# Patient Record
Sex: Female | Born: 1960 | Race: White | Hispanic: No | Marital: Married | Smoking: Never smoker
Health system: Southern US, Community
[De-identification: ages and names within clinical notes are randomized; demographics above are authoritative.]

## PROBLEM LIST (undated history)

## (undated) DIAGNOSIS — K219 Gastro-esophageal reflux disease without esophagitis: Secondary | ICD-10-CM

## (undated) DIAGNOSIS — M720 Palmar fascial fibromatosis [Dupuytren]: Secondary | ICD-10-CM

## (undated) DIAGNOSIS — Z8679 Personal history of other diseases of the circulatory system: Secondary | ICD-10-CM

## (undated) DIAGNOSIS — E781 Pure hyperglyceridemia: Secondary | ICD-10-CM

## (undated) DIAGNOSIS — R011 Cardiac murmur, unspecified: Secondary | ICD-10-CM

## (undated) DIAGNOSIS — J302 Other seasonal allergic rhinitis: Secondary | ICD-10-CM

## (undated) DIAGNOSIS — I4891 Unspecified atrial fibrillation: Principal | ICD-10-CM

## (undated) DIAGNOSIS — G629 Polyneuropathy, unspecified: Secondary | ICD-10-CM

## (undated) DIAGNOSIS — F1910 Other psychoactive substance abuse, uncomplicated: Secondary | ICD-10-CM

## (undated) DIAGNOSIS — T4145XA Adverse effect of unspecified anesthetic, initial encounter: Secondary | ICD-10-CM

## (undated) DIAGNOSIS — F419 Anxiety disorder, unspecified: Secondary | ICD-10-CM

## (undated) DIAGNOSIS — R569 Unspecified convulsions: Secondary | ICD-10-CM

## (undated) DIAGNOSIS — T8859XA Other complications of anesthesia, initial encounter: Secondary | ICD-10-CM

## (undated) DIAGNOSIS — G43909 Migraine, unspecified, not intractable, without status migrainosus: Secondary | ICD-10-CM

## (undated) DIAGNOSIS — I10 Essential (primary) hypertension: Secondary | ICD-10-CM

## (undated) HISTORY — DX: Essential (primary) hypertension: I10

## (undated) HISTORY — DX: Gastro-esophageal reflux disease without esophagitis: K21.9

## (undated) HISTORY — DX: Cardiac murmur, unspecified: R01.1

---

## 2000-05-14 HISTORY — PX: DILATION AND CURETTAGE OF UTERUS: SHX78

## 2009-02-25 ENCOUNTER — Ambulatory Visit: Payer: Self-pay | Admitting: Internal Medicine

## 2009-02-25 DIAGNOSIS — R011 Cardiac murmur, unspecified: Secondary | ICD-10-CM | POA: Insufficient documentation

## 2009-02-25 DIAGNOSIS — K219 Gastro-esophageal reflux disease without esophagitis: Secondary | ICD-10-CM | POA: Insufficient documentation

## 2009-02-28 ENCOUNTER — Telehealth: Payer: Self-pay | Admitting: Internal Medicine

## 2009-05-03 ENCOUNTER — Telehealth: Payer: Self-pay | Admitting: Internal Medicine

## 2009-05-04 ENCOUNTER — Ambulatory Visit: Payer: Self-pay | Admitting: Internal Medicine

## 2009-05-05 ENCOUNTER — Ambulatory Visit: Payer: Self-pay | Admitting: Internal Medicine

## 2009-05-07 ENCOUNTER — Telehealth: Payer: Self-pay | Admitting: Family Medicine

## 2009-05-10 ENCOUNTER — Telehealth: Payer: Self-pay | Admitting: Internal Medicine

## 2009-05-11 ENCOUNTER — Ambulatory Visit: Payer: Self-pay | Admitting: Cardiology

## 2009-05-11 ENCOUNTER — Telehealth: Payer: Self-pay | Admitting: Internal Medicine

## 2009-08-09 ENCOUNTER — Emergency Department (HOSPITAL_COMMUNITY): Admission: EM | Admit: 2009-08-09 | Discharge: 2009-08-09 | Payer: Self-pay | Admitting: Emergency Medicine

## 2009-08-11 ENCOUNTER — Telehealth: Payer: Self-pay | Admitting: Internal Medicine

## 2009-08-17 ENCOUNTER — Ambulatory Visit: Payer: Self-pay | Admitting: Internal Medicine

## 2009-08-17 DIAGNOSIS — L500 Allergic urticaria: Secondary | ICD-10-CM | POA: Insufficient documentation

## 2010-01-05 ENCOUNTER — Telehealth: Payer: Self-pay | Admitting: Internal Medicine

## 2010-01-05 DIAGNOSIS — N946 Dysmenorrhea, unspecified: Secondary | ICD-10-CM | POA: Insufficient documentation

## 2010-05-11 ENCOUNTER — Emergency Department (HOSPITAL_COMMUNITY)
Admission: EM | Admit: 2010-05-11 | Discharge: 2010-05-11 | Payer: Self-pay | Source: Home / Self Care | Admitting: Emergency Medicine

## 2010-05-12 ENCOUNTER — Ambulatory Visit
Admission: RE | Admit: 2010-05-12 | Discharge: 2010-05-12 | Payer: Self-pay | Source: Home / Self Care | Attending: Internal Medicine | Admitting: Internal Medicine

## 2010-05-12 DIAGNOSIS — R42 Dizziness and giddiness: Secondary | ICD-10-CM | POA: Insufficient documentation

## 2010-05-12 DIAGNOSIS — N926 Irregular menstruation, unspecified: Secondary | ICD-10-CM | POA: Insufficient documentation

## 2010-05-12 LAB — CONVERTED CEMR LAB
AST: 8 units/L (ref 0–37)
Alkaline Phosphatase: 122 units/L — ABNORMAL HIGH (ref 39–117)
Bilirubin, Direct: 0.1 mg/dL (ref 0.0–0.3)
Eosinophils Relative: 1.9 % (ref 0.0–5.0)
FSH: 48.9 milliintl units/mL
Lymphocytes Relative: 22.8 % (ref 12.0–46.0)
Lymphs Abs: 1.6 10*3/uL (ref 0.7–4.0)
MCHC: 33.9 g/dL (ref 30.0–36.0)
MCV: 89.7 fL (ref 78.0–100.0)
Neutro Abs: 4.7 10*3/uL (ref 1.4–7.7)
Neutrophils Relative %: 67.5 % (ref 43.0–77.0)
Potassium: 4.1 meq/L (ref 3.5–5.1)
RDW: 14.9 % — ABNORMAL HIGH (ref 11.5–14.6)
Sodium: 137 meq/L (ref 135–145)
Total Protein: 6.9 g/dL (ref 6.0–8.3)
Transferrin: 342.5 mg/dL (ref 212.0–360.0)

## 2010-05-15 ENCOUNTER — Encounter: Payer: Self-pay | Admitting: Internal Medicine

## 2010-05-22 ENCOUNTER — Ambulatory Visit: Admit: 2010-05-22 | Payer: Self-pay | Admitting: Internal Medicine

## 2010-05-22 ENCOUNTER — Telehealth: Payer: Self-pay | Admitting: Internal Medicine

## 2010-05-24 ENCOUNTER — Ambulatory Visit: Admit: 2010-05-24 | Payer: Self-pay | Admitting: Cardiology

## 2010-06-13 NOTE — Progress Notes (Signed)
Summary: GYN REFERRAL  Phone Note Call from Patient   Summary of Call: Patient is requesting a call regarding "female" problems.  Initial call taken by: Lamar Sprinkles, CMA,  January 05, 2010 4:28 PM  Follow-up for Phone Call        Pt c/o abnormal menstrual cycles. She needs referral to GYN and prefers a female.  Follow-up by: Lamar Sprinkles, CMA,  January 05, 2010 5:25 PM  New Problems: DYSMENORRHEA (ICD-625.3)   New Problems: DYSMENORRHEA (ICD-625.3)

## 2010-06-13 NOTE — Assessment & Plan Note (Signed)
Summary: POST ER /COULD'T DO SOONER APPTS/NWS   Vital Signs:  Patient profile:   50 year old female Menstrual status:  irregular Height:      67 inches (170.18 cm) Weight:      166 pounds (75.45 kg) BMI:     26.09 O2 Sat:      98 % on Room air Temp:     97.2 degrees F (36.22 degrees C) oral Pulse rate:   72 / minute Pulse rhythm:   regular Resp:     16 per minute BP sitting:   112 / 72  (left arm) Cuff size:   large  Vitals Entered By: Brenton Grills (August 17, 2009 4:05 PM)  O2 Flow:  Room air CC: pt here for post er follow up visit/aj, Abdominal Pain   Primary Care Provider:  [name] MD  CC:  pt here for post er follow up visit/aj and Abdominal Pain.  History of Present Illness: She returns for f/up after going to the ER for hives that she thinks were caused by stress from her son. She was placed on Benadryl and Pepcid and she feels much better.  Dyspepsia History:      She has no alarm features of dyspepsia including no history of melena, hematochezia, dysphagia, persistent vomiting, or involuntary weight loss > 5%.  There is a prior history of GERD.     Preventive Screening-Counseling & Management  Alcohol-Tobacco     Alcohol drinks/day: <1     Alcohol type: beer     >5/day in last 3 mos: no     Alcohol Counseling: not indicated; use of alcohol is not excessive or problematic     Feels need to cut down: no     Feels annoyed by complaints: no     Feels guilty re: drinking: no     Needs 'eye opener' in am: no     Smoking Status: never  Current Medications (verified): 1)  Prevacid  Allergies (verified): No Known Drug Allergies  Past History:  Past Medical History: Reviewed history from 02/25/2009 and no changes required. GERD Murmur  Past Surgical History: Reviewed history from 02/25/2009 and no changes required. Denies surgical history  Family History: Reviewed history from 02/25/2009 and no changes required. Family History  Hypertension Family History of Stroke F 1st degree relative <60  Social History: Reviewed history from 02/25/2009 and no changes required. Occupation: server Married Never Smoked Alcohol use-yes Drug use-no Regular exercise-yes  Review of Systems  The patient denies anorexia, fever, weight loss, weight gain, chest pain, abdominal pain, suspicious skin lesions, difficulty walking, depression, enlarged lymph nodes, and angioedema.    Physical Exam  General:  alert, well-developed, well-nourished, well-hydrated, appropriate dress, normal appearance, cooperative to examination, and good hygiene.   Head:  normocephalic, atraumatic, no abnormalities observed, and no abnormalities palpated.   Mouth:  Oral mucosa and oropharynx without lesions or exudates.  Teeth in good repair. Neck:  supple, full ROM, and no masses.   Lungs:  Normal respiratory effort, chest expands symmetrically. Lungs are clear to auscultation, no crackles or wheezes. Heart:  Normal rate and regular rhythm. S1 and S2 normal without gallop, murmur, click, rub or other extra sounds. Abdomen:  soft, non-tender, normal bowel sounds, no distention, no masses, no hepatomegaly, and no splenomegaly.   Msk:  normal ROM, no joint tenderness, no joint swelling, no joint warmth, no redness over joints, no joint deformities, no joint instability, and no crepitation.   Pulses:  R and L carotid,radial,femoral,dorsalis pedis and posterior tibial pulses are full and equal bilaterally Extremities:  No clubbing, cyanosis, edema, or deformity noted with normal full range of motion of all joints.   Neurologic:  No cranial nerve deficits noted. Station and gait are normal. Plantar reflexes are down-going bilaterally. DTRs are symmetrical throughout. Sensory, motor and coordinative functions appear intact. Skin:  right buttocks, adjacent to the IT shows a 3 cm area of erythema, warmth, ttp with a central area of  fluctuance/induration/excoriation Cervical Nodes:  No lymphadenopathy noted Axillary Nodes:  no R axillary adenopathy and no L axillary adenopathy.   Inguinal Nodes:  no R inguinal adenopathy and no L inguinal adenopathy.   Psych:  Cognition and judgment appear intact. Alert and cooperative with normal attention span and concentration. No apparent delusions, illusions, hallucinations   Impression & Recommendations:  Problem # 1:  ALLERGIC URTICARIA (ICD-708.0) Assessment Improved  Problem # 2:  GERD (ICD-530.81) Assessment: Improved  Complete Medication List: 1)  Prevacid   Patient Instructions: 1)  Avoid foods high in acid (tomatoes, citrus juices, spicy foods). Avoid eating within two hours of lying down or before exercising. Do not over eat; try smaller more frequent meals. Elevate head of bed twelve inches when sleeping. 2)  It is important that you exercise regularly at least 20 minutes 5 times a week. If you develop chest pain, have severe difficulty breathing, or feel very tired , stop exercising immediately and seek medical attention. 3)  You need to lose weight. Consider a lower calorie diet and regular exercise.

## 2010-06-13 NOTE — Progress Notes (Signed)
Summary: prednisone  Phone Note Call from Patient Call back at [phone]   Summary of Call: Patient was seen in the ER recently and given Prednisone. Patient has one more day of the med but complains that it keeps her up all night. Please advse. Initial call taken by: Lucious Groves,  August 11, 2009 2:25 PM  Follow-up for Phone Call        take melatonin at bedtime Follow-up by: [name] MD,  August 11, 2009 2:26 PM  Additional Follow-up for Phone Call Additional follow up Details #1::        left message on voicemail to call back to office. Additional Follow-up by: Lucious Groves,  August 11, 2009 3:32 PM    Additional Follow-up for Phone Call Additional follow up Details #2::    Patient notified. Follow-up by: Lucious Groves,  August 11, 2009 4:44 PM

## 2010-06-15 NOTE — Progress Notes (Signed)
Summary: APPT?  Phone Note Call from Patient   Caller: Patient Call For: [name] MD Summary of Call: Pt received letter concerning her 05/22/10 labs, which requested pt to sched appt.  Pt is asking do she need appt and if so why.  Pt ph#:  [phone] Initial call taken by: Ivar Bury,  May 22, 2010 12:02 PM  Follow-up for Phone Call        does she want to start treatment for menopause? Follow-up by: [name] MD,  May 22, 2010 12:58 PM  Additional Follow-up for Phone Call Additional follow up Details #1::        Patient spoke with MD at husbands appt.Marland KitchenMarland KitchenAlvy Beal Archie CMA  May 23, 2010 1:09 PM

## 2010-06-15 NOTE — Letter (Signed)
Summary: Results Follow-up Letter  [facility]  [address] [place], [place] [address]   Phone: [phone]  Fax: [phone]    05/15/2010  [address] [place], [place]  [address]  Dear Ms. Edwin Cap,   The following are the results of your recent test(s):  Test     Result     Hormones     positive for menopause Iron       "low normal" CBC       normal Liver/kidney   normal   _________________________________________________________  Please call for an appointment soon _________________________________________________________ _________________________________________________________ _________________________________________________________  Sincerely,  Sanda Linger MD [facility]

## 2010-06-15 NOTE — Assessment & Plan Note (Signed)
Summary: ER FU/EYE AND EAR PAIN/NWS   Vital Signs:  Patient profile:   50 year old female Menstrual status:  irregular LMP:     03/28/2010 Height:      67 inches (170.18 cm) Weight:      157.25 pounds (71.48 kg) BMI:     24.72 O2 Sat:      97 % on Room air Temp:     98.8 degrees F (37.11 degrees C) oral Pulse rate:   77 / minute Pulse rhythm:   regular Resp:     16 per minute BP sitting:   128 / 82  (left arm) Cuff size:   regular  Vitals Entered By: Brenton Grills CMA Duncan Dull) (May 12, 2010 3:41 PM)  O2 Flow:  Room air CC: Post ER F/U, L ear pain x 2 days, HA, Abdominal Pain Is Patient Diabetic? No Pain Assessment Patient in pain? no      LMP (date): 03/28/2010     Enter LMP: 03/28/2010   Primary Care Anna-Marie Coller:  [name] MD  CC:  Post ER F/U, L ear pain x 2 days, HA, and Abdominal Pain.  History of Present Illness: She returns for f/up after a recent ER visit and tells me that she has been feeling poorly lately and is concerned that she may have a low iron level or be in menopause. She has had mood swings, hot flashes, dizziness, and night sweats. Her testing in the ER was normal. She tells me that she wants to see a cardiologist.  Dyspepsia History:      She has no alarm features of dyspepsia including no history of melena, hematochezia, dysphagia, persistent vomiting, or involuntary weight loss > 5%.  There is a prior history of GERD.  The patient does not have a prior history of documented ulcer disease.  The dominant symptom is heartburn or acid reflux.  An H-2 blocker medication is currently being taken.  She notes that the symptoms have improved with the H-2 blocker therapy.  Symptoms have not persisted after 4 weeks of H-2 blocker treatment.     Preventive Screening-Counseling & Management  Alcohol-Tobacco     Alcohol drinks/day: <1     Alcohol type: beer     >5/day in last 3 mos: no     Alcohol Counseling: not indicated; use of alcohol is not  excessive or problematic     Feels need to cut down: no     Feels annoyed by complaints: no     Feels guilty re: drinking: no     Needs 'eye opener' in am: no     Smoking Status: never     Tobacco Counseling: not indicated; no tobacco use  Hep-HIV-STD-Contraception     Hepatitis Risk: no risk noted     HIV Risk: no risk noted     STD Risk: no risk noted      Sexual History:  currently monogamous.        Drug Use:  never and no.    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 Family History Hypertension Family History of Stroke F 1st degree relative <60  Social History: Reviewed history from 02/25/2009 and no changes required. 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   CV:  Complains of lightheadness; denies chest pain or discomfort, difficulty  breathing at night, difficulty breathing while lying down, fainting, fatigue, leg cramps with exertion, near fainting, palpitations, shortness of breath with exertion, and swelling of feet. GU:  Complains of decreased libido; denies abnormal vaginal bleeding, discharge, dysuria, hematuria, incontinence, nocturia, urinary frequency, and urinary hesitancy. Heme:  Denies abnormal bruising, bleeding, enlarge lymph nodes, fevers, pallor, and skin discoloration.  Physical Exam  General:  alert, well-developed, well-nourished, well-hydrated, appropriate dress, normal appearance, cooperative to examination, and good hygiene.   Head:  normocephalic, atraumatic, no abnormalities observed, and no abnormalities palpated.   Eyes:  no icterus Ears:  R ear normal and L ear normal.   Mouth:  Oral mucosa and oropharynx without lesions or exudates.  Teeth in good repair. Neck:  supple, full ROM, and no masses.   Lungs:  Normal respiratory effort, chest expands symmetrically. Lungs are clear to auscultation, no crackles or wheezes. Heart:  normal rate, regular rhythm, no gallop, no rub, and Grade   1/6 systolic ejection murmur.   Abdomen:  soft, non-tender, normal bowel sounds, no distention, no masses, no hepatomegaly, and no splenomegaly.   Msk:  normal ROM, no joint tenderness, no joint swelling, no joint warmth, no redness over joints, no joint deformities, no joint instability, and no crepitation.   Pulses:  R and L carotid,radial,femoral,dorsalis pedis and posterior tibial pulses are full and equal bilaterally Extremities:  No clubbing, cyanosis, edema, or deformity noted with normal full range of motion of all joints.   Neurologic:  No cranial nerve deficits noted. Station and gait are normal. Plantar reflexes are down-going bilaterally. DTRs are symmetrical throughout. Sensory, motor and coordinative functions appear intact. Skin:  turgor normal, color normal, no rashes, no suspicious lesions, no ecchymoses,  no petechiae, and no purpura.   Cervical Nodes:  No lymphadenopathy noted Axillary Nodes:  no R axillary adenopathy and no L axillary adenopathy.   Inguinal Nodes:  no R inguinal adenopathy and no L inguinal adenopathy.   Psych:  Cognition and judgment appear intact. Alert and cooperative with normal attention span and concentration. No apparent delusions, illusions, hallucinations   Impression & Recommendations:  Problem # 1:  IRREGULAR MENSES (ICD-626.4) Assessment New  Orders: Venipuncture (16109) TLB-BMP (Basic Metabolic Panel-BMET) (80048-METABOL) TLB-CBC Platelet - w/Differential (85025-CBCD) TLB-Hepatic/Liver Function Pnl (80076-HEPATIC) TLB-TSH (Thyroid Stimulating Hormone) (84443-TSH) TLB-B12 + Folate Pnl (60454_09811-B14/NWG) TLB-IBC Pnl (Iron/FE;Transferrin) (83550-IBC) TLB-FSH (Follicle Stimulating Hormone) (83001-FSH) TLB-Luteinizing Hormone (LH) (83002-LH)  Problem # 2:  DIZZINESS (ICD-780.4) Assessment: New  Orders: Venipuncture (95621) TLB-BMP (Basic Metabolic Panel-BMET) (80048-METABOL) TLB-CBC Platelet - w/Differential (85025-CBCD) TLB-Hepatic/Liver Function Pnl (80076-HEPATIC) TLB-TSH (Thyroid Stimulating Hormone) (84443-TSH) TLB-B12 + Folate Pnl (30865_78469-G29/BMW) TLB-IBC Pnl (Iron/FE;Transferrin) (83550-IBC) TLB-FSH (Follicle Stimulating Hormone) (83001-FSH) TLB-Luteinizing Hormone (LH) (83002-LH)  Problem # 3:  GERD (ICD-530.81) Assessment: Improved  Problem # 4:  CARDIAC MURMUR, SYSTOLIC (ICD-785.2) Assessment: Unchanged  Orders: Cardiology Referral (Cardiology)  Complete Medication List: 1)  Prevacid   Patient Instructions: 1)  Please schedule a follow-up appointment in 2 months. 2)  Avoid foods high in acid (tomatoes, citrus juices, spicy foods). Avoid eating within two hours of lying down or before exercising. Do not over eat; try smaller more frequent meals. Elevate head of bed twelve inches when sleeping. 3)  It is important that you  exercise regularly at least 20 minutes 5 times a week. If you develop chest pain, have severe difficulty breathing, or feel very tired , stop exercising immediately and seek medical attention. 4)  You need to lose weight. Consider a lower calorie diet and regular exercise.    Orders Added: 1)  Venipuncture [36415] 2)  TLB-BMP (Basic Metabolic Panel-BMET) [80048-METABOL] 3)  TLB-CBC Platelet - w/Differential [85025-CBCD] 4)  TLB-Hepatic/Liver Function Pnl [80076-HEPATIC] 5)  TLB-TSH (Thyroid Stimulating Hormone) [84443-TSH] 6)  TLB-B12 + Folate Pnl [82746_82607-B12/FOL] 7)  TLB-IBC Pnl (Iron/FE;Transferrin) [83550-IBC] 8)  TLB-FSH (Follicle Stimulating Hormone) [83001-FSH] 9)  TLB-Luteinizing Hormone (LH) [83002-LH] 10)  Cardiology Referral [Cardiology] 11)  Est. Patient Level IV [16109]

## 2010-06-19 ENCOUNTER — Ambulatory Visit (INDEPENDENT_AMBULATORY_CARE_PROVIDER_SITE_OTHER): Payer: BC Managed Care – PPO | Admitting: Cardiology

## 2010-06-19 ENCOUNTER — Encounter: Payer: Self-pay | Admitting: Cardiology

## 2010-06-19 DIAGNOSIS — F419 Anxiety disorder, unspecified: Secondary | ICD-10-CM | POA: Insufficient documentation

## 2010-06-19 DIAGNOSIS — R072 Precordial pain: Secondary | ICD-10-CM

## 2010-06-19 DIAGNOSIS — R42 Dizziness and giddiness: Secondary | ICD-10-CM

## 2010-06-19 DIAGNOSIS — R0789 Other chest pain: Secondary | ICD-10-CM | POA: Insufficient documentation

## 2010-06-19 DIAGNOSIS — F411 Generalized anxiety disorder: Secondary | ICD-10-CM | POA: Insufficient documentation

## 2010-06-21 ENCOUNTER — Other Ambulatory Visit: Payer: BC Managed Care – PPO

## 2010-06-26 ENCOUNTER — Encounter: Payer: Self-pay | Admitting: Physician Assistant

## 2010-06-26 ENCOUNTER — Encounter (INDEPENDENT_AMBULATORY_CARE_PROVIDER_SITE_OTHER): Payer: BC Managed Care – PPO | Admitting: Physician Assistant

## 2010-06-26 DIAGNOSIS — I10 Essential (primary) hypertension: Secondary | ICD-10-CM | POA: Insufficient documentation

## 2010-06-29 NOTE — Assessment & Plan Note (Signed)
Summary: np6. cardiac murmur.pt has bcbs.gd/ per deborah from [facility] off...   Visit Type:  Initial Consult Primary Provider:  [name] MD  CC:  chest tightness.  History of Present Illness: The patient presents for evaluation of chest pain.  She was in the ER in late December with some vague dizziness and chest discomfort. She had negative cardiac markers and was sent home. She has had no prior cardiac history although she has a very strong family history. She has had no prior cardiovascular testing. I did review recent blood work including a Forensic scientist and TSH as well as her ER notes. Since that ER visit she has otherwise done relatively well and has had no further dizziness presyncope or syncope. She has been told she had a heart murmur although this was reported as 1/6. She has never had any evaluation of this though it has been mentioned in the past. She has had some headaches. She has had no chest pressure, neck or arm discomfort. She has no shortness of breath, PND or orthopnea.  Current Medications (verified): 1)  Prevacid 2)  Ibuprofen 200 Mg Tabs (Ibuprofen) .... 3 Tabs Daily For Headaches 3)  Ferrous Sulfate 325 (65 Fe) Mg  Tabs (Ferrous Sulfate) .... Once Daily 4)  Multivitamins   Tabs (Multiple Vitamin) .... Once Daily  Allergies (verified): No Known Drug Allergies  Past History:  Past Medical History: Reviewed history from 02/25/2009 and no changes required. GERD Murmur  Past Surgical History: None  Family History: Family History Hypertension Family History of Stroke F 1st degree relative <60 Father with CHF age 73 Mother died with CVAs/MIs died age 53 Sister with MI age 41  Social History: Reviewed history from 02/25/2009 and no changes required. 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   Vital Signs:  Patient profile:    50 year old female Menstrual status:  irregular Height:      67 inches Weight:      157 pounds BMI:     24.68 Pulse rate:   79 / minute BP sitting:   172 / 96  (left arm) Cuff size:   regular  Vitals Entered By: Hardin Negus, RMA (June 19, 2010 4:40 PM)  Physical Exam  General:  Well developed, well nourished, in no acute distress. Head:  normocephalic and atraumatic Eyes:  PERRLA/EOM intact; conjunctiva and lids normal. Mouth:  Teeth, gums and palate normal. Oral mucosa normal. Neck:  Neck supple, no JVD. No masses, thyromegaly or abnormal cervical nodes. Lungs:  Clear bilaterally to auscultation and percussion. Abdomen:  Bowel sounds positive; abdomen soft and non-tender without masses, organomegaly, or hernias noted. No hepatosplenomegaly. Msk:  Back normal, normal gait. Muscle strength and tone normal. Extremities:  No clubbing or cyanosis. Neurologic:  Alert and oriented x 3. Skin:  Intact without lesions or rashes. Cervical Nodes:  no significant adenopathy Axillary Nodes:  no significant adenopathy Inguinal Nodes:  no significant adenopathy Psych:  Normal affect.   Detailed Cardiovascular Exam  Neck    Carotids: Carotids full and equal bilaterally without bruits.      Neck Veins: Normal, no JVD.    Heart    Inspection: no deformities or lifts noted.      Palpation: normal PMI with no thrills palpable.      Auscultation: regular rate and rhythm, S1, S2 without  murmurs, rubs, gallops, or clicks.    Vascular    Abdominal Aorta: no palpable masses, pulsations, or audible bruits.      Femoral Pulses: normal femoral pulses bilaterally.      Pedal Pulses: normal pedal pulses bilaterally.      Radial Pulses: normal radial pulses bilaterally.      Peripheral Circulation: no clubbing, cyanosis, or edema noted with normal capillary refill.     EKG  Procedure date:  06/19/2010  Findings:      sinus rhythm, rate 79, axis within normal limits, intervals within  normal, no ST-T wave changes.  Impression & Recommendations:  Problem # 1:  CARDIAC MURMUR, SYSTOLIC (ICD-785.2) I actually do not appreciate a murmur today. He has been described as solid and could represent a flow murmur. I do not think further cardiovascular testing to evaluate this he suggested as she is asymptomatic.  Problem # 2:  CHEST DISCOMFORT (ICD-786.59) She has had some vague chest discomfort. She has a significant family history of early cardiovascular disease. I do not think the pretest probability of obstructive coronary disease is high but I would suggest screening with an exercise treadmill test. I have also suggested a fasting lipid profile.  Problem # 3:  ANXIETY STATE, UNSPECIFIED (ICD-300.00) The patient has a 31 year old son and a job that causes anxiety.  We discussed this at length.  At her treadmill I will prescribe exercise.  Other Orders: Treadmill (Treadmill)  Patient Instructions: 1)  Your physician recommends that you schedule a follow-up appointment as needed 2)  Your physician recommends that you return for a FASTING lipid profile: To be done at [facility] office--780.4 3)  Your physician recommends that you continue on your current medications as directed. Please refer to the Current Medication list given to you today. 4)  Your physician has requested that you have an exercise tolerance test.  For further information please visit [email or web address]  Please also follow instruction sheet, as given. 5)  Keep record of blood pressures.

## 2010-07-03 ENCOUNTER — Other Ambulatory Visit: Payer: BC Managed Care – PPO

## 2010-07-10 ENCOUNTER — Encounter (INDEPENDENT_AMBULATORY_CARE_PROVIDER_SITE_OTHER): Payer: BC Managed Care – PPO | Admitting: Physician Assistant

## 2010-07-10 ENCOUNTER — Ambulatory Visit (INDEPENDENT_AMBULATORY_CARE_PROVIDER_SITE_OTHER): Payer: BC Managed Care – PPO | Admitting: Physician Assistant

## 2010-07-10 ENCOUNTER — Encounter: Payer: Self-pay | Admitting: Physician Assistant

## 2010-07-10 ENCOUNTER — Inpatient Hospital Stay: Admission: AD | Admit: 2010-07-10 | Payer: Self-pay | Source: Ambulatory Visit | Admitting: Cardiology

## 2010-07-10 ENCOUNTER — Other Ambulatory Visit (INDEPENDENT_AMBULATORY_CARE_PROVIDER_SITE_OTHER): Payer: BC Managed Care – PPO

## 2010-07-10 ENCOUNTER — Telehealth: Payer: Self-pay | Admitting: Physician Assistant

## 2010-07-10 DIAGNOSIS — I10 Essential (primary) hypertension: Secondary | ICD-10-CM

## 2010-07-10 DIAGNOSIS — R0989 Other specified symptoms and signs involving the circulatory and respiratory systems: Secondary | ICD-10-CM

## 2010-07-10 DIAGNOSIS — R079 Chest pain, unspecified: Secondary | ICD-10-CM

## 2010-07-11 ENCOUNTER — Encounter (INDEPENDENT_AMBULATORY_CARE_PROVIDER_SITE_OTHER): Payer: Self-pay | Admitting: *Deleted

## 2010-07-11 ENCOUNTER — Encounter: Payer: Self-pay | Admitting: Physician Assistant

## 2010-07-13 ENCOUNTER — Other Ambulatory Visit: Payer: Self-pay | Admitting: Cardiology

## 2010-07-13 ENCOUNTER — Other Ambulatory Visit (INDEPENDENT_AMBULATORY_CARE_PROVIDER_SITE_OTHER): Payer: BC Managed Care – PPO

## 2010-07-13 ENCOUNTER — Encounter: Payer: Self-pay | Admitting: Cardiology

## 2010-07-13 DIAGNOSIS — K921 Melena: Secondary | ICD-10-CM | POA: Insufficient documentation

## 2010-07-13 DIAGNOSIS — I10 Essential (primary) hypertension: Secondary | ICD-10-CM | POA: Insufficient documentation

## 2010-07-13 LAB — BASIC METABOLIC PANEL
Creatinine, Ser: 0.7 mg/dL (ref 0.4–1.2)
GFR: 91.34 mL/min (ref >60.00)
Glucose, Bld: 87 mg/dL (ref 70–99)
Potassium: 3.4 mEq/L — ABNORMAL LOW (ref 3.5–5.1)
Sodium: 134 mEq/L — ABNORMAL LOW (ref 135–145)

## 2010-07-13 LAB — CBC WITH DIFFERENTIAL/PLATELET
Basophils Absolute: 0 10*3/uL (ref 0.0–0.1)
Basophils Relative: 0.5 % (ref 0.0–3.0)
Lymphocytes Relative: 22.3 % (ref 12.0–46.0)
Lymphs Abs: 1.5 10*3/uL (ref 0.7–4.0)
MCHC: 34.6 g/dL (ref 30.0–36.0)
MCV: 90.7 fl (ref 78.0–100.0)
Monocytes Absolute: 0.6 10*3/uL (ref 0.1–1.0)
RBC: 4.24 Mil/uL (ref 3.87–5.11)
RDW: 14.8 % — ABNORMAL HIGH (ref 11.5–14.6)

## 2010-07-13 LAB — APTT: aPTT: 25 s (ref 21.7–28.8)

## 2010-07-13 LAB — PROTIME-INR: Prothrombin Time: 11.1 s (ref 10.2–12.4)

## 2010-07-14 ENCOUNTER — Inpatient Hospital Stay (HOSPITAL_BASED_OUTPATIENT_CLINIC_OR_DEPARTMENT_OTHER)
Admission: RE | Admit: 2010-07-14 | Discharge: 2010-07-14 | Disposition: A | Discharge status: Home / Self Care | Payer: BC Managed Care – PPO | Source: Ambulatory Visit | Attending: Cardiology | Admitting: Cardiology

## 2010-07-14 DIAGNOSIS — I2510 Atherosclerotic heart disease of native coronary artery without angina pectoris: Secondary | ICD-10-CM

## 2010-07-14 DIAGNOSIS — R079 Chest pain, unspecified: Secondary | ICD-10-CM | POA: Insufficient documentation

## 2010-07-14 HISTORY — PX: CARDIAC CATHETERIZATION: SHX172

## 2010-07-20 NOTE — Progress Notes (Signed)
Summary: chest tightness  Phone Note Call from Patient Call back at Home Phone [phone]   Caller: Patient Summary of Call: Pt having chest tightness due to stress want to know if she should still do treadmill test Initial call taken by: Judie Grieve,  July 10, 2010 11:54 AM  Follow-up for Phone Call        Spoke with pt who states that she has been having chest  tightness today off and on for about 2 or 3 hours.  States she has been working in the house and hasn't stopped therefore she doesn't know if the discomfort would get better with rest.  She states she is under alot of stress due to something pretty serious that happened with her son this weekend.  She is tearful.  She denied any radiation, sob, n/v or  pain.  States she "can tell" her BP is elevated though she has not taken it.  Asked pt to rest to see if the chest pressure gets better.  SHe should keep her appt today for the GXT as scheduled unless her s/s change or worsen.  Pt is in agreement and will call back if anything changes. Follow-up by: Charolotte Capuchin, RN,  July 10, 2010 12:04 PM

## 2010-07-20 NOTE — Letter (Signed)
Summary: Cardiac Catheterization Instructions- [facility]  [employer], Main Office  [address]   [place], [place] [address]   Phone: [phone]  Fax: [phone]     07/11/2010 MRN: [id number]  Christine Garcia [address] [place], [place]  [address]  [place]  Dear Ms. MCEVOY,   You are scheduled for a Cardiac Catheterization on 07/14/10 (Friday) with Dr.Hochrein.  Please arrive to the 1st floor of the Heart and Vascular Center at [facility] at 11:30 am on the day of your procedure. Please do not arrive before 6:30 a.m. Call the Heart and Vascular Center at [phone] if you are unable to make your appointmnet. The Code to get into the parking garage under the building is 0300. Take the elevators to the 1st floor. You must have someone to drive you home. Someone must be with you for the first 24 hours after you arrive home. Please wear clothes that are easy to get on and off and wear slip-on shoes. Do not eat or drink after midnight except water with your medications that morning. Bring all your medications and current insurance cards with you.  XXX Make sure you take your aspirin 81 mg .  XXX You may take ALL of your medications with water that morning.  The usual length of stay after your procedure is 2 to 3 hours. This can vary.  If you have any questions, please call the office at the number listed above.   Danielle Rankin, CMA Tereso Newcomer, PA-C

## 2010-07-20 NOTE — Assessment & Plan Note (Signed)
Summary: pt added on to schedule...ok as per SW...Christine Garcia   Visit Type:  Follow-up Primary Provider:  [name] Garcia  CC:  chest tightness and sob.  History of Present Illness: Primary Cardiologist:  Christine Garcia  Christine Garcia is a 50 yo female with a h/o GERD who saw Christine Garcia recently for chest discomfort and dizziness.  She was set up for a routine treadmill test.  When she presented for this a couple of weeks ago, her blood pressure was elevated and we decided to postpone.  I placed her on Maxzide and she came back today for her treadmill test.  She called the office today with complaints of chest tightness.  She states that she's never had this before.  It started while she was cleaning her house.  It continued for a long time and finally subsided.  She's had some tightness in her left arm in the office since she's been here.  She really denies any significant shortness of breath with her discomfort.  She denies associated nausea or diaphoresis or syncope or near-syncope.  She denies orthopnea, PND or pedal edema.  She denies any dysphagia, water brash symptoms or relation to meals.  She denies pleuritic symptoms.  She denies hemoptysis.  Her blood pressure was significantly elevated when she presented to the treadmill room and her test was again postponed.  Current Medications (verified): 1)  Prevacid 2)  Ibuprofen 200 Mg Tabs (Ibuprofen) .... 3 Tabs Daily For Headaches 3)  Ferrous Sulfate 325 (65 Fe) Mg  Tabs (Ferrous Sulfate) .... Once Daily 4)  Multivitamins   Tabs (Multiple Vitamin) .... Once Daily 5)  Maxzide-25 37.5-25 Mg Tabs (Triamterene-Hctz) .... Take 1 Tablet By Mouth Once A Day  Allergies (verified): 1)  ! Pcn  Past History:  Past Medical History: Last updated: 02/25/2009 GERD Murmur  Past Surgical History: Last updated: 06/19/2010 None  Social History: Last updated: 02/25/2009 Occupation: server Married Never Smoked Alcohol use-yes Drug  use-no Regular exercise-yes  Family History: Reviewed history from 06/19/2010 and no changes required. Family History Hypertension Family History of Stroke F 1st degree relative <60 Father with CHF age 16 Mother died with CVAs/MIs died age 36 Sister with MI age 73  Review of Systems       As per  the HPI.  All other systems reviewed and negative.   Vital Signs:  Patient profile:   50 year old female Menstrual status:  irregular Height:      67 inches Weight:      152 pounds Pulse rate:   72 / minute Pulse rhythm:   regular BP sitting:   172 / 101  (left arm)  Vitals Entered By: Jacquelin Hawking, CMA (July 10, 2010 4:18 PM)  Physical Exam  General:  Well nourished, well developed in no acute distress HEENT: Normal Neck: No JVD Cardiac:  Normal S1, S2; RRR; no murmur Lungs:  Clear to auscultation bilaterally, no wheezing, rhonchi or rales Abd: Soft, nontender, no hepatomegaly Ext: No edema Vascular: No carotid  bruits; DP/PT intact Skin: Warm and dry MSK:  No deformities Lymph: No adenopathy Endocrine:  No thyromegaly Neuro: CNs 2-12 intact; nonfocal    EKG  Procedure date:  07/10/2010  Findings:      NSR HR 80 normal axis ST flattening in leads 2, 3, avF, V4-6 no significant change since previous tracing  Impression & Recommendations:  Problem # 1:  CHEST DISCOMFORT (ICD-786.59) Symptoms are concerning and she had symptoms in the  office today.  She has CRFs that include HTN and significant FHx.  We have suggested admission to the hospital for evaluation.  Dr. Jens Garcia also spoke with the patient today.  We feel she should be r/o for MI and plan on cardiac cath tomorrow.  She refuses admission.  Dr. Jens Garcia discussed with her the risks of leaving AMA and these include suffering a myocardial infarction and death.  She understands this and is willing to accept those risks.  She will sign an AMA form and this will be scanned in to her chart.  She is willing to  arrange an outpatient heart cath.  This will be arranged this week.  She will return for labs tomorrow. She has been apprised of the risks of heart catheterization.  Risks include but are not limited to death, heart attack, stroke, bleeding, infection or allergic reaction from the dye.  The patient is willing to accept these risks and proceed with catheterization.   Orders: EKG w/ Interpretation (93000)  Problem # 2:  HYPERTENSION (ICD-401.9) Uncontrolled. Add carvedilol 6.25 mg two times a day.  Check bmet tomorrow to f/u on renal fxn and K+.  Problem # 3:  GERD (ICD-530.81) Continue Prevacid.  Problem # 4:  ANXIETY STATE, UNSPECIFIED (ICD-300.00)  Patient Instructions: 1)  Your physician has requested that you have a cardiac  catheterization 07/06/10.  Cardiac catheterization is used to diagnose and/or treat various heart conditions. Doctors may recommend this procedure for a number of different reasons. The most common reason is to evaluate chest pain. Chest pain can be a symptom of coronary artery disease (CAD), and cardiac catheterization can show whether plaque is narrowing or blocking your heart's arteries. This procedure is also used to evaluate the valves, as well as measure the blood flow and oxygen levels in different parts of your heart.  For further information please visit [email or web address]  Please follow instruction sheet, as given. 2)  Your physician has recommended you make the following change in your medication: START CARVEDILOL 6.25 MG 1/2 TABLET TWICE DAILY FOR THE FIRST DAY...THEN INCREASE TO 1 WHOLE TABLET TWICE DAILY. A NEW PRESCRIPTION HAS BEEN SENT TO [facility] ON [address] FOR YOU TODAY. 3)  Your physician recommends that you return for lab work in: 07/11/10 FOR PRE-CATH LAB WORK, PT, PTT, CBC, BMP, YOU CAN COME IN ANY TIME BETWEEN 8:30 AND 4:15. Prescriptions: CARVEDILOL 6.25 MG TABS (CARVEDILOL) 1/2 tablet two times a day for the first day...then increase to 1 tablet  two times a day  #60 x 5   Entered and Authorized by:   Tereso Newcomer PA-C   Signed by:   Tereso Newcomer PA-C on 07/10/2010   Method used:   Faxed to ...       [facility]  [address]  [phone]* (retail)       [address]       [place], [place]  [address]       Ph: [phone] or [phone]       Fax: [phone]   RxID:   [id number] CARVEDILOL 6.25 MG TABS (CARVEDILOL) 1/2 tablet two times a day for the first day...then increase to 1 tablet two times a day  #90 x 11   Entered by:   Danielle Rankin, CMA   Authorized by:   Tereso Newcomer PA-C   Signed by:   Danielle Rankin, CMA on 07/10/2010   Method used:   Electronically to  [facility]  [address]  [phone]* (retail)       [address]       [place], [place]  [address]       Ph: [phone] or [phone]       Fax: [phone]   RxID:   [id number]  I corrected disp # and called pharmacy. Tereso Newcomer PA-C  July 10, 2010 6:06 PM   Appended Document: pt added on to schedule...ok as per SW...Christine Garcia As noted above I discussed the patient's chest pain with her today. She had 1-1/2 hours of chest pain earlier this morning while doing housework. She feels it may be related to stress. However given her risk factors including strong family history I recommended admission for rule out myocardial infarction as well as catheterization. I explained the risks of the procedure. I also explained the risk of undiagnosed coronary disease including myocardial infarction and death. She refused admission today and understands the above risk. We will try and arrange an outpatient cardiac catheterization for definitive evaluation. I explained the risks of the procedure.

## 2010-07-20 NOTE — Miscellaneous (Addendum)
Summary: RX  Potassium Chloride 20 Meq  Clinical Lists Changes  Medications: Added new medication of POTASSIUM CHLORIDE CRYS CR 20 MEQ CR-TABS (POTASSIUM CHLORIDE CRYS CR) take 2 tablets tonight and one daily thereafter - Signed Rx of POTASSIUM CHLORIDE CRYS CR 20 MEQ CR-TABS (POTASSIUM CHLORIDE CRYS CR) take 2 tablets tonight and one daily thereafter;  #30 x 6;  Signed;  Entered by: Charolotte Capuchin, RN;  Authorized by: Rollene Rotunda, MD, [facility];  Method used: Electronically to [facility]  [phone]*, [address] [place], [place], [place]  [address], Ph: [phone] or [phone], Fax: [phone]    Prescriptions: POTASSIUM CHLORIDE CRYS CR 20 MEQ CR-TABS (POTASSIUM CHLORIDE CRYS CR) take 2 tablets tonight and one daily thereafter  #30 x 6   Entered by:   Charolotte Capuchin, RN   Authorized by:   Rollene Rotunda, MD, [facility]   Signed by:   Charolotte Capuchin, RN on 07/13/2010   Method used:   Electronically to        [facility]  [phone]* (retail)       [address]       [place], [place]  [address]       Ph: [phone] or [phone]       Fax: [phone]   RxID:   [id number]

## 2010-07-20 NOTE — Miscellaneous (Signed)
Summary: med update  Clinical Lists Changes  Medications: Added new medication of ASPIRIN 81 MG TBEC (ASPIRIN) Take one tablet by mouth daily

## 2010-07-20 NOTE — Letter (Signed)
Summary: [employer], Main Office  [address]   [place], [place] [address]   Phone: [phone]  Fax: [phone]    Today's Date: July 10, 2010  Name of Patient: Christine Garcia  The above named patient had a medical visit today at:  am / 3 pm.  Please take this into consideration when reviewing the time away from work.    Special Instructions:  [  ] None  [  ] To be off the remainder of today, returning to the normal work / school schedule tomorrow.  [  ] To be off until the next scheduled appointment on ______________________.  [ x ] Other -The patient was instructed to be admitted to the hospital today, but she did not want to pursue this.    Sincerely yours,   Tereso Newcomer PA-C

## 2010-07-20 NOTE — Letter (Signed)
Summary: Cardiac Catheterization Instructions- [facility]  [facility] [employer], Main Office  [address]   [place], [place] [address]   Phone: [phone]  Fax: [phone]     07/11/2010 MRN: [id number]  Christine Garcia [address] [place], [place]  [address]  [place]  Dear Ms. SKLAR,   You are scheduled for a Cardiac Catheterization on 07/14/10 @ 12:30 pm with Dr.Hochrein.  Please arrive to the 1st floor of the Heart and Vascular Center at [facility] at 11:30 am on the day of your procedure. Please do not arrive before 6:30 a.m. Call the Heart and Vascular Center at [phone] if you are unable to make your appointmnet. The Code to get into the parking garage under the building is 0300. Take the elevators to the 1st floor. You must have someone to drive you home. Someone must be with you for the first 24 hours after you arrive home. Please wear clothes that are easy to get on and off and wear slip-on shoes. Do not eat or drink after midnight except water with your medications that morning. Bring all your medications and current insurance cards with you.  XXXXX Make sure you take your aspirin 81 mg .  XXXX You may take ALL of your medications with water that morning.  The usual length of stay after your procedure is 2 to 3 hours. This can vary.  If you have any questions, please call the office at the number listed above.   Danielle Rankin, CMA Tereso Newcomer, PA-C

## 2010-07-24 LAB — POCT I-STAT, CHEM 8
BUN: 14 mg/dL (ref 6–23)
Calcium, Ion: 1.1 mmol/L — ABNORMAL LOW (ref 1.12–1.32)
Chloride: 105 mEq/L (ref 96–112)
Creatinine, Ser: 0.8 mg/dL (ref 0.4–1.2)
Glucose, Bld: 91 mg/dL (ref 70–99)
HCT: 40 % (ref 36.0–46.0)
Hemoglobin: 13.6 g/dL (ref 12.0–15.0)
Potassium: 3.9 mEq/L (ref 3.5–5.1)
Sodium: 137 mEq/L (ref 135–145)
TCO2: 23 mmol/L (ref 0–100)

## 2010-07-24 LAB — POCT CARDIAC MARKERS
CKMB, poc: <1 ng/mL — ABNORMAL LOW (ref 1.0–8.0)
Myoglobin, poc: 36 ng/mL (ref 12–200)
Troponin i, poc: <0.05 ng/mL (ref 0.00–0.09)

## 2010-07-27 NOTE — Procedures (Signed)
  NAMELUN, MURO NO.:  [id number]  MEDICAL RECORD NO.:  [id number]           PATIENT TYPE:  LOCATION:                                 FACILITY:  PHYSICIAN:  Rollene Rotunda, MD, FACCDATE OF BIRTH:  08-14-1960  DATE OF PROCEDURE:  07/14/2010 DATE OF DISCHARGE:                           CARDIAC CATHETERIZATION   PRIMARY:  Sanda Linger, MD.  CARDIOLOGIST:  Rollene Rotunda, MD, [facility].  PROCEDURE:  Left heart catheterization/coronary arteriography.  INDICATIONS:  The patient with chest pain suggestive of unstable angina.  PROCEDURE NOTE:  Left heart catheterization was performed via the right femoral artery.  The artery was cannulated using the anterior wall puncture.  A #4-French arterial sheath was inserted via the modified Seldinger technique.  Preformed Judkins and pigtail catheter were utilized.  The patient tolerated the procedure well and left the lab in stable condition.  RESULTS:  Hemodynamics:  LV 157/15, AO 158/114.  Coronaries, left main was normal.  The LAD had mid long 25% stenosis.  It wrapped the apex. However, as with all of her other vessels, these were somewhat narrow caliber vessels.  The circumflex in the AV groove was normal.  A ramus intermediate was large with ostial 25% stenosis.  The right coronary artery was a dominant vessel.  There was proximal 30% stenosis which might have been related to spasm.  The PDA was small-to-moderate size and normal throughout.  Left ventriculogram, the left ventriculogram was obtained in the RAO projection.  The EF is 65% with normal wall motion.  CONCLUSION:  Nonobstructive coronary artery disease.  There was some suggestion of coronary spasm.  She does have narrow caliber vessels. However, there is clearly nothing needs to be fixed mechanically.  She will be managed medically for probable nonanginal chest pain as there is no objective evidence of ischemia.  If there is no other  etiology identified, we could consider further management for possible spasm with nitrates and calcium channel blockers going forward     Rollene Rotunda, MD, [facility]   ______________________________ Rollene Rotunda, MD, [facility]    JH/MEDQ  D:  07/14/2010  T:  07/15/2010  Job:  [id number]  cc:   Sanda Linger, MD  Electronically Signed by Rollene Rotunda MD [facility] on 07/27/2010 07:59:22 PM

## 2010-08-01 NOTE — Letter (Signed)
Summary: Catering manager Cardiology Against Medical Advice   Leaving [facility] Cardiology Against Medical Advice   Imported By: Roderic Ovens 07/24/2010 09:27:14  _____________________________________________________________________  External Attachment:    Type:   Image     Comment:   External Document

## 2010-08-03 ENCOUNTER — Encounter: Payer: Self-pay | Admitting: Cardiology

## 2010-08-07 LAB — BASIC METABOLIC PANEL
Calcium: 8.8 mg/dL (ref 8.4–10.5)
GFR calc Af Amer: >60 mL/min (ref >60)
GFR calc non Af Amer: >60 mL/min (ref >60)
Potassium: 3.7 mEq/L (ref 3.5–5.1)

## 2010-08-07 LAB — POCT CARDIAC MARKERS
Myoglobin, poc: 53.5 ng/mL (ref 12–200)
Troponin i, poc: <0.05 ng/mL (ref 0.00–0.09)

## 2010-08-07 LAB — CBC
HCT: 26 % — ABNORMAL LOW (ref 36.0–46.0)
Hemoglobin: 8.3 g/dL — ABNORMAL LOW (ref 12.0–15.0)
MCHC: 31.9 g/dL (ref 30.0–36.0)
Platelets: 348 10*3/uL (ref 150–400)
RBC: 3.84 MIL/uL — ABNORMAL LOW (ref 3.87–5.11)
RDW: 18.5 % — ABNORMAL HIGH (ref 11.5–15.5)
WBC: 6.3 10*3/uL (ref 4.0–10.5)

## 2010-08-07 LAB — URINALYSIS, ROUTINE W REFLEX MICROSCOPIC
Glucose, UA: NEGATIVE mg/dL
Hgb urine dipstick: NEGATIVE
Ketones, ur: NEGATIVE mg/dL
Specific Gravity, Urine: 1.015 (ref 1.005–1.030)
pH: 5 (ref 5.0–8.0)

## 2010-08-07 LAB — DIFFERENTIAL
Blasts: 0 %
Eosinophils Absolute: 0 10*3/uL (ref 0.0–0.7)
Eosinophils Relative: 0 % (ref 0–5)
Lymphs Abs: 1.1 10*3/uL (ref 0.7–4.0)
Monocytes Relative: 2 % — ABNORMAL LOW (ref 3–12)
Neutro Abs: 5.1 10*3/uL (ref 1.7–7.7)
nRBC: 0 /100 WBC

## 2010-08-14 ENCOUNTER — Encounter: Payer: BC Managed Care – PPO | Admitting: Cardiology

## 2010-08-16 ENCOUNTER — Encounter: Payer: Self-pay | Admitting: Cardiology

## 2010-08-29 ENCOUNTER — Encounter: Payer: Self-pay | Admitting: Cardiology

## 2010-11-19 ENCOUNTER — Other Ambulatory Visit: Payer: Self-pay | Admitting: Physician Assistant

## 2011-03-14 ENCOUNTER — Other Ambulatory Visit: Payer: Self-pay | Admitting: Cardiology

## 2011-04-23 ENCOUNTER — Telehealth: Payer: Self-pay | Admitting: Internal Medicine

## 2011-04-23 NOTE — Telephone Encounter (Signed)
The pt's husband called and left a message with triage stated his wife had a temp of 99.0 and was feeling congested.  He was asking about what to do because the pt no longer has health insurance.  I told him the office visit is 125.00 without insurance. The pt's husband then stated he would try some over the counter remedies but would call back if the symptoms became worse.    Thanks!

## 2011-08-01 ENCOUNTER — Other Ambulatory Visit: Payer: Self-pay | Admitting: Physician Assistant

## 2011-08-21 ENCOUNTER — Telehealth: Payer: Self-pay | Admitting: Cardiology

## 2011-08-21 NOTE — Telephone Encounter (Signed)
Per husbands report - pt complaining of headaches and some right sided chest tightness.  He reports she is under a lot of stress and he would like for her to be seen.  He is concerned that her bp is elevated however it has not been checked.  Pt scheduled to be seen 4/11 at 3:30 with Ward Givens

## 2011-08-21 NOTE — Telephone Encounter (Signed)
PT HAVING HEADACHES /SOME CHEST PAIN/ ABOUT 2-3 DAYS NOW, PLS CALL HUSBAND MATTHEW [phone]

## 2011-08-23 ENCOUNTER — Encounter: Payer: Self-pay | Admitting: Nurse Practitioner

## 2011-08-23 ENCOUNTER — Ambulatory Visit (INDEPENDENT_AMBULATORY_CARE_PROVIDER_SITE_OTHER): Payer: Self-pay | Admitting: Nurse Practitioner

## 2011-08-23 VITALS — BP 120/86 | HR 67 | Ht 67.0 in | Wt 146.0 lb

## 2011-08-23 DIAGNOSIS — R079 Chest pain, unspecified: Secondary | ICD-10-CM | POA: Insufficient documentation

## 2011-08-23 DIAGNOSIS — I10 Essential (primary) hypertension: Secondary | ICD-10-CM

## 2011-08-23 NOTE — Progress Notes (Signed)
Patient Name: Christine Garcia Date of Encounter: 08/23/2011  Primary Care Provider:  Sanda Linger, MD, MD Primary Cardiologist:  Shela Commons. Hochrein, MD  Patient Profile  51 y/o female w/ h/o chest pain and nonobs cath in 2012 who presents with right sided c/p.  Problem List   Past Medical History  Diagnosis Date  . GERD (gastroesophageal reflux disease)   . Murmur   . Hypertension   . Chest pain at rest     a. 07/2010 Cath: nonobs dzs, EF 65%   Past Surgical History  Procedure Date  . None   . Cardiac catheterization 07/14/2010    Allergies  Allergies  Allergen Reactions  . Penicillins     HPI  51 y/o female with the above problem list.  She had c/p last year and underwent cath revealing nonobs dzs.  She did well following that.  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  This has helped some with the stress.  In the setting of this stress, she has noted increased freq of headaches (relieved with advil) along with intermittent right sided chest discomfort, which is reproducible with palpation.  Because of this discomfort, she made this appt today.  Home Medications  Prior to Admission medications   Medication Sig Start Date End Date Taking? Authorizing Provider  aspirin 81 MG tablet Take 81 mg by mouth daily.     Yes Historical Provider, MD  carvedilol (COREG) 6.25 MG tablet Take 1 tablet (6.25 mg total) by mouth 2 (two) times daily with a meal. 08/01/11  Yes Rollene Rotunda, MD  ferrous sulfate 325 (65 FE) MG tablet Take 325 mg by mouth daily.     Yes Historical Provider, MD  ibuprofen (ADVIL,MOTRIN) 200 MG tablet Take 200 mg by mouth. 3 tablets daily for headaches    Yes Historical Provider, MD  Lansoprazole (PREVACID 24HR PO) Take 1 capsule by mouth daily.     Yes Historical Provider, MD  Nutritional Supplements (ESTROVEN PO) Take by mouth.  As directed   Yes Historical Provider, MD  potassium chloride SA (K-DUR,KLOR-CON) 20 MEQ tablet Take 20 mEq by mouth daily.     Yes Historical Provider, MD  triamterene-hydrochlorothiazide (MAXZIDE-25) 37.5-25 MG per tablet TAKE ONE TABLET BY MOUTH EVERY DAY 03/14/11  Yes Rollene Rotunda, MD   Review of Systems  Right sided chest pain and anxiety as outlined in the HPI.  No sob, n, v, dizziness, syncope, edema, early satiety, dysuria, dark stools, blood in stools, diarrhea, rash/skin changes, fevers, chills, wt loss/gain.  Otherwise all systems reviewed and negative.  Physical Exam  Blood pressure 120/86, pulse 67, height 5\' 7"  (1.702 m), weight 146 lb (66.225 kg).  General: Pleasant, NAD Psych: Normal affect. Neuro: Alert and oriented X 3. Moves all extremities spontaneously. HEENT: Normal  Neck: Supple without bruits or JVD. Lungs:  Resp regular and unlabored, CTA. Heart: RRR no s3, s4, or murmurs. Abdomen: Soft, non-tender, non-distended, BS + x 4.  Extremities: No clubbing, cyanosis or edema. DP/PT/Radials 2+ and equal bilaterally.  Accessory Clinical Findings  ECG - RSR, no acute changes.  Assessment & Plan  1.  Right sided chest pain at rest:  Atypical, focal, right sided chest pain that is reproducible with palpation.  ECG is unchanged.  Had nonobs cath a year ago.  Will not pursue additional ischemic evaluation  at this time.  Rec prn nsaid.  2. Anxiety:  Pt with a lot of work-related stress.  She thinks that this may be driving her chest discomfort.  She plans to f/u with Dr. Yetta Barre.  3.  HTN:  Stable.  Cont current regimen.  4.  Dispo:  F/u Dr. Antoine Poche prn.   Nicolasa Ducking, NP 08/23/2011, 4:18 PM

## 2011-08-23 NOTE — Patient Instructions (Signed)
Your physician recommends that you schedule a follow-up appointment as needed with Dr Antoine Poche

## 2011-10-15 ENCOUNTER — Other Ambulatory Visit: Payer: Self-pay | Admitting: Cardiology

## 2011-10-15 NOTE — Telephone Encounter (Signed)
...   Requested Prescriptions   Pending Prescriptions Disp Refills  . triamterene-hydrochlorothiazide (MAXZIDE-25) 37.5-25 MG per tablet [Pharmacy Med Name: TRIAMT/HCTZ 37.5-25 TAB] 30 tablet 6    Sig: TAKE ONE TABLET BY MOUTH EVERY DAY

## 2011-11-15 ENCOUNTER — Encounter (HOSPITAL_COMMUNITY): Payer: Self-pay

## 2011-11-15 ENCOUNTER — Emergency Department (HOSPITAL_COMMUNITY): Payer: Self-pay

## 2011-11-15 ENCOUNTER — Inpatient Hospital Stay (HOSPITAL_COMMUNITY)
Admission: EM | Admit: 2011-11-15 | Discharge: 2011-11-17 | DRG: 310 | Disposition: A | Discharge status: Home / Self Care | Payer: Self-pay | Source: Ambulatory Visit | Attending: Cardiology | Admitting: Cardiology

## 2011-11-15 DIAGNOSIS — I10 Essential (primary) hypertension: Secondary | ICD-10-CM | POA: Diagnosis present

## 2011-11-15 DIAGNOSIS — I4891 Unspecified atrial fibrillation: Principal | ICD-10-CM

## 2011-11-15 DIAGNOSIS — I4892 Unspecified atrial flutter: Secondary | ICD-10-CM | POA: Diagnosis present

## 2011-11-15 DIAGNOSIS — R778 Other specified abnormalities of plasma proteins: Secondary | ICD-10-CM

## 2011-11-15 DIAGNOSIS — I2510 Atherosclerotic heart disease of native coronary artery without angina pectoris: Secondary | ICD-10-CM | POA: Diagnosis present

## 2011-11-15 DIAGNOSIS — E876 Hypokalemia: Secondary | ICD-10-CM | POA: Diagnosis present

## 2011-11-15 DIAGNOSIS — K219 Gastro-esophageal reflux disease without esophagitis: Secondary | ICD-10-CM | POA: Diagnosis present

## 2011-11-15 DIAGNOSIS — Z79899 Other long term (current) drug therapy: Secondary | ICD-10-CM

## 2011-11-15 DIAGNOSIS — I214 Non-ST elevation (NSTEMI) myocardial infarction: Secondary | ICD-10-CM | POA: Insufficient documentation

## 2011-11-15 DIAGNOSIS — F1010 Alcohol abuse, uncomplicated: Secondary | ICD-10-CM | POA: Diagnosis present

## 2011-11-15 DIAGNOSIS — Z7982 Long term (current) use of aspirin: Secondary | ICD-10-CM

## 2011-11-15 DIAGNOSIS — E041 Nontoxic single thyroid nodule: Secondary | ICD-10-CM | POA: Diagnosis present

## 2011-11-15 HISTORY — DX: Unspecified atrial fibrillation: I48.91

## 2011-11-15 LAB — BASIC METABOLIC PANEL
Calcium: 9.8 mg/dL (ref 8.4–10.5)
GFR calc Af Amer: 70 mL/min — ABNORMAL LOW (ref >90)
GFR calc non Af Amer: 60 mL/min — ABNORMAL LOW (ref >90)
Glucose, Bld: 102 mg/dL — ABNORMAL HIGH (ref 70–99)
Sodium: 129 mEq/L — ABNORMAL LOW (ref 135–145)

## 2011-11-15 LAB — CBC WITH DIFFERENTIAL/PLATELET
Basophils Relative: 0 % (ref 0–1)
Eosinophils Absolute: 0.1 10*3/uL (ref 0.0–0.7)
Lymphs Abs: 1.8 10*3/uL (ref 0.7–4.0)
MCH: 33.3 pg (ref 26.0–34.0)
Neutrophils Relative %: 70 % (ref 43–77)
Platelets: 239 10*3/uL (ref 150–400)
RBC: 5.04 MIL/uL (ref 3.87–5.11)

## 2011-11-15 LAB — D-DIMER, QUANTITATIVE: D-Dimer, Quant: <0.22 ug/mL-FEU (ref 0.00–0.48)

## 2011-11-15 LAB — TSH: TSH: 2.147 u[IU]/mL (ref 0.350–4.500)

## 2011-11-15 LAB — CARDIAC PANEL(CRET KIN+CKTOT+MB+TROPI): Total CK: 49 U/L (ref 7–177)

## 2011-11-15 LAB — POCT I-STAT TROPONIN I: Troponin i, poc: 0.23 ng/mL (ref 0.00–0.08)

## 2011-11-15 MED ORDER — ZOLPIDEM TARTRATE 5 MG PO TABS
5.0000 mg | ORAL_TABLET | Freq: Every evening | ORAL | Status: DC | PRN
  Administered 2011-11-15 – 2011-11-16 (×2): 5 mg via ORAL
  Filled 2011-11-15 (×2): qty 1

## 2011-11-15 MED ORDER — ASPIRIN 81 MG PO TABS: 81.0000 mg | ORAL_TABLET | Freq: Every day | ORAL | Status: DC

## 2011-11-15 MED ORDER — HEPARIN (PORCINE) IN NACL 100-0.45 UNIT/ML-% IJ SOLN
900.0000 [IU]/h | INTRAMUSCULAR | Status: DC
  Administered 2011-11-15 (×2): 900 [IU]/h via INTRAVENOUS
  Filled 2011-11-15 (×2): qty 250

## 2011-11-15 MED ORDER — DILTIAZEM HCL 25 MG/5ML IV SOLN
INTRAVENOUS | Status: AC
  Administered 2011-11-15: 20 mg via INTRAVENOUS
  Filled 2011-11-15: qty 5

## 2011-11-15 MED ORDER — SODIUM CHLORIDE 0.9 % IV SOLN
INTRAVENOUS | Status: DC
  Administered 2011-11-15: 125 mL/h via INTRAVENOUS

## 2011-11-15 MED ORDER — FERROUS SULFATE 325 (65 FE) MG PO TABS
325.0000 mg | ORAL_TABLET | Freq: Every day | ORAL | Status: DC
  Administered 2011-11-16 – 2011-11-17 (×2): 325 mg via ORAL
  Filled 2011-11-15 (×2): qty 1

## 2011-11-15 MED ORDER — ACETAMINOPHEN 325 MG PO TABS
650.0000 mg | ORAL_TABLET | ORAL | Status: DC | PRN
  Administered 2011-11-15 – 2011-11-16 (×3): 650 mg via ORAL
  Filled 2011-11-15 (×3): qty 2

## 2011-11-15 MED ORDER — HEPARIN BOLUS VIA INFUSION
3000.0000 [IU] | Freq: Once | INTRAVENOUS | Status: AC
  Administered 2011-11-15: 3000 [IU] via INTRAVENOUS

## 2011-11-15 MED ORDER — DILTIAZEM HCL 50 MG/10ML IV SOLN
20.0000 mg | Freq: Once | INTRAVENOUS | Status: DC
  Administered 2011-11-15: 20 mg via INTRAVENOUS

## 2011-11-15 MED ORDER — DILTIAZEM HCL 100 MG IV SOLR
5.0000 mg/h | INTRAVENOUS | Status: DC
  Administered 2011-11-15: 5 mg/h via INTRAVENOUS
  Filled 2011-11-15: qty 100

## 2011-11-15 MED ORDER — ASPIRIN 81 MG PO CHEW
81.0000 mg | CHEWABLE_TABLET | Freq: Every day | ORAL | Status: DC
  Administered 2011-11-16 – 2011-11-17 (×2): 81 mg via ORAL
  Filled 2011-11-15 (×2): qty 1

## 2011-11-15 MED ORDER — IOHEXOL 350 MG/ML SOLN
100.0000 mL | Freq: Once | INTRAVENOUS | Status: AC | PRN
  Administered 2011-11-15: 100 mL via INTRAVENOUS

## 2011-11-15 MED ORDER — CARVEDILOL 6.25 MG PO TABS
6.2500 mg | ORAL_TABLET | Freq: Two times a day (BID) | ORAL | Status: DC
  Administered 2011-11-16 – 2011-11-17 (×3): 6.25 mg via ORAL
  Filled 2011-11-15 (×5): qty 1

## 2011-11-15 MED ORDER — NITROGLYCERIN 0.4 MG SL SUBL: 0.4000 mg | SUBLINGUAL_TABLET | SUBLINGUAL | Status: DC | PRN

## 2011-11-15 MED ORDER — PANTOPRAZOLE SODIUM 20 MG PO TBEC
20.0000 mg | DELAYED_RELEASE_TABLET | Freq: Every day | ORAL | Status: DC
  Administered 2011-11-16 – 2011-11-17 (×2): 20 mg via ORAL
  Filled 2011-11-15 (×2): qty 1

## 2011-11-15 MED ORDER — LABETALOL HCL 5 MG/ML IV SOLN
20.0000 mg | Freq: Once | INTRAVENOUS | Status: AC
  Administered 2011-11-15: 20 mg via INTRAVENOUS

## 2011-11-15 MED ORDER — LANSOPRAZOLE 15 MG PO CPDR: 15.0000 mg | DELAYED_RELEASE_CAPSULE | Freq: Every day | ORAL | Status: DC

## 2011-11-15 MED ORDER — IBUPROFEN 200 MG PO TABS
200.0000 mg | ORAL_TABLET | Freq: Three times a day (TID) | ORAL | Status: DC | PRN
  Filled 2011-11-15: qty 1

## 2011-11-15 MED ORDER — ONDANSETRON HCL 4 MG/2ML IJ SOLN: 4.0000 mg | Freq: Four times a day (QID) | INTRAMUSCULAR | Status: DC | PRN

## 2011-11-15 MED ORDER — LABETALOL HCL 5 MG/ML IV SOLN
INTRAVENOUS | Status: AC
  Filled 2011-11-15: qty 4

## 2011-11-15 NOTE — H&P (Signed)
History and Physical  Patient ID: Christine Garcia MRN: [id number], SOB: 19-May-1960 50 y.o. Date of Encounter: 11/15/2011, 5:57 PM  Primary Physician: Sanda Linger, MD Primary Cardiologist: Dr. Rollene Rotunda  Chief Complaint: palpitations, sob, back/neck pain  HPI: 51 y.o. female w/ PMHx significant for HTN and nonobstructive CAD by cath 07/2010 who presented to [facility] on 11/15/2011 with complaints of palpitations, sob, and back/neck pain.  Progressive doe over the last couple of weeks. Wednesday morning went to work then felt upper back and neck pressure/pain, more sob, felt heart racing and nausea. Went home and stayed in bed until this morning. Felt fine this morning, went to work and felt the same pain/pressure, felt weak in her legs, and more sob prompting her to present to the ED. Denies chest pain, orthopnea, edema, fevers, chills, recent illnesses, prolonged immobilization/travel. Has noted blood in her stool twice in the last two months, last time was one month ago, no abd pain or melena. Usually drinks occasionally, but has had increased ETOH intake over the last week ~7 beers/ day for three days last week. Very little caffeine intake. Increased social stress in her life recently. No tobacco or illicit drug use. Denies snoring.   EKG revealed Atrial Fibrillation 115bpm, TWI V3-6. CTA chest was without evidence of acute cardiopulmonary findings, small low density nodule of right lobe of thyroid gland. Labs are significant for poc troponin 0.23, normal DDimer, K+ 3.9, Crt 1.06, WBC 9.6, H&H 16.8/45.8. She feels significantly better, but has a headache.   Past Medical History  Diagnosis Date  . GERD (gastroesophageal reflux disease)   . Murmur   . Hypertension   . Chest pain at rest     a. 07/2010 Cath: nonobs dzs, EF 65%    07/14/10 - Cardiac Cath Hemodynamics: LV 157/15, AO 158/114. Coronaries, left main was normal. The LAD had mid long 25% stenosis. It wrapped the apex.  However, as with all of her other vessels, these were somewhat narrow caliber vessels. The circumflex in the AV groove was normal. A ramus  intermediate was large with ostial 25% stenosis. The right coronary  artery was a dominant vessel. There was proximal 30% stenosis which  might have been related to spasm. The PDA was small-to-moderate size  and normal throughout. Left ventriculogram, the left ventriculogram was  obtained in the RAO projection. The EF is 65% with normal wall motion.   CONCLUSION: Nonobstructive coronary artery disease. There was some  suggestion of coronary spasm. She does have narrow caliber vessels.  However, there is clearly nothing needs to be fixed mechanically. She  will be  managed medically for probable nonanginal chest pain as there is no  objective evidence of ischemia. If there is no other etiology   identified, we could consider further management for possible spasm with  nitrates and calcium channel blockers going forward  Surgical History:  Past Surgical History  Procedure Date  . None   . Cardiac catheterization 07/14/2010     Home Meds: Medication Sig  aspirin 81 MG tablet Take 81 mg by mouth daily.    carvedilol (COREG) 6.25 MG tablet Take 1 tablet (6.25 mg total) by mouth 2 (two) times daily with a meal.  ferrous sulfate 325 (65 FE) MG tablet Take 325 mg by mouth daily.    ibuprofen (ADVIL,MOTRIN) 200 MG tablet Take 200 mg by mouth every 8 (eight) hours as needed. for headaches  Lansoprazole (PREVACID 24HR PO) Take 1 capsule by mouth daily.  Nutritional Supplements (ESTROVEN PO) Take 1 tablet by mouth daily.   potassium chloride SA (K-DUR,KLOR-CON) 20 MEQ tablet Take 20 mEq by mouth 2 (two) times daily.   triamterene-hydrochlorothiazide (MAXZIDE-25) 37.5-25 MG per tablet TAKE ONE TABLET BY MOUTH EVERY DAY    Allergies:  Allergies  Allergen Reactions  . Penicillins     Unknown    History   Social History  . Marital Status: Married    Spouse  Name: N/A    Number of Children: N/A  . Years of Education: N/A   Occupational History  . Server    Social History Main Topics  . Smoking status: Never Smoker   . Smokeless tobacco: Never Used  . Alcohol Use: Yes     not on a regular basis  . Drug Use: No  . Sexually Active:    Other Topics Concern  . Not on file   Social History Narrative  . No narrative on file     Family History  Problem Relation Age of Onset  . Hypertension      family Hx of  . Stroke      family Hx of 1st degree relative  . Heart failure Father     CHF  . Heart attack Mother     CVA's / MI  . Heart attack Sister 67    Review of Systems: General: negative for chills, fever, night sweats or weight changes.  Cardiovascular: As per HPI Dermatological: negative for rash Respiratory: negative for cough or wheezing Urologic: negative for hematuria Abdominal: (+) nausea, bright red blood per rectum; negative for  vomiting, diarrhea, melena, or hematemesis Neurologic: negative for visual changes, syncope, or dizziness All other systems reviewed and are otherwise negative except as noted above.  Labs:   Lab Results  Component Value Date   WBC 9.6 11/15/2011   HGB 16.8* 11/15/2011   HCT 45.8 11/15/2011   MCV 90.9 11/15/2011   PLT 239 11/15/2011    Lab 11/15/11 1434  NA 129*  K 3.9  CL 89*  CO2 21  BUN 10  CREATININE 1.06  CALCIUM 9.8  GLUCOSE 102*     11/15/2011 16:08  Troponin i, poc 0.23 (HH)   Component Value Date   DDIMER <0.22 11/15/2011    Radiology/Studies:   11/15/2011 - Ct Angio Chest W/cm &/or Wo Cm Findings: Unenhanced images of the chest shows no mediastinal hematoma.  No aortic calcifications are noted.  Post contrast images demonstrate no evidence of aortic dissection. No pulmonary embolus is noted.  Ascending aorta measures 3.1 cm in diameter.  Descending aorta measures 2.4 cm in diameter.  Heart size is within normal limits.  No pericardial effusion.  There is no adenopathy.  No  hilar adenopathy.  No axillary adenopathy.  The visualized upper abdomen shows the visualized abdominal aorta to be unremarkable.  There is no periaortic leak.  Images of the lung parenchyma shows no acute infiltrate or pleural effusion.  No pulmonary edema.  No pericardial effusion.  Central airways are patent.  Images of the thoracic inlet shows a low density nodule within anterior aspect of the right thyroid gland measures 7 mm.  Low density nodule within the right lobe thyroid gland measures 6.5 mm.  IMPRESSION:  1.  There is no evidence of aortic dissection or periaortic leak. 2.  Mild ectatic ascending aorta measures 3.1 cm in diameter. Descending aorta measures 2.4 cm in diameter.  No mediastinal hematoma or adenopathy. 3.  No pulmonary embolus. 4.  No acute infiltrate or pulmonary edema. 5.  Small low density nodules right lobe of thyroid gland.  Further evaluation with thyroid gland ultrasound is recommended.      EKG: 11/15/11 @ 1530 - Atrial Fibrillation 115bpm, TWI V3-6  Physical Exam: Blood pressure 98/73, pulse 78, temperature 97.5 F (36.4 C), temperature source Oral, resp. rate 13, last menstrual period 09/15/2011, SpO2 100.00%. General: Well developed, well nourished, middle aged white female, in no acute distress. Head: Normocephalic, atraumatic, sclera non-icteric, nares are without discharge Neck: Supple. Negative for carotid bruits. JVD not elevated. Lungs: Clear bilaterally to auscultation without wheezes, rales, or rhonchi. Breathing is unlabored. Heart: Irregular with S1 S2. No murmurs, rubs, or gallops appreciated. Abdomen: Soft, non-tender, non-distended with normoactive bowel sounds. No rebound/guarding. No obvious abdominal masses. Msk:  Strength and tone appear normal for age. Extremities: No edema. No clubbing or cyanosis. Distal pedal pulses are 2+ and equal bilaterally. Neuro: Alert and oriented X 3. Moves all extremities spontaneously. Psych:  Responds to questions  appropriately with a normal affect.    ASSESSMENT AND PLAN:  51 y.o. female w/ PMHx significant for HTN and nonobstructive CAD by cath 07/2010 who presented to [facility] on 11/15/2011 with complaints of palpitations, sob, LE weakness, and back/neck pain.  1. Atrial Fibrillation w/ RVR: Patient has no prior history of atrial arrhythmias. She has been under a significant amount of stress and has had increased ETOH intake. Has felt more sob over the last couple of weeks, but started feeling palpitations and neck/back pressure yesterday morning. Presented to the ED in A.Fib w/ RVR rates 120s-130s. She is currently rate controlled on IV cardizem, but remains in a.fib. CHADS2 score 1 (HTN). Will admit, monitor on telemetry, start IV heparin, check TSH/Mg, and obtain 2D Echo. Will keep NPO after midnight for possible DCCV if she does not spontaneously convert to NSR.  2. NSTEMI: H/o nonobstructive cath 07/2010. poc troponin elevated at 0.23. Likely Type II in the setting of A.Fib w/ RVR. CTA neg for acute cardiopulmonary findings. Denies chest pain. Will cycle enzymes to assess trend. Check echo for WMAs. Repeat EKG in the am. Cont ASA, BB. Heparin as above.  3. HTN: Stable. Monitor for hypotension while on cardizem drip.  4. Thyroid nodule: Incidentally found on CTA chest. Check TSH.  Signed, Justus Duerr PA-C 11/15/2011, 5:57 PM   Attending note:  Patient seen and examined. Reviewed records and database as recorded by Ms. Hisae Decoursey. She has a history of hypertension, nonobstructive CAD with question of spasm at catheterization back in March of 2012, atypical chest pain over time. She was most recently seen in the office by Mr. Brion Aliment for Dr. Antoine Poche in April. No prior history of cardiac arrhythmia. She presents to the ER complaining of 24-48 hours of intermittent palpitations, upper back and neck discomfort. Also cites psychosocial stressors, increased alcohol intake over her baseline within the week  socially. She otherwise reports compliance with her medications.  She was noted to be in atrial fibrillation with rapid ventricular response in ER, treated with intravenous Cardizem with slowing of rate. Also looks to be intermittent atrial flutter. She reports improvement in symptoms, some mild residual back discomfort. ECG does show some T wave inversions anteriorly, initial troponin level 0.23, normal d-dimer. Chest CTA was obtained by ER staff showing some mild ectasia of the ascending aorta, no evidence of dissection, no pulmonary embolus, no infiltrates. Incidentally noted small low density nodules within right lobe of thyroid.  On examination  she is comfortable, , heart rate is in the 60s to 70s, systolic blood pressure 95-112, afebrile. Lungs are clear, cardiac exam with irregular rhythm, no significant diastolic murmur, very soft systolic murmur, no pitting edema.  Additional lab work reviewed finding potassium 3.9, creatinine 1.0, hemoglobin 16.8, platelets 239.  Plan at this point is admission for further evaluation on telemetry. Will continue aspirin, initiate intravenous heparin, and continue Cardizem infusion. CHADS2 score is 1 based on hypertension. Not clear that she will require longer-term anticoagulation, even if she ultimately needs DC cardioversion. Cycle full set of ardiac markers to assess troponin trend. Echocardiogram will be obtained. Depending on rhythm control, troponin trend, and followup echocardiogram results, determination can be made as to whether a cardioversion will be pursued during hospitalization for rapid return of sinus rhythm, versus whether additional ischemic assessment is required first. Check TSH. She will likely need further thyroid imaging. Further management by Dr. Antoine Poche. NPO after midnnight.  Jonelle Sidle, M.D., F.A.C.C.

## 2011-11-15 NOTE — ED Notes (Signed)
Pt returned to exam room from CT scan. Pt remains on cardiac monitor. Remains tachycardic. She remains alert and oriented x4. Family at bedside. Pt updated on plan of care.

## 2011-11-15 NOTE — ED Notes (Signed)
RN to CT scan with patient. She remains tachycardic. Denies chest pain. Respirations unlabored.

## 2011-11-15 NOTE — ED Provider Notes (Addendum)
History     CSN: [id number]  Arrival date & time 11/15/11  1347   First MD Initiated Contact with Patient 11/15/11 1405      Chief Complaint  Patient presents with  . Torticollis  . Back Pain    (Consider location/radiation/quality/duration/timing/severity/associated sxs/prior treatment) The history is provided by the patient. The history is limited by the condition of the patient.  the pt has hx of htn.   She does NOT smoke.   yest at work she developed sudden onset of upper back pain, lower neck pain with nausea, sob, dizziness and weakness in her legs.  She denies cp, vomiting, cough.  Sxs resolved.  Today, while at work, she developed similar sxs.  Now her upper back pain is mild like pressure.  She does not take ocps.    Past Medical History  Diagnosis Date  . GERD (gastroesophageal reflux disease)   . Murmur   . Hypertension   . Chest pain at rest     a. 07/2010 Cath: nonobs dzs, EF 65%    Past Surgical History  Procedure Date  . None   . Cardiac catheterization 07/14/2010    Family History  Problem Relation Age of Onset  . Hypertension      family Hx of  . Stroke      family Hx of 1st degree relative  . Heart failure Father     CHF  . Heart attack Mother     CVA's / MI  . Heart attack Sister 76    History  Substance Use Topics  . Smoking status: Never Smoker   . Smokeless tobacco: Never Used  . Alcohol Use: Yes     not on a regular basis    OB History    Grav Para Term Preterm Abortions TAB SAB Ect Mult Living                  Review of Systems  Constitutional: Negative for fever and chills.  HENT: Positive for neck pain.   Eyes: Negative for visual disturbance.  Respiratory: Positive for shortness of breath. Negative for cough and chest tightness.   Cardiovascular: Negative for chest pain, palpitations and leg swelling.  Gastrointestinal: Positive for nausea. Negative for vomiting and diarrhea.  Musculoskeletal: Positive for back pain.    Neurological: Positive for weakness. Negative for headaches.  Psychiatric/Behavioral: Negative for confusion.  All other systems reviewed and are negative.    Allergies  Penicillins  Home Medications   Current Outpatient Rx  Name Route Sig Dispense Refill  . ASPIRIN 81 MG PO TABS Oral Take 81 mg by mouth daily.      Marland Kitchen CARVEDILOL 6.25 MG PO TABS Oral Take 1 tablet (6.25 mg total) by mouth 2 (two) times daily with a meal. 60 tablet 5  . FERROUS SULFATE 325 (65 FE) MG PO TABS Oral Take 325 mg by mouth daily.      . IBUPROFEN 200 MG PO TABS Oral Take 200 mg by mouth. 3 tablets daily for headaches     . PREVACID 24HR PO Oral Take 1 capsule by mouth daily.      Marland Kitchen ESTROVEN PO Oral Take by mouth. As directed    . POTASSIUM CHLORIDE CRYS ER 20 MEQ PO TBCR Oral Take 20 mEq by mouth daily.      . TRIAMTERENE-HCTZ 37.5-25 MG PO TABS  TAKE ONE TABLET BY MOUTH EVERY DAY 30 tablet 6    BP 133/106  Pulse 68  Temp 97.5 F (36.4 C) (Oral)  Resp 18  SpO2 100%  Physical Exam  Nursing note and vitals reviewed. Constitutional: She is oriented to person, place, and time. She appears well-developed and well-nourished. No distress.  HENT:  Head: Normocephalic and atraumatic.  Eyes: Conjunctivae and EOM are normal.  Neck: Normal range of motion. Neck supple.  Cardiovascular: Regular rhythm.   No murmur heard.      Tachycardia Pulses diminished in all 4 extremities  Pulmonary/Chest: Effort normal and breath sounds normal. No respiratory distress.  Abdominal: Soft. She exhibits no distension. There is no tenderness.  Musculoskeletal: Normal range of motion. She exhibits no edema and no tenderness.       Tenderness across her upper back at level of shoulders  Neurological: She is alert and oriented to person, place, and time. No cranial nerve deficit.  Skin: Skin is warm and dry.  Psychiatric: She has a normal mood and affect. Thought content normal.    ED Course  Procedures (including  critical care time) Sudden onset of back pain with sob, dizziness and leg weakness in htn female.  Tachycardic. No neuro deficits or sxs now.   Concerned for aortic dissection.   Doubt neurological etio for sxs.  No hx of vomiting/diarrhea.  No sxs to suggest infection.     Labs Reviewed  BASIC METABOLIC PANEL  CBC WITH DIFFERENTIAL   No results found.   No diagnosis found.  ECG  atrial fibrillation hr 115 bmp  Nl intervals Right axis deviation Nonspecific tw changes  MDM  Back pain with tachycardia        Cheri Guppy, MD 11/15/11 1537  Cheri Guppy, MD 11/15/11 1539  Cheri Guppy, MD 11/15/11 1544

## 2011-11-15 NOTE — ED Notes (Signed)
Pt st's she feels better now that heart rate is slower.  Pt using bedpan at this time.  Pt continues to deny chest pain.

## 2011-11-15 NOTE — ED Provider Notes (Signed)
History     CSN: [id number]  Arrival date & time 11/15/11  1347   First MD Initiated Contact with Patient 11/15/11 1405      Chief Complaint  Patient presents with  . Torticollis  . Back Pain    (Consider location/radiation/quality/duration/timing/severity/associated sxs/prior treatment) HPI  Past Medical History  Diagnosis Date  . GERD (gastroesophageal reflux disease)   . Murmur   . Hypertension   . Chest pain at rest     a. 07/2010 Cath: nonobs dzs, EF 65%    Past Surgical History  Procedure Date  . None   . Cardiac catheterization 07/14/2010    Family History  Problem Relation Age of Onset  . Hypertension      family Hx of  . Stroke      family Hx of 1st degree relative  . Heart failure Father     CHF  . Heart attack Mother     CVA's / MI  . Heart attack Sister 63    History  Substance Use Topics  . Smoking status: Never Smoker   . Smokeless tobacco: Never Used  . Alcohol Use: Yes     not on a regular basis    OB History    Grav Para Term Preterm Abortions TAB SAB Ect Mult Living                  Review of Systems  Allergies  Penicillins  Home Medications   Current Outpatient Rx  Name Route Sig Dispense Refill  . ASPIRIN 81 MG PO TABS Oral Take 81 mg by mouth daily.      Marland Kitchen CARVEDILOL 6.25 MG PO TABS Oral Take 1 tablet (6.25 mg total) by mouth 2 (two) times daily with a meal. 60 tablet 5  . FERROUS SULFATE 325 (65 FE) MG PO TABS Oral Take 325 mg by mouth daily.      . IBUPROFEN 200 MG PO TABS Oral Take 200 mg by mouth. 3 tablets daily for headaches     . PREVACID 24HR PO Oral Take 1 capsule by mouth daily.      Marland Kitchen ESTROVEN PO Oral Take by mouth. As directed    . POTASSIUM CHLORIDE CRYS ER 20 MEQ PO TBCR Oral Take 20 mEq by mouth daily.      . TRIAMTERENE-HCTZ 37.5-25 MG PO TABS  TAKE ONE TABLET BY MOUTH EVERY DAY 30 tablet 6    BP 112/74  Pulse 72  Temp 97.5 F (36.4 C) (Oral)  Resp 17  SpO2 100%  LMP 09/15/2011  Physical  Exam  ED Course  Procedures (including critical care time)  Labs Reviewed  BASIC METABOLIC PANEL - Abnormal; Notable for the following:    Sodium 129 (*)     Chloride 89 (*)     Glucose, Bld 102 (*)     GFR calc non Af Amer 60 (*)     GFR calc Af Amer 70 (*)     All other components within normal limits  CBC WITH DIFFERENTIAL - Abnormal; Notable for the following:    Hemoglobin 16.8 (*)     MCHC 36.7 (*)     All other components within normal limits  D-DIMER, QUANTITATIVE   Ct Angio Chest W/cm &/or Wo Cm  11/15/2011  *RADIOLOGY REPORT*  Clinical Data: Back pain, neck pain, rule out aortic dissection  CT ANGIOGRAPHY CHEST  Technique:  Multidetector CT imaging of the chest using the standard protocol during bolus administration of  intravenous contrast. Multiplanar reconstructed images including MIPs were obtained and reviewed to evaluate the vascular anatomy.  Contrast: OMNIPAQUE IOHEXOL 350 MG/ML SOLN  Comparison: None.  Findings: Unenhanced images of the chest shows no mediastinal hematoma.  No aortic calcifications are noted.  Post contrast images demonstrate no evidence of aortic dissection. No pulmonary embolus is noted.  Ascending aorta measures 3.1 cm in diameter.  Descending aorta measures 2.4 cm in diameter.  Heart size is within normal limits.  No pericardial effusion.  There is no adenopathy.  No hilar adenopathy.  No axillary adenopathy.  The visualized upper abdomen shows the visualized abdominal aorta to be unremarkable.  There is no periaortic leak.  Images of the lung parenchyma shows no acute infiltrate or pleural effusion.  No pulmonary edema.  No pericardial effusion.  Central airways are patent.  Images of the thoracic inlet shows a low density nodule within anterior aspect of the right thyroid gland measures 7 mm.  Low density nodule within the right lobe thyroid gland measures 6.5 mm.  IMPRESSION:  1.  There is no evidence of aortic dissection or periaortic leak. 2.  Mild  ectatic ascending aorta measures 3.1 cm in diameter. Descending aorta measures 2.4 cm in diameter.  No mediastinal hematoma or adenopathy. 3.  No pulmonary embolus. 4.  No acute infiltrate or pulmonary edema. 5.  Small low density nodules right lobe of thyroid gland.  Further evaluation with thyroid gland ultrasound is recommended.  Original Report Authenticated By: Natasha Mead, M.D.     New Onset Afib with RVR   MDM  Care of patient taken over from Dr. Weldon Inches.  Christine Garcia is 51 year old who presents with shortness of breath, nausea and back and neck pain.  Initial heartrate noted to be 136 and was thought to be regular, she was evaluated and discovered to be in new onset afib with RVR, she was given 20mg  of cardizem and this has controlled her rate to 70's, but she remains in afib.  Her troponin also resulted with elevation to 0.23, there are no signs of ischemia on EKG so this is likely related to the afib.  I have spoken with Dr. Diona Browner with [facility] cardiology who will admit the patient for this.        Christine Garcia, [place] 11/15/11 [phone]

## 2011-11-15 NOTE — ED Notes (Addendum)
Pt presents to department for evaluation of neck and upper back pain. Also states shortness of breath and nausea. 7/10 pain at the time. Describes as "pressure" sensation. Also states weakness to both legs, states "my legs feel loose." Pt denies recent injury to neck/back. Respirations unlabored. Lung sounds clear and equal bilaterally. States she feels very dizzy. She is conscious alert and oriented x4. Able to move all extremities without difficulty. No neuro deficits noted.

## 2011-11-15 NOTE — ED Notes (Signed)
Onset yesterday with neck and back pain, nauseated, today felt sick and then started having the pain again, sts feels like her equillibrium is off

## 2011-11-15 NOTE — ED Notes (Signed)
Abnormal labs given to Dr. Estell Harpin

## 2011-11-15 NOTE — Progress Notes (Signed)
ANTICOAGULATION CONSULT NOTE - Initial Consult  Pharmacy Consult for Heparin Indication: chest pain/ACS  Allergies  Allergen Reactions  . Penicillins     Unknown    Patient Measurements: Height: 5\' 7"  (170.2 cm) Weight: 130 lb (58.968 kg) IBW/kg (Calculated) : 61.6  Heparin Dosing Weight: 58kg Vital Signs: Temp: 97.5 F (36.4 C) (07/04 1352) Temp src: Oral (07/04 1352) BP: 98/73 mmHg (07/04 1752) Pulse Rate: 78  (07/04 1752)  Labs:  Basename 11/15/11 1434  HGB 16.8*  HCT 45.8  PLT 239  APTT --  LABPROT --  INR --  HEPARINUNFRC --  CREATININE 1.06  CKTOTAL --  CKMB --  TROPONINI --    The CrCl is unknown because both a height and weight (above a minimum accepted value) are required for this calculation.   Medical History: Past Medical History  Diagnosis Date  . GERD (gastroesophageal reflux disease)   . Murmur   . Hypertension   . Chest pain at rest     a. 07/2010 Cath: nonobs dzs, EF 65%    Medications:   (Not in a hospital admission) But includes ASA, coreg, iron, IBU, previcid, estroven, KCl, Maxzide  Admit Complaint: 51 y.o.  female  admitted 11/15/2011 with palpitation, SOB, back/neck pain.Marland Kitchen  Pharmacy consulted to dose heparin for atrial fibrillation.  Assessment: Anticoagulation: Afib.  To start heparin, patient denies recent surgery or bleeding disorder, however notes periodic blood when she stools, bright red blood in toilet bowl for several stools, last was 1 month ago.  Cardiovascular: HTN, CAD Gastrointestinal / Nutrition: GERD   PTA Medication Issues: Home Meds Not Ordered: Follow up admission orders  Best Practices: DVT Prophylaxis:   IV heparin  Goal of Therapy:  Heparin level 0.3-0.7 units/ml Monitor platelets by anticoagulation protocol: Yes   Plan:  Give 3000 units bolus x 1 Start heparin infusion at 900 units/hr Check anti-Xa level in 6 hours and daily while on heparin Continue to monitor H&H and platelets   Thank you for  allowing pharmacy to be a part of this patients care team.  Lovenia Kim Pharm.D., BCPS Clinical Pharmacist 11/15/2011 6:40 PM Pager: [phone] Phone: [phone]

## 2011-11-16 DIAGNOSIS — I4891 Unspecified atrial fibrillation: Principal | ICD-10-CM

## 2011-11-16 DIAGNOSIS — I369 Nonrheumatic tricuspid valve disorder, unspecified: Secondary | ICD-10-CM

## 2011-11-16 LAB — BASIC METABOLIC PANEL
Calcium: 9.1 mg/dL (ref 8.4–10.5)
GFR calc non Af Amer: >90 mL/min (ref >90)
Sodium: 135 mEq/L (ref 135–145)

## 2011-11-16 LAB — CARDIAC PANEL(CRET KIN+CKTOT+MB+TROPI)
CK, MB: 3.6 ng/mL (ref 0.3–4.0)
CK, MB: 3.1 ng/mL (ref 0.3–4.0)
Relative Index: INVALID (ref 0.0–2.5)
Relative Index: INVALID (ref 0.0–2.5)
Total CK: 41 U/L (ref 7–177)
Total CK: 37 U/L (ref 7–177)
Troponin I: <0.3 ng/mL (ref <0.30)

## 2011-11-16 LAB — HEPARIN LEVEL (UNFRACTIONATED)
Heparin Unfractionated: 0.32 IU/mL (ref 0.30–0.70)
Heparin Unfractionated: 0.24 IU/mL — ABNORMAL LOW (ref 0.30–0.70)

## 2011-11-16 LAB — POTASSIUM: Potassium: 2.6 mEq/L — CL (ref 3.5–5.1)

## 2011-11-16 LAB — CBC
Hemoglobin: 15.5 g/dL — ABNORMAL HIGH (ref 12.0–15.0)
RBC: 4.65 MIL/uL (ref 3.87–5.11)

## 2011-11-16 MED ORDER — POTASSIUM CHLORIDE CRYS ER 20 MEQ PO TBCR
20.0000 meq | EXTENDED_RELEASE_TABLET | Freq: Two times a day (BID) | ORAL | Status: DC
  Administered 2011-11-16 (×2): 20 meq via ORAL
  Filled 2011-11-16: qty 1
  Filled 2011-11-16: qty 2
  Filled 2011-11-16 (×2): qty 1
  Filled 2011-11-16: qty 2

## 2011-11-16 MED ORDER — POTASSIUM CHLORIDE CRYS ER 20 MEQ PO TBCR
40.0000 meq | EXTENDED_RELEASE_TABLET | Freq: Once | ORAL | Status: AC
  Administered 2011-11-16: 40 meq via ORAL

## 2011-11-16 MED ORDER — POTASSIUM CHLORIDE CRYS ER 20 MEQ PO TBCR
40.0000 meq | EXTENDED_RELEASE_TABLET | Freq: Two times a day (BID) | ORAL | Status: AC
  Administered 2011-11-16 (×2): 40 meq via ORAL
  Filled 2011-11-16 (×2): qty 2

## 2011-11-16 MED ORDER — POTASSIUM CHLORIDE 10 MEQ/100ML IV SOLN
10.0000 meq | INTRAVENOUS | Status: AC
  Administered 2011-11-16 (×4): 10 meq via INTRAVENOUS
  Filled 2011-11-16 (×4): qty 100

## 2011-11-16 MED ORDER — POTASSIUM CHLORIDE CRYS ER 20 MEQ PO TBCR
20.0000 meq | EXTENDED_RELEASE_TABLET | Freq: Once | ORAL | Status: AC
  Administered 2011-11-16: 20 meq via ORAL

## 2011-11-16 NOTE — Progress Notes (Signed)
UR Completed.  Christine Garcia [phone] 11/16/2011

## 2011-11-16 NOTE — Progress Notes (Signed)
ANTICOAGULATION CONSULT NOTE - Follow Up Consult  Pharmacy Consult for heparin Indication: atrial fibrillation  Labs:  Basename 11/16/11 0033 11/15/11 1834 11/15/11 1434  HGB -- -- 16.8*  HCT -- -- 45.8  PLT -- -- 239  APTT -- -- --  LABPROT -- -- --  INR -- -- --  HEPARINUNFRC 0.32 -- --  CREATININE -- -- 1.06  CKTOTAL 41 49 --  CKMB 3.6 4.0 --  TROPONINI 0.32* 0.31* --   Assessment/Plan: 51yo female therapeutic on heparin with initial dosing for Afib, at low end of goal but has had some rectal bleeding in past so will not increase gtt rate for now and confirm with next CE.  Colleen Can PharmD BCPS 11/16/2011,1:24 AM

## 2011-11-16 NOTE — Progress Notes (Signed)
Lab notified nurse of critical lab: K 2.4. Dr. Mayford Knife with [facility] was notified. Nurse currently waiting for call back. Harmon Pier

## 2011-11-16 NOTE — Progress Notes (Signed)
  Echocardiogram 2D Echocardiogram has been performed.  Christine Garcia 11/16/2011, 3:30 PM

## 2011-11-16 NOTE — ED Provider Notes (Signed)
Medical screening examination/treatment/procedure(s) were conducted as a shared visit with non-physician practitioner(s) and myself.  I personally evaluated the patient during the encounter  Cheri Guppy, MD 11/16/11 [phone]

## 2011-11-16 NOTE — Progress Notes (Signed)
SUBJECTIVE:  No pain.  No SOB.  Palpitations resolved   PHYSICAL EXAM Filed Vitals:   11/15/11 1752 11/15/11 1928 11/15/11 2100 11/16/11 0404  BP: 98/73 89/74 111/81 100/68  Pulse: 78 85 76 66  Temp:   97 F (36.1 C) 97.4 F (36.3 C)  TempSrc:   Oral Oral  Resp: 13 11 16 18   Height: 5\' 7"  (1.702 m)  5\' 7"  (1.702 m)   Weight: 130 lb (58.968 kg)  140 lb 3.4 oz (63.6 kg)   SpO2: 100% 100% 100% 100%   General:  No distress Lungs:  Clear Heart:  RRR, no murmur. Abdomen:  Positive bowel sounds, no rebound no guarding Extremities:  No edema.    LABS: Lab Results  Component Value Date   CKTOTAL 37 11/16/2011   CKMB 3.1 11/16/2011   TROPONINI <0.30 11/16/2011   Results for orders placed during the hospital encounter of 11/15/11 (from the past 24 hour(s))  BASIC METABOLIC PANEL     Status: Abnormal   Collection Time   11/15/11  2:34 PM      Component Value Range   Sodium 129 (*) 135 - 145 mEq/L   Potassium 3.9  3.5 - 5.1 mEq/L   Chloride 89 (*) 96 - 112 mEq/L   CO2 21  19 - 32 mEq/L   Glucose, Bld 102 (*) 70 - 99 mg/dL   BUN 10  6 - 23 mg/dL   Creatinine, Ser 9.60  0.50 - 1.10 mg/dL   Calcium 9.8  8.4 - 45.4 mg/dL   GFR calc non Af Amer 60 (*) >90 mL/min   GFR calc Af Amer 70 (*) >90 mL/min  CBC WITH DIFFERENTIAL     Status: Abnormal   Collection Time   11/15/11  2:34 PM      Component Value Range   WBC 9.6  4.0 - 10.5 K/uL   RBC 5.04  3.87 - 5.11 MIL/uL   Hemoglobin 16.8 (*) 12.0 - 15.0 g/dL   HCT 09.8  11.9 - 14.7 %   MCV 90.9  78.0 - 100.0 fL   MCH 33.3  26.0 - 34.0 pg   MCHC 36.7 (*) 30.0 - 36.0 g/dL   RDW 82.9  56.2 - 13.0 %   Platelets 239  150 - 400 K/uL   Neutrophils Relative 70  43 - 77 %   Neutro Abs 6.8  1.7 - 7.7 K/uL   Lymphocytes Relative 19  12 - 46 %   Lymphs Abs 1.8  0.7 - 4.0 K/uL   Monocytes Relative 10  3 - 12 %   Monocytes Absolute 1.0  0.1 - 1.0 K/uL   Eosinophils Relative 1  0 - 5 %   Eosinophils Absolute 0.1  0.0 - 0.7 K/uL   Basophils  Relative 0  0 - 1 %   Basophils Absolute 0.0  0.0 - 0.1 K/uL  D-DIMER, QUANTITATIVE     Status: Normal   Collection Time   11/15/11  2:48 PM      Component Value Range   D-Dimer, Quant <0.22  0.00 - 0.48 ug/mL-FEU  POCT I-STAT TROPONIN I     Status: Abnormal   Collection Time   11/15/11  4:08 PM      Component Value Range   Troponin i, poc 0.23 (*) 0.00 - 0.08 ng/mL   Comment NOTIFIED PHYSICIAN     Comment 3           CARDIAC PANEL(CRET KIN+CKTOT+MB+TROPI)  Status: Abnormal   Collection Time   11/15/11  6:34 PM      Component Value Range   Total CK 49  7 - 177 U/L   CK, MB 4.0  0.3 - 4.0 ng/mL   Troponin I 0.31 (*) <0.30 ng/mL   Relative Index RELATIVE INDEX IS INVALID  0.0 - 2.5  TSH     Status: Normal   Collection Time   11/15/11  6:34 PM      Component Value Range   TSH 2.147  0.350 - 4.500 uIU/mL  MAGNESIUM     Status: Normal   Collection Time   11/15/11  6:34 PM      Component Value Range   Magnesium 2.1  1.5 - 2.5 mg/dL  CARDIAC PANEL(CRET KIN+CKTOT+MB+TROPI)     Status: Abnormal   Collection Time   11/16/11 12:33 AM      Component Value Range   Total CK 41  7 - 177 U/L   CK, MB 3.6  0.3 - 4.0 ng/mL   Troponin I 0.32 (*) <0.30 ng/mL   Relative Index RELATIVE INDEX IS INVALID  0.0 - 2.5  HEPARIN LEVEL (UNFRACTIONATED)     Status: Normal   Collection Time   11/16/11 12:33 AM      Component Value Range   Heparin Unfractionated 0.32  0.30 - 0.70 IU/mL  CBC     Status: Abnormal   Collection Time   11/16/11  4:50 AM      Component Value Range   WBC 5.4  4.0 - 10.5 K/uL   RBC 4.65  3.87 - 5.11 MIL/uL   Hemoglobin 15.5 (*) 12.0 - 15.0 g/dL   HCT 16.1  09.6 - 04.5 %   MCV 91.2  78.0 - 100.0 fL   MCH 33.3  26.0 - 34.0 pg   MCHC 36.6 (*) 30.0 - 36.0 g/dL   RDW 40.9  81.1 - 91.4 %   Platelets 196  150 - 400 K/uL  BASIC METABOLIC PANEL     Status: Abnormal   Collection Time   11/16/11  4:50 AM      Component Value Range   Sodium 135  135 - 145 mEq/L   Potassium 2.4 (*) 3.5 -  5.1 mEq/L   Chloride 95 (*) 96 - 112 mEq/L   CO2 22  19 - 32 mEq/L   Glucose, Bld 97  70 - 99 mg/dL   BUN 8  6 - 23 mg/dL   Creatinine, Ser 7.82  0.50 - 1.10 mg/dL   Calcium 9.1  8.4 - 95.6 mg/dL   GFR calc non Af Amer >90  >90 mL/min   GFR calc Af Amer >90  >90 mL/min  HEPARIN LEVEL (UNFRACTIONATED)     Status: Abnormal   Collection Time   11/16/11  7:40 AM      Component Value Range   Heparin Unfractionated 0.24 (*) 0.30 - 0.70 IU/mL  POTASSIUM     Status: Abnormal   Collection Time   11/16/11  7:40 AM      Component Value Range   Potassium 2.6 (*) 3.5 - 5.1 mEq/L  CARDIAC PANEL(CRET KIN+CKTOT+MB+TROPI)     Status: Normal   Collection Time   11/16/11  8:00 AM      Component Value Range   Total CK 37  7 - 177 U/L   CK, MB 3.1  0.3 - 4.0 ng/mL   Troponin I <0.30  <0.30 ng/mL   Relative Index RELATIVE INDEX  IS INVALID  0.0 - 2.5   No intake or output data in the 24 hours ending 11/16/11 0850  EKG:  NSR, prolonged QT deep T wave inversion.  11/16/2011  ASSESSMENT AND PLAN:   Atrial fibrillation:  Now in NSR.  She will not need long term anticoagulation.     Essential hypertension, benign:  BP actually running low.     Troponin Elevation: Non diagnostic for MI.  No MB elevation.  Secondary to atrial fibrillation.   Hypokalemia:  Needs much supplementation.  I suspect this and afib are related to poor diet and increased alcohol intake.  I will educate.      Rollene Rotunda 11/16/2011 8:50 AM

## 2011-11-16 NOTE — Progress Notes (Signed)
Correction, Dr. Mayford Knife [facility]) was paged about critical Potassium 2.4. Nurse still waiting for return call from Dr. Kathrynn Running, Glory Rosebush

## 2011-11-16 NOTE — Progress Notes (Signed)
Jessica with [facility] returned call about critical Potassium. And informed the nurse that she will place order for potassium. Harmon Pier

## 2011-11-17 ENCOUNTER — Encounter (HOSPITAL_COMMUNITY): Payer: Self-pay | Admitting: Nurse Practitioner

## 2011-11-17 DIAGNOSIS — R778 Other specified abnormalities of plasma proteins: Secondary | ICD-10-CM

## 2011-11-17 LAB — BASIC METABOLIC PANEL
CO2: 23 mEq/L (ref 19–32)
Calcium: 9.1 mg/dL (ref 8.4–10.5)
Chloride: 105 mEq/L (ref 96–112)
Creatinine, Ser: 0.62 mg/dL (ref 0.50–1.10)
Glucose, Bld: 98 mg/dL (ref 70–99)

## 2011-11-17 LAB — CBC
HCT: 38.2 % (ref 36.0–46.0)
Hemoglobin: 13.1 g/dL (ref 12.0–15.0)
MCH: 32.9 pg (ref 26.0–34.0)
MCV: 96 fL (ref 78.0–100.0)
RBC: 3.98 MIL/uL (ref 3.87–5.11)

## 2011-11-17 NOTE — Progress Notes (Signed)
   SUBJECTIVE:  No pain.  No SOB. No complaints   PHYSICAL EXAM Filed Vitals:   11/16/11 0404 11/16/11 1328 11/16/11 2003 11/17/11 0638  BP: 100/68 113/78 112/80 106/73  Pulse: 66 65 73 72  Temp: 97.4 F (36.3 C) 98 F (36.7 C) 98 F (36.7 C) 98 F (36.7 C)  TempSrc: Oral Oral Oral Oral  Resp: 18 18 18 18   Height:      Weight:      SpO2: 100% 100% 100% 98%   General:  No distress Lungs:  Clear Heart:  RRR, no murmur. Abdomen:  Positive bowel sounds, no rebound no guarding Extremities:  No edema.    LABS: Lab Results  Component Value Date   CKTOTAL 37 11/16/2011   CKMB 3.1 11/16/2011   TROPONINI <0.30 11/16/2011   Results for orders placed during the hospital encounter of 11/15/11 (from the past 24 hour(s))  CBC     Status: Normal   Collection Time   11/17/11  5:41 AM      Component Value Range   WBC 4.3  4.0 - 10.5 K/uL   RBC 3.98  3.87 - 5.11 MIL/uL   Hemoglobin 13.1  12.0 - 15.0 g/dL   HCT 16.1  09.6 - 04.5 %   MCV 96.0  78.0 - 100.0 fL   MCH 32.9  26.0 - 34.0 pg   MCHC 34.3  30.0 - 36.0 g/dL   RDW 40.9  81.1 - 91.4 %   Platelets 172  150 - 400 K/uL  BASIC METABOLIC PANEL     Status: Abnormal   Collection Time   11/17/11  5:41 AM      Component Value Range   Sodium 138  135 - 145 mEq/L   Potassium 5.2 (*) 3.5 - 5.1 mEq/L   Chloride 105  96 - 112 mEq/L   CO2 23  19 - 32 mEq/L   Glucose, Bld 98  70 - 99 mg/dL   BUN 6  6 - 23 mg/dL   Creatinine, Ser 7.82  0.50 - 1.10 mg/dL   Calcium 9.1  8.4 - 95.6 mg/dL   GFR calc non Af Amer >90  >90 mL/min   GFR calc Af Amer >90  >90 mL/min    Intake/Output Summary (Last 24 hours) at 11/17/11 0904 Last data filed at 11/16/11 1835  Gross per 24 hour  Intake    240 ml  Output      0 ml  Net    240 ml    EKG:  NSR, QT has shortened.  T wave inversion less prominent.  11/17/2011  ASSESSMENT AND PLAN:   Atrial fibrillation:  Now in NSR.  She will not need long term anticoagulation.     Essential hypertension, benign:  BP  actually OK.     Troponin Elevation: Non diagnostic for MI.  No MB elevation.  Secondary to atrial fibrillation.  Echo with normal wall motion.  Otherwise unremarkable echo.   Hypokalemia:  upplemented.  I suspect this and afib are related to poor diet and increased alcohol intake.  She needs to avoid the excessive beer that she was drinking.  She needs a BMET early next week. Send results to me.   Follow up with extender within 2 weeks.  Fayrene Fearing [facility] 11/17/2011 9:04 AM

## 2011-11-17 NOTE — Progress Notes (Signed)
Pt. Discharged 11/17/2011  12:14 PM Discharge instructions reviewed with patient/family. Patient/family verbalized understanding. All Rx's given. Questions answered as needed. Pt. Discharged to home with family. Patient had questions about need for appt. To evaluate thyroid nodules, unaware of nodules.  Zaron Zwiefelhofer Diane   At 12:14 PM

## 2011-11-17 NOTE — Discharge Summary (Signed)
Patient ID: Daine Croker,  MRN: [id number], DOB/AGE: 10-01-1960 51 y.o.  Admit date: 11/15/2011 Discharge date: 11/17/2011  Primary Care Provider: Sanda Linger Primary Cardiologist: J. Cariana Karge, MD  Discharge Diagnoses Principal Problem:  *Atrial fibrillation  -CHADS2=1 -> ASA Active Problems:  Essential hypertension, benign  Elevated troponin  -Peak of 0.32  Coronary atherosclerosis of native coronary artery  -Nonobstructive by cath March 2012  GERD  ETOH Abuse  Hypokalemia  Thyroid nodules  -Noted incidentally on CT this admission.  F/U thyroid u/s recommended  Allergies Allergies  Allergen Reactions  . Penicillins Hives    Procedures  CTA Chest 11/15/2011  IMPRESSION:  1. There is no evidence of aortic dissection or periaortic leak. 2. Mild ectatic ascending aorta measures 3.1 cm in diameter. Descending aorta measures 2.4 cm in diameter. No mediastinal hematoma or adenopathy. 3. No pulmonary embolus. 4. No acute infiltrate or pulmonary edema. 5. Small low density nodules right lobe of thyroid gland. Further evaluation with thyroid gland ultrasound is recommended. _____________  2D Echocardiogram 11/16/2011  Study Conclusions  Left ventricle: The cavity size was normal. Wall thickness was normal. Systolic function was normal. The estimated ejection fraction was in the range of 55% to 60%. _____________  History of Present Illness  51 year old female with the above problem list.  She was in her usual state of health until a few weeks prior to admission when she began to experience progressive dyspnea exertion. On the morning prior to admission, she noted tachypalpitations associated with upper back and neck pressure as well as nausea and dyspnea.  Symptoms persisted into July 4, and patient presented to [facility] [place] where she was found to be in atrial fibrillation with rapid ventricular response. She had mild elevation of point-of-care troponin to 0.23. CT  angiography of the chest which did not show evidence of pulmonary embolism. She was incidentally found to have low density nodules of the right lobe of the thyroid gland.  Patient was placed on IV diltiazem with improved rate control.  She did report increase stress at home and at work and has been drinking 7 beers daily for the last few weeks. She was admitted for further evaluation.  Hospital Course  On IV diltiazem, patient converted to sinus rhythm. This was subsequently discontinued her home dose of beta blocker was reinstituted. She did have very mild elevation of her troponin to 0.32. She has prior known history of nonobstructive CAD.  2-D echocardiogram was carried out and showed normal LV function without regional wall motion abnormalities. Troponin leak was felt to be secondary to rapid A. Fib. CKs and MBs remained normal. She has a CHADS2 score of 1 (hypertension). She's been maintained on aspirin therapy. She was hypokalemic on July 5 and this was supplemented. We have arranged for followup basic metabolic profile on July 10. Patient be discharged home today in good condition. She has been counseled on the importance of alcohol avoidance.  Discharge Vitals Blood pressure 106/73, pulse 72, temperature 98 F (36.7 C), temperature source Oral, resp. rate 18, height 5\' 7"  (1.702 m), weight 140 lb 3.4 oz (63.6 kg), last menstrual period 09/15/2011, SpO2 98.00%.  Filed Weights   11/15/11 1752 11/15/11 2100  Weight: 130 lb (58.968 kg) 140 lb 3.4 oz (63.6 kg)   Labs  CBC  Basename 11/17/11 0541 11/16/11 0450 11/15/11 1434  WBC 4.3 5.4 --  NEUTROABS -- -- 6.8  HGB 13.1 15.5* --  HCT 38.2 42.4 --  MCV 96.0 91.2 --  PLT  172 196 --   Basic Metabolic Panel  Basename 11/17/11 0541 11/16/11 0740 11/16/11 0450 11/15/11 1834  NA 138 -- 135 --  K 5.2* 2.6* -- --  CL 105 -- 95* --  CO2 23 -- 22 --  GLUCOSE 98 -- 97 --  BUN 6 -- 8 --  CREATININE 0.62 -- 0.70 --  CALCIUM 9.1 -- 9.1 --  MG  -- -- -- 2.1  PHOS -- -- -- --   Cardiac Enzymes  Basename 11/16/11 0800 11/16/11 0033 11/15/11 1834  CKTOTAL 37 41 49  CKMB 3.1 3.6 4.0  CKMBINDEX -- -- --  TROPONINI <0.30 0.32* 0.31*   D-Dimer  Basename 11/15/11 1448  DDIMER <0.22   Thyroid Function Tests  Basename 11/15/11 1834  TSH 2.147  T4TOTAL --  T3FREE --  THYROIDAB --    Disposition  Pt is being discharged home today in good condition.  Follow-up Plans & Appointments  Follow-up Information    Follow up with [facility] on 11/21/2011. (Lab only - Blood Chemistry - anytime between 8:30-12 or 2-4)       Follow up with Rollene Rotunda, MD. (Dr. Jenene Slicker NP or PA - we will arrange.)    Contact information:   [address] [place] [address] [phone]       Follow up with Sanda Linger, MD. (Follow-up in 1-2 wks re: thyroid nodules)    Contact information:   [address] [facility] [address] [place], [address] [place] [address] [phone]        Discharge Medications  Medication List  As of 11/17/2011 10:59 AM   TAKE these medications         aspirin 81 MG tablet   Take 81 mg by mouth daily.      carvedilol 6.25 MG tablet   Commonly known as: COREG   Take 1 tablet (6.25 mg total) by mouth 2 (two) times daily with a meal.      ESTROVEN PO   Take 1 tablet by mouth daily.      ferrous sulfate 325 (65 FE) MG tablet   Take 325 mg by mouth daily.      ibuprofen 200 MG tablet   Commonly known as: ADVIL,MOTRIN   Take 200 mg by mouth every 8 (eight) hours as needed. for headaches      potassium chloride SA 20 MEQ tablet   Commonly known as: K-DUR,KLOR-CON   Take 20 mEq by mouth 2 (two) times daily.      PREVACID 24HR PO   Take 1 capsule by mouth daily.      triamterene-hydrochlorothiazide 37.5-25 MG per tablet   Commonly known as: MAXZIDE-25   TAKE ONE TABLET BY MOUTH EVERY DAY          Outstanding Labs/Studies  F/U  BMET on 11/21/2011  We have recommended followup with primary care for consideration of ultrasound of thyroid nodules on CT this admission.  Duration of Discharge Encounter   Greater than 30 minutes including physician time.  Signed, Nicolasa Ducking NP 11/17/2011, 10:59 AM   Patient seen and examined.  Plan as discussed in my rounding note for today and outlined above. Fayrene Fearing [facility]  11/17/2011  11:22 AM

## 2011-11-19 ENCOUNTER — Encounter: Payer: Self-pay | Admitting: *Deleted

## 2011-11-19 ENCOUNTER — Telehealth: Payer: Self-pay | Admitting: Cardiology

## 2011-11-19 NOTE — Telephone Encounter (Signed)
New Problem:    Patient called in needing a release to work sent in to her job (fax: [phone]) so she can work tom,orrow.  Please call back if you have any questions.

## 2011-11-19 NOTE — Telephone Encounter (Signed)
REVIEWED PT'S  RECORDS WITH Norma Fredrickson NP  PT MAY RETURN TO WORK  ON 11-20-11 NOTE FAXED TO  NUMBER LISTED IN MESSAGE .Zack Seal

## 2011-11-21 ENCOUNTER — Ambulatory Visit (INDEPENDENT_AMBULATORY_CARE_PROVIDER_SITE_OTHER): Payer: Self-pay | Admitting: *Deleted

## 2011-11-21 DIAGNOSIS — I10 Essential (primary) hypertension: Secondary | ICD-10-CM

## 2011-11-22 LAB — BASIC METABOLIC PANEL
BUN: 11 mg/dL (ref 6–23)
CO2: 28 mEq/L (ref 19–32)
Calcium: 9.2 mg/dL (ref 8.4–10.5)
GFR: 98.71 mL/min (ref >60.00)
Glucose, Bld: 86 mg/dL (ref 70–99)
Potassium: 3.8 mEq/L (ref 3.5–5.1)

## 2011-11-26 ENCOUNTER — Encounter: Payer: Self-pay | Admitting: Physician Assistant

## 2011-12-11 ENCOUNTER — Other Ambulatory Visit: Payer: Self-pay | Admitting: Cardiology

## 2011-12-11 NOTE — Telephone Encounter (Signed)
..   Requested Prescriptions   Pending Prescriptions Disp Refills  . KLOR-CON M10 10 MEQ tablet [Pharmacy Med Name: KLOR-CON M10        TAB] 60 each 6    Sig: TAKE TWO TABLETS BY MOUTH EVERY DAY

## 2012-02-05 ENCOUNTER — Other Ambulatory Visit: Payer: Self-pay | Admitting: Cardiology

## 2012-02-05 NOTE — Telephone Encounter (Signed)
..   Requested Prescriptions   Pending Prescriptions Disp Refills  . carvedilol (COREG) 6.25 MG tablet [Pharmacy Med Name: CARVEDILOL 6.25MG    TAB] 60 tablet 4    Sig: TAKE ONE TABLET BY MOUTH TWICE DAILY WITH A MEAL

## 2012-05-17 ENCOUNTER — Other Ambulatory Visit: Payer: Self-pay | Admitting: Cardiology

## 2012-05-19 ENCOUNTER — Other Ambulatory Visit: Payer: Self-pay

## 2012-05-19 MED ORDER — TRIAMTERENE-HCTZ 37.5-25 MG PO TABS: 1.0000 | ORAL_TABLET | Freq: Every day | ORAL | Status: DC

## 2012-05-19 NOTE — Telephone Encounter (Signed)
..   Requested Prescriptions   Pending Prescriptions Disp Refills  . triamterene-hydrochlorothiazide (MAXZIDE-25) 37.5-25 MG per tablet [Pharmacy Med Name: TRIAMT/HCTZ 37.5-25 TAB] 30 tablet 5    Sig: TAKE ONE TABLET BY MOUTH EVERY DAY

## 2012-05-20 ENCOUNTER — Other Ambulatory Visit: Payer: Self-pay

## 2012-05-20 MED ORDER — TRIAMTERENE-HCTZ 37.5-25 MG PO TABS: 1.0000 | ORAL_TABLET | Freq: Every day | ORAL | Status: DC

## 2012-05-20 NOTE — Telephone Encounter (Signed)
..   Requested Prescriptions   Signed Prescriptions Disp Refills  . triamterene-hydrochlorothiazide (MAXZIDE-25) 37.5-25 MG per tablet 30 tablet 5    Sig: Take 1 each (1 tablet total) by mouth daily.    Authorizing Provider: Rollene Rotunda    Ordering User: Christella Hartigan, Francisco Ostrovsky Judie Petit

## 2012-06-14 ENCOUNTER — Emergency Department (HOSPITAL_COMMUNITY): Payer: Self-pay

## 2012-06-14 ENCOUNTER — Emergency Department (HOSPITAL_COMMUNITY)
Admission: EM | Admit: 2012-06-14 | Discharge: 2012-06-14 | Disposition: A | Discharge status: Home / Self Care | Payer: Self-pay | Attending: Emergency Medicine | Admitting: Emergency Medicine

## 2012-06-14 ENCOUNTER — Encounter (HOSPITAL_COMMUNITY): Payer: Self-pay | Admitting: Emergency Medicine

## 2012-06-14 DIAGNOSIS — Z7982 Long term (current) use of aspirin: Secondary | ICD-10-CM | POA: Insufficient documentation

## 2012-06-14 DIAGNOSIS — I10 Essential (primary) hypertension: Secondary | ICD-10-CM | POA: Insufficient documentation

## 2012-06-14 DIAGNOSIS — W1809XA Striking against other object with subsequent fall, initial encounter: Secondary | ICD-10-CM | POA: Insufficient documentation

## 2012-06-14 DIAGNOSIS — S2220XA Unspecified fracture of sternum, initial encounter for closed fracture: Secondary | ICD-10-CM | POA: Insufficient documentation

## 2012-06-14 DIAGNOSIS — Z9861 Coronary angioplasty status: Secondary | ICD-10-CM | POA: Insufficient documentation

## 2012-06-14 DIAGNOSIS — K219 Gastro-esophageal reflux disease without esophagitis: Secondary | ICD-10-CM | POA: Insufficient documentation

## 2012-06-14 DIAGNOSIS — W010XXA Fall on same level from slipping, tripping and stumbling without subsequent striking against object, initial encounter: Secondary | ICD-10-CM | POA: Insufficient documentation

## 2012-06-14 DIAGNOSIS — Y929 Unspecified place or not applicable: Secondary | ICD-10-CM | POA: Insufficient documentation

## 2012-06-14 DIAGNOSIS — Z79899 Other long term (current) drug therapy: Secondary | ICD-10-CM | POA: Insufficient documentation

## 2012-06-14 DIAGNOSIS — R011 Cardiac murmur, unspecified: Secondary | ICD-10-CM | POA: Insufficient documentation

## 2012-06-14 DIAGNOSIS — Z8673 Personal history of transient ischemic attack (TIA), and cerebral infarction without residual deficits: Secondary | ICD-10-CM | POA: Insufficient documentation

## 2012-06-14 DIAGNOSIS — Z8679 Personal history of other diseases of the circulatory system: Secondary | ICD-10-CM | POA: Insufficient documentation

## 2012-06-14 DIAGNOSIS — Y939 Activity, unspecified: Secondary | ICD-10-CM | POA: Insufficient documentation

## 2012-06-14 DIAGNOSIS — S20219A Contusion of unspecified front wall of thorax, initial encounter: Secondary | ICD-10-CM | POA: Insufficient documentation

## 2012-06-14 MED ORDER — OXYCODONE-ACETAMINOPHEN 5-325 MG PO TABS
2.0000 | ORAL_TABLET | Freq: Once | ORAL | Status: AC
  Administered 2012-06-14: 2 via ORAL
  Filled 2012-06-14: qty 2

## 2012-06-14 MED ORDER — OXYCODONE-ACETAMINOPHEN 5-325 MG PO TABS: 2.0000 | ORAL_TABLET | ORAL | Status: DC | PRN

## 2012-06-14 NOTE — ED Notes (Signed)
No changes, some improvement with pain med, rates 9/10, EDP into room at time of d/c, remains alert, NAD, calm, interactive, resps e/ guarded & unlabored. verbalizes & demonstrates understanding of IS, given IS, Rx x1, work note and f/u with PCP, husband at [facility].

## 2012-06-14 NOTE — ED Notes (Signed)
Pt states she tripped and fell in her house on Monday.  C/o difficulty taking a deep breath and pressure in center of chest since fall.  Denies any other pain.  Ecchymosis noted to chest and R side of face.  Denies LOC.  Denies neck and back pain.

## 2012-06-14 NOTE — ED Notes (Signed)
Pt alert, NAD, calm, interactive, resps guarded, unlabored & shallow, pain worse with movement, pain meds given, family at [facility], pt to xray via w/c.

## 2012-06-14 NOTE — ED Provider Notes (Addendum)
History     CSN: [id number]  Arrival date & time 06/14/12  0506   First MD Initiated Contact with Patient 06/14/12 [phone]      Chief Complaint  Patient presents with  . Fall  . Chest Pain    (Consider location/radiation/quality/duration/timing/severity/associated sxs/prior treatment) HPI 52 female presents to emergency department with complaint of anterior chest pain. Patient reports she had a mechanical fall on Monday, tripped over a rug and struck her chest on a chair. She denies any LOC, denies any headache or neck pain. She has pain with taking a deep breath, and with palpation of her chest wall. Patient has put off coming to the emergency department treatment she can take care of her at home. Pain has steadily increased. Past Medical History  Diagnosis Date  . GERD (gastroesophageal reflux disease)   . Murmur     a. 11/2011 Echo: ER 55-60%, Mild TR  . Hypertension   . Chest pain at rest     a. 07/2010 Cath: nonobs dzs, EF 65%  . Exertional dyspnea     "when I have it"  . Headache     "I get headaches quite often"  . Atrial fibrillation     a. Dx 11/2011, broke on IV Dilt.  CHADS2=1-> ASA only  . ETOH abuse     Past Surgical History  Procedure Date  . None   . Cardiac catheterization 07/14/2010  . Dilation and curettage of uterus 2002    Family History  Problem Relation Age of Onset  . Hypertension      family Hx of  . Stroke      family Hx of 1st degree relative  . Heart failure Father     CHF  . Heart attack Mother     CVA's / MI  . Heart attack Sister 50    History  Substance Use Topics  . Smoking status: Never Smoker   . Smokeless tobacco: Never Used  . Alcohol Use: 16.8 oz/week    28 Cans of beer per week     Comment: 11/15/11 "4-5 beers after work ~ q night"    OB History    Grav Para Term Preterm Abortions TAB SAB Ect Mult Living                  Review of Systems  All other systems reviewed and are negative.    Allergies   Penicillins  Home Medications   Current Outpatient Rx  Name  Route  Sig  Dispense  Refill  . ASPIRIN 81 MG PO TABS   Oral   Take 81 mg by mouth daily.           Marland Kitchen CARVEDILOL 6.25 MG PO TABS      TAKE ONE TABLET BY MOUTH TWICE DAILY WITH A MEAL   60 tablet   4   . FERROUS SULFATE 325 (65 FE) MG PO TABS   Oral   Take 325 mg by mouth daily.           . IBUPROFEN 200 MG PO TABS   Oral   Take 200 mg by mouth every 8 (eight) hours as needed. for headaches         . KLOR-CON M10 10 MEQ PO TBCR      TAKE TWO TABLETS BY MOUTH EVERY DAY   60 each   6   . PREVACID 24HR PO   Oral   Take 1 capsule by mouth daily.           Marland Kitchen  ESTROVEN PO   Oral   Take 1 tablet by mouth daily.          Marland Kitchen POTASSIUM CHLORIDE CRYS ER 20 MEQ PO TBCR   Oral   Take 20 mEq by mouth 2 (two) times daily.          . TRIAMTERENE-HCTZ 37.5-25 MG PO TABS   Oral   Take 1 each (1 tablet total) by mouth daily.   30 tablet   5     BP 136/86  Pulse 73  Temp 97.9 F (36.6 C) (Oral)  Resp 25  SpO2 100%  LMP 09/15/2011  Physical Exam  Nursing note and vitals reviewed. Constitutional: She is oriented to person, place, and time. She appears well-developed and well-nourished.  HENT:  Head: Normocephalic and atraumatic.  Nose: Nose normal.  Mouth/Throat: Oropharynx is clear and moist.  Eyes: Conjunctivae normal and EOM are normal. Pupils are equal, round, and reactive to light.  Neck: Normal range of motion. Neck supple. No JVD present. No tracheal deviation present. No thyromegaly present.  Cardiovascular: Normal rate, regular rhythm, normal heart sounds and intact distal pulses.  Exam reveals no gallop and no friction rub.   No murmur heard. Pulmonary/Chest: Effort normal and breath sounds normal. No stridor. No respiratory distress. She has no wheezes. She has no rales. She exhibits tenderness (significant tenderness to anterior chest over sternum, no crepitus, step-off. Mild ecchymosis  noted).  Abdominal: Soft. Bowel sounds are normal. She exhibits no distension and no mass. There is no tenderness. There is no rebound and no guarding.  Musculoskeletal: Normal range of motion. She exhibits no edema and no tenderness.  Lymphadenopathy:    She has no cervical adenopathy.  Neurological: She is alert and oriented to person, place, and time. She exhibits normal muscle tone. Coordination normal.  Skin: Skin is warm and dry. No rash noted. No erythema. No pallor.  Psychiatric: She has a normal mood and affect. Her behavior is normal. Judgment and thought content normal.    ED Course  Procedures (including critical care time)  Labs Reviewed - No data to display Dg Chest 2 View  06/14/2012  *RADIOLOGY REPORT*  Clinical Data: Chest pain after fall 3 days ago.  CHEST - 2 VIEW  Comparison: 08/09/2009  Findings: Mild hyperinflation.  The heart size and pulmonary vascularity are normal. The lungs appear clear and expanded without focal air space disease or consolidation. No blunting of the costophrenic angles.  No pneumothorax.  Mediastinal contours appear intact.  No significant change since the previous study.  IMPRESSION: No evidence of active pulmonary disease.   Original Report Authenticated By: Burman Nieves, M.D.    Dg Sternum  06/14/2012  *RADIOLOGY REPORT*  Clinical Data: Chest pain after fall.  STERNUM - 2+ VIEW  Comparison: CT chest 11/15/2011.  Findings: Lateral view the sternum demonstrates suggestion of cortical irregularity of the inferior sternum.  Nondisplaced fracture is suggested.  This change appears to be new since the prior CT scan.  No focal bone lesion or bone destruction.  IMPRESSION: Cortical irregularity of the distal sternum suggesting nondisplaced fracture.   Original Report Authenticated By: Burman Nieves, M.D.     Date: 06/14/2012  Rate:88  Rhythm: normal sinus rhythm  QRS Axis: right  Intervals: QT prolonged  ST/T Wave abnormalities: normal  Conduction  Disutrbances:none  Narrative Interpretation:   Old EKG Reviewed: none available    1. Chest wall contusion   2. Sternal fracture  MDM  52 year old female status post fall 6 days ago with persistent pain. Will check chest x-ray and sternal views. Will treat pain.        Olivia Mackie, MD 06/14/12 1610  Olivia Mackie, MD 06/14/12 [id number]  Olivia Mackie, MD 06/14/12 [phone]

## 2012-06-30 ENCOUNTER — Encounter: Payer: Self-pay | Admitting: Internal Medicine

## 2012-06-30 ENCOUNTER — Telehealth: Payer: Self-pay | Admitting: Internal Medicine

## 2012-06-30 ENCOUNTER — Ambulatory Visit (INDEPENDENT_AMBULATORY_CARE_PROVIDER_SITE_OTHER)
Admission: RE | Admit: 2012-06-30 | Discharge: 2012-06-30 | Disposition: A | Discharge status: Home / Self Care | Payer: Self-pay | Source: Ambulatory Visit | Attending: Internal Medicine | Admitting: Internal Medicine

## 2012-06-30 ENCOUNTER — Ambulatory Visit (INDEPENDENT_AMBULATORY_CARE_PROVIDER_SITE_OTHER): Payer: Self-pay | Admitting: Internal Medicine

## 2012-06-30 VITALS — BP 138/82 | HR 73 | Temp 97.5°F | Ht 67.0 in | Wt 144.0 lb

## 2012-06-30 DIAGNOSIS — IMO0001 Reserved for inherently not codable concepts without codable children: Secondary | ICD-10-CM

## 2012-06-30 DIAGNOSIS — S2220XG Unspecified fracture of sternum, subsequent encounter for fracture with delayed healing: Secondary | ICD-10-CM

## 2012-06-30 DIAGNOSIS — W19XXXA Unspecified fall, initial encounter: Secondary | ICD-10-CM

## 2012-06-30 MED ORDER — OXYCODONE-ACETAMINOPHEN 5-325 MG PO TABS: 2.0000 | ORAL_TABLET | ORAL | Status: DC | PRN

## 2012-06-30 NOTE — Patient Instructions (Signed)
Sternal Fracture  The sternum is the bone in the center of the front of your chest which your ribs attach to. It is also called the breastbone. The most common cause of a sternal fracture (break in the bone) is an injury. The most common injury is from a motor vehicle accident. The fracture often comes from the seatbelt or hitting the chest on the steering wheel or being forcibly bent forward (shoulders towards your knees) during an accident. It is more common in females and the elderly. The fracture of the sternum is usually not a problem if there are no other injuries. Other injuries that may happen are to the ribs, heart, lungs, and abdominal organs.  SYMPTOMS   Common complaints from a fracture of the sternum include:   Shortness of breath.   Pain with breathing or difficulty breathing.   Bruises about the chest.   Tenderness or a cracking sound at the breastbone.  DIAGNOSIS   Your caregiver may be able to tell if the sternum is broken by examining you. Other times studies such as X-ray, CAT scan, ultrasound, and nuclear medicine are used to detect a fracture.   TREATMENT    Sternal fractures usually are not serious and if displacement is minimal, no treatment is necessary.   The main concern is with damage to the surrounding structures: ribs, heart, great vessels coming from the heart, and the back bone in the chest area.   Multiple rib fractures may cause breathing difficulties.   Injury to one of the large vessels in the chest may be a threat to life and require immediate surgery.   If injury to the heart or lungs is suspected it may be necessary to stay in the hospital and be monitored.   Other injuries will be treated as needed.   If the pieces of the breastbone are out of normal position, they may need to be reduced (put back in position) and then wired in place or fixed with a plate and screws during an operation.  HOME CARE INSTRUCTIONS    Avoid strenuous activity. Be careful during  activities and avoid bumping or re-injuring the injured sternum. Activities that cause pain pull on the fracture site(s) and are best avoided if possible.   Eat a normal, well-balanced diet. Drink plenty of fluids to avoid constipation, a common side effect of pain medications.   Take deep breaths and cough several times a day, splinting the injured area with a pillow. This will help prevent pneumonia.   Do not wear a rib belt or binder for the chest unless instructed otherwise. These restrict breathing and can lead to pneumonia.   Only take over-the-counter or prescription medicines for pain, discomfort, or fever as directed by your caregiver.  SEEK MEDICAL CARE IF:   You develop a continual cough, associated with thick or bloody mucus or phlegm (sputum).  SEEK IMMEDIATE MEDICAL CARE IF:    You have a fever.   You have increasing difficulty breathing.   You feel sick to your stomach (nausea), vomit, or have abdominal pain.   You have worsening pain, not controlled with medications.   You develop pain in the tops of your shoulders (in the shoulder strap area).   You feel lightheaded or faint.   You develop chest pain or an abnormal heart beat (palpitations).   You develop pain radiating into the jaw, teeth or down the arms.  Document Released: 12/13/2003 Document Revised: 07/23/2011 Document Reviewed: 08/02/2008  [employer] Patient Information 

## 2012-06-30 NOTE — Telephone Encounter (Signed)
Returned call to patient and listed phone number, received VM that states mail box is full and cannot received additional messages. Dr. Yetta Barre is out of the office until 07/08/12. Pt will need to see another MD here in the office, closing phone not due to unavailable number.

## 2012-06-30 NOTE — Telephone Encounter (Signed)
[facility] Triage Call Report Triage Record Num: [id number] Operator: Patriciaann Clan Patient Name: Christine Garcia Call Date & Time: 06/29/2012 7:10:40PM Patient Phone: [phone] PCP: Sanda Linger Patient Gender: Female PCP Fax : Patient DOB: 1961/01/14 Practice Name: [facility] Reason for Call: Caller: Matthew/Spouse; PCP: Sanda Linger (Adults only); CB#: [phone]; Call regarding Slipped On Ice. LMP-Menopausal . Spouse states patient slipped on ice landing on her back 06/28/12. States patient landed on an asphalt surface. No loss of consciousness. Denies neck or back pain. States patient complains of soreness on back of her head. States no contusion, abrasion or laceration noted. States patient denies back pain. States patient also fell against a chair, striking her chest on 06/14/12. States patient was evaluated at [facility] ED and diagnosed with a fractured sternum. States increased pain in sternal area since fall 06/28/12. States area is tender to touch. No difficulty breathing. Patient denies headache. Triage per Head Injury and Chest Trauma Protocol. Disposition of " See Provider within 4 hours" obtained related to positive triage assessment for " Chest Trauma resulting in tenderness over a specific point on chest." Spouse states patient declines to be seen in the ED. Patient requests appt. with Dr. Yetta Barre for 06/30/12. Possible consequences of delaying care explained to spouse including possible internal injury to heart/lungs. Verbalizes understanding. Care advice given per guidelines. Call back parameters reviewed. Spouse verbalizes understanding. Spouse declines offered appt. for 06/30/12. States patient needs an afternoon appt. Spouse states he will call ofifc e02/17/14 to schedule appt. Protocol(s) Used: Head Injury Recommended Outcome per Protocol: Provide Home/Self Care Reason for Outcome: All other situations Care Advice: Apply cloth-covered ice pack to affected  area to reduce swelling and decrease pain. Apply 20 minutes on, then 20 minutes off for first 48 hours. ~ After a head or face injury, call EMS 911 if the person is unable to answer simple questions, can't waken easily, has change in behavior, vomits more than 2 hours after injury, has double vision or new onset of difficulty walking, weakness or numbness. ~ Call provider for persistent headache that is not relieved with over-the-counter medication. ~ SYMPTOM / CONDITION MANAGEMENT

## 2012-06-30 NOTE — Progress Notes (Signed)
Subjective:    Patient ID: Christine Garcia, female    DOB: Dec 14, 1960, 52 y.o.   MRN: [id number]  HPI  Pt presents to the clinic today s/p fall on ice. This occurred 3 weeks ago. She did go to the ER and was diagnosed with a sternal fracture. She was given percocet. She is still having pain in her sternum but no difficulty breathing. 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 She is concerned that the fracture is not healing because of the pain.  Review of Systems      Past Medical History  Diagnosis Date  . GERD (gastroesophageal reflux disease)   . Murmur     a. 11/2011 Echo: ER 55-60%, Mild TR  . Hypertension   . Chest pain at rest     a. 07/2010 Cath: nonobs dzs, EF 65%  . Exertional dyspnea     "when I have it"  . Headache     "I get headaches quite often"  . Atrial fibrillation     a. Dx 11/2011, broke on IV Dilt.  CHADS2=1-> ASA only  . ETOH abuse     Current Outpatient Prescriptions  Medication Sig Dispense Refill  . aspirin 81 MG tablet Take 81 mg by mouth daily.        . carvedilol (COREG) 6.25 MG tablet Take 6.25 mg by mouth 2 (two) times daily with a meal.      . ferrous sulfate 325 (65 FE) MG tablet Take 325 mg by mouth daily.        Marland Kitchen ibuprofen (ADVIL,MOTRIN) 200 MG tablet Take 200 mg by mouth every 8 (eight) hours as needed. for headaches      . Lansoprazole (PREVACID 24HR PO) Take 1 capsule by mouth daily.        Marland Kitchen oxyCODONE-acetaminophen (PERCOCET/ROXICET) 5-325 MG per tablet Take 2 tablets by mouth every 4 (four) hours as needed for pain.  20 tablet  0  . potassium chloride SA (K-DUR,KLOR-CON) 20 MEQ tablet Take 20 mEq by mouth 2 (two) times daily.       Marland Kitchen triamterene-hydrochlorothiazide (MAXZIDE-25) 37.5-25 MG per tablet Take 1 each (1 tablet total) by mouth daily.  30 tablet  5   No current facility-administered medications for this visit.    Allergies  Allergen Reactions  . Penicillins Hives    Family History   Problem Relation Age of Onset  . Hypertension      family Hx of  . Stroke      family Hx of 1st degree relative  . Heart failure Father     CHF  . Heart attack Mother     CVA's / MI  . Heart attack Sister 39    History   Social History  . Marital Status: Married    Spouse Name: N/A    Number of Children: N/A  . Years of Education: N/A   Occupational History  . Server    Social History Main Topics  . Smoking status: Never Smoker   . Smokeless tobacco: Never Used  . Alcohol Use: 16.8 oz/week    28 Cans of beer per week     Comment: 11/15/11 "4-5 beers after work ~ q night"  . Drug Use: No  . Sexually Active: Yes   Other Topics Concern  . Not on file   Social History Narrative  . No narrative on file     Constitutional: Denies fever, malaise, fatigue, headache  or abrupt weight changes.  Respiratory: Denies difficulty breathing, shortness of breath, cough or sputum production.   Cardiovascular: Denies chest pain, chest tightness, palpitations or swelling in the hands or feet. . Musculoskeletal: Pt reports pain in her sternum. Denies decrease in range of motion, difficulty with gait, muscle pain or joint pain and swelling.    No other specific complaints in a complete review of systems (except as listed in HPI above).  Objective:   Physical Exam   BP 138/82  Pulse 73  Temp(Src) 97.5 F (36.4 C) (Oral)  Ht 5\' 7"  (1.702 m)  Wt 144 lb (65.318 kg)  BMI 22.55 kg/m2  SpO2 98%  LMP 09/15/2011 Wt Readings from Last 3 Encounters:  06/30/12 144 lb (65.318 kg)  11/15/11 140 lb 3.4 oz (63.6 kg)  08/23/11 146 lb (66.225 kg)    General: Appears her stated age, well developed, well nourished in NAD.  Cardiovascular: Normal rate and rhythm. S1,S2 noted.  No murmur, rubs or gallops noted. No JVD or BLE edema. No carotid bruits noted. Pulmonary/Chest: Normal effort and positive vesicular breath sounds. No respiratory distress. No wheezes, rales or ronchi noted.   Musculoskeletal: Pinpoint tenderness over the lower sternum. Normal range of motion. No signs of joint swelling. No difficulty with gait.        Assessment & Plan:   Sternal fracture s/p fall, additional workup required:  Will repeat chest xray to assess for worsening fracture Refilled percocet Letter given for light duty x 2 weeks  RTC as needed or if symptoms persist

## 2012-07-21 ENCOUNTER — Other Ambulatory Visit: Payer: Self-pay | Admitting: Cardiology

## 2012-08-11 ENCOUNTER — Other Ambulatory Visit: Payer: Self-pay | Admitting: Cardiology

## 2012-08-12 NOTE — Telephone Encounter (Signed)
..   Requested Prescriptions   Pending Prescriptions Disp Refills  . KLOR-CON M10 10 MEQ tablet [Pharmacy Med Name: KLOR-CON M10        TAB] 60 tablet 3    Sig: TAKE TWO TABLETS BY MOUTH EVERY DAY

## 2012-08-25 ENCOUNTER — Other Ambulatory Visit: Payer: Self-pay | Admitting: Cardiology

## 2012-09-28 ENCOUNTER — Other Ambulatory Visit: Payer: Self-pay | Admitting: Cardiology

## 2012-11-23 ENCOUNTER — Other Ambulatory Visit: Payer: Self-pay | Admitting: Cardiology

## 2012-12-09 ENCOUNTER — Other Ambulatory Visit: Payer: Self-pay | Admitting: Cardiology

## 2012-12-24 ENCOUNTER — Other Ambulatory Visit: Payer: Self-pay | Admitting: Cardiology

## 2012-12-30 ENCOUNTER — Other Ambulatory Visit: Payer: Self-pay

## 2012-12-30 MED ORDER — TRIAMTERENE-HCTZ 37.5-25 MG PO TABS: ORAL_TABLET | ORAL | Status: DC

## 2012-12-30 MED ORDER — CARVEDILOL 6.25 MG PO TABS: 6.2500 mg | ORAL_TABLET | Freq: Two times a day (BID) | ORAL | Status: DC

## 2012-12-30 MED ORDER — POTASSIUM CHLORIDE CRYS ER 20 MEQ PO TBCR: 20.0000 meq | EXTENDED_RELEASE_TABLET | Freq: Two times a day (BID) | ORAL | Status: DC

## 2013-05-08 ENCOUNTER — Emergency Department (HOSPITAL_COMMUNITY): Payer: Self-pay

## 2013-05-08 ENCOUNTER — Emergency Department (HOSPITAL_COMMUNITY)
Admission: EM | Admit: 2013-05-08 | Discharge: 2013-05-08 | Disposition: A | Discharge status: Home / Self Care | Payer: Self-pay | Attending: Emergency Medicine | Admitting: Emergency Medicine

## 2013-05-08 ENCOUNTER — Encounter (HOSPITAL_COMMUNITY): Payer: Self-pay | Admitting: Emergency Medicine

## 2013-05-08 DIAGNOSIS — Y939 Activity, unspecified: Secondary | ICD-10-CM | POA: Insufficient documentation

## 2013-05-08 DIAGNOSIS — I10 Essential (primary) hypertension: Secondary | ICD-10-CM | POA: Insufficient documentation

## 2013-05-08 DIAGNOSIS — R296 Repeated falls: Secondary | ICD-10-CM | POA: Insufficient documentation

## 2013-05-08 DIAGNOSIS — S22080A Wedge compression fracture of T11-T12 vertebra, initial encounter for closed fracture: Secondary | ICD-10-CM

## 2013-05-08 DIAGNOSIS — I4891 Unspecified atrial fibrillation: Secondary | ICD-10-CM | POA: Insufficient documentation

## 2013-05-08 DIAGNOSIS — W19XXXA Unspecified fall, initial encounter: Secondary | ICD-10-CM

## 2013-05-08 DIAGNOSIS — Z7982 Long term (current) use of aspirin: Secondary | ICD-10-CM | POA: Insufficient documentation

## 2013-05-08 DIAGNOSIS — R011 Cardiac murmur, unspecified: Secondary | ICD-10-CM | POA: Insufficient documentation

## 2013-05-08 DIAGNOSIS — Y929 Unspecified place or not applicable: Secondary | ICD-10-CM | POA: Insufficient documentation

## 2013-05-08 DIAGNOSIS — Z791 Long term (current) use of non-steroidal anti-inflammatories (NSAID): Secondary | ICD-10-CM | POA: Insufficient documentation

## 2013-05-08 DIAGNOSIS — Z880 Allergy status to penicillin: Secondary | ICD-10-CM | POA: Insufficient documentation

## 2013-05-08 DIAGNOSIS — K219 Gastro-esophageal reflux disease without esophagitis: Secondary | ICD-10-CM | POA: Insufficient documentation

## 2013-05-08 DIAGNOSIS — S22009A Unspecified fracture of unspecified thoracic vertebra, initial encounter for closed fracture: Secondary | ICD-10-CM | POA: Insufficient documentation

## 2013-05-08 DIAGNOSIS — Z79899 Other long term (current) drug therapy: Secondary | ICD-10-CM | POA: Insufficient documentation

## 2013-05-08 MED ORDER — IBUPROFEN 800 MG PO TABS: 800.0000 mg | ORAL_TABLET | Freq: Three times a day (TID) | ORAL | Status: DC

## 2013-05-08 MED ORDER — CYCLOBENZAPRINE HCL 10 MG PO TABS: 10.0000 mg | ORAL_TABLET | Freq: Every evening | ORAL | Status: DC | PRN

## 2013-05-08 MED ORDER — HYDROCODONE-ACETAMINOPHEN 5-325 MG PO TABS: 1.0000 | ORAL_TABLET | ORAL | Status: DC | PRN

## 2013-05-08 NOTE — ED Notes (Signed)
Per pt sts she fell Sunday night and since has been having some lower back pain.

## 2013-05-08 NOTE — ED Provider Notes (Signed)
CSN: [id number]     Arrival date & time 05/08/13  1108 History  This chart was scribed for non-physician practitioner Mellody Drown, PA-C working with Layla Maw Ward, DO by Joaquin Music, ED Scribe. This patient was seen in room TR10C/TR10C and the patient's care was started at 3:17 PM .   Chief Complaint  Patient presents with  . Back Pain   The history is provided by the patient. No language interpreter was used.   HPI Comments: Christine Garcia is a 52 y.o. female who presents to the Emergency Department complaining of lower back pain, more to the L lower back pain, due to a fall that occurred 1 weeks ago. Pt states she had a mechanical fell on the wet bathroom floor. She reports taking alive with minimal relief. Pt states she has been applying heat and relaxing to reduce her back pain. Aggravating factors include bending over, sitting and laying down.  Pt denies any other recent injuries/traumas. Pt denies numbness, tingling, or weakness to lower extremities and weakness.  Past Medical History  Diagnosis Date  . GERD (gastroesophageal reflux disease)   . Murmur     a. 11/2011 Echo: ER 55-60%, Mild TR  . Hypertension   . Chest pain at rest     a. 07/2010 Cath: nonobs dzs, EF 65%  . Exertional dyspnea     "when I have it"  . Headache(784.0)     "I get headaches quite often"  . Atrial fibrillation     a. Dx 11/2011, broke on IV Dilt.  CHADS2=1-> ASA only  . ETOH abuse    Past Surgical History  Procedure Laterality Date  . None    . Cardiac catheterization  07/14/2010  . Dilation and curettage of uterus  2002   Family History  Problem Relation Age of Onset  . Hypertension      family Hx of  . Stroke      family Hx of 1st degree relative  . Heart failure Father     CHF  . Heart attack Mother     CVA's / MI  . Heart attack Sister 35   History  Substance Use Topics  . Smoking status: Never Smoker   . Smokeless tobacco: Never Used  . Alcohol Use: 16.8 oz/week   28 Cans of beer per week     Comment: 11/15/11 "4-5 beers after work ~ q night"   OB History   Grav Para Term Preterm Abortions TAB SAB Ect Mult Living                 Review of Systems  Constitutional: Negative for fever and chills.  Genitourinary: Negative for difficulty urinating.  Musculoskeletal: Positive for back pain.  Neurological: Negative for weakness, light-headedness, numbness and headaches.  All other systems reviewed and are negative.    Allergies  Penicillins  Home Medications   Current Outpatient Rx  Name  Route  Sig  Dispense  Refill  . aspirin 81 MG tablet   Oral   Take 81 mg by mouth daily.           . Aspirin-Caffeine (BAYER BACK & BODY PAIN EX ST PO)   Oral   Take 2 tablets by mouth daily as needed (back pain).         . carvedilol (COREG) 6.25 MG tablet   Oral   Take 1 tablet (6.25 mg total) by mouth 2 (two) times daily with a meal.   60 tablet  6   . Cyanocobalamin (B-12 PO)   Oral   Take 1 tablet by mouth daily.         . ferrous sulfate 325 (65 FE) MG tablet   Oral   Take 325 mg by mouth daily.           . Lansoprazole (PREVACID 24HR PO)   Oral   Take 1 capsule by mouth daily.           . naproxen sodium (ANAPROX) 220 MG tablet   Oral   Take 440 mg by mouth 2 (two) times daily with a meal.         . potassium chloride SA (K-DUR,KLOR-CON) 20 MEQ tablet   Oral   Take 1 tablet (20 mEq total) by mouth 2 (two) times daily.   60 tablet   6   . triamterene-hydrochlorothiazide (MAXZIDE-25) 37.5-25 MG per tablet      TAKE ONE TABLET BY MOUTH ONCE DAILY   30 tablet   6    BP 145/94  Pulse 82  Temp(Src) 98.1 F (36.7 C) (Oral)  Resp 20  SpO2 98%  LMP 09/15/2011  Physical Exam  Nursing note and vitals reviewed. Constitutional: She appears well-developed and well-nourished.  Appears uncomfortable  HENT:  Head: Normocephalic and atraumatic.  Neck: Neck supple.  Pulmonary/Chest: Effort normal.  Musculoskeletal:  She exhibits tenderness.       Back:  No midline C-spine, T-spine, or L-spine tenderness with no step-offs, crepitus, or deformities noted.   Neurological: No sensory deficit. She exhibits normal muscle tone. Gait normal.  Reflex Scores:      Patellar reflexes are 2+ on the right side and 2+ on the left side. Psychiatric: She has a normal mood and affect. Her behavior is normal.    ED Course  Procedures  DIAGNOSTIC STUDIES: Oxygen Saturation is 98% on RA, normal by my interpretation.    COORDINATION OF CARE: 3:22 PM-Discussed treatment plan which includes D/C pt with muscle relaxer & pain medication for relief. Advised pt to avoid strenuous lifting and apply ice/heat to area. Pt agreed to plan.   Labs Review Labs Reviewed - No data to display Imaging Review Dg Lumbar Spine Complete  05/08/2013   CLINICAL DATA:  Fall, back pain  EXAM: LUMBAR SPINE - COMPLETE 4+ VIEW  COMPARISON:  06/30/2012  FINDINGS: Normal alignment. Lumbar vertebral body heights are maintained as well as the disk spaces. However, there is mild compression of the T12 superior endplate which appears new since 06/30/2012 suspicious for a T12 compression fracture, age-indeterminate.  No pars defects. Normal pedicles. SI joints are symmetric. Nonobstructive bowel gas pattern.  IMPRESSION: T12 superior endplate mild compression deformity suspicious for a fracture, age indeterminate.   Electronically Signed   By: Ruel Favors M.D.   On: 05/08/2013 12:40    EKG Interpretation   None      MDM   1. Compression fracture of T12 vertebra   2. Fall, initial encounter    Pt with a recent fall.  XR with T-12 compression fracture of indeterminate age.  History and PE without concerns for cord compression or caudal equina. Discussed imaging results, and treatment plan with the patient. Return precautions given. Reports understanding and no other concerns at this time.  Patient is stable for discharge at this time.    Meds  given in ED:  Medications - No data to display  Discharge Medication List as of 05/08/2013  3:34 PM    START taking  these medications   Details  cyclobenzaprine (FLEXERIL) 10 MG tablet Take 1 tablet (10 mg total) by mouth at bedtime as needed for muscle spasms., Starting 05/08/2013, Until Discontinued, Print    HYDROcodone-acetaminophen (NORCO/VICODIN) 5-325 MG per tablet Take 1 tablet by mouth every 4 (four) hours as needed., Starting 05/08/2013, Until Discontinued, Print    ibuprofen (ADVIL,MOTRIN) 800 MG tablet Take 1 tablet (800 mg total) by mouth 3 (three) times daily with meals., Starting 05/08/2013, Until Discontinued, Print       I personally performed the services described in this documentation, which was scribed in my presence. The recorded information has been reviewed and is accurate.     Clabe Seal, PA-C 05/10/13 1329

## 2013-05-08 NOTE — ED Notes (Signed)
C/O LOWER BACK PAIN SINCE SLIPPING AND FALLING IN HER BATHROOM Sunday. STATES WORKED Monday AND Tuesday WITH SEVERE PAIN. TODAY PAIN HAS WORSENED. DENIES PARESTHESIAS.

## 2013-05-08 NOTE — ED Notes (Signed)
PT. CURRENTLY IN X-RAY.  

## 2013-05-11 NOTE — ED Provider Notes (Signed)
Medical screening examination/treatment/procedure(s) were performed by non-physician practitioner and as supervising physician I was immediately available for consultation/collaboration.  EKG Interpretation   None         Layla Maw Ward, DO 05/11/13 [phone]

## 2013-09-27 ENCOUNTER — Other Ambulatory Visit: Payer: Self-pay | Admitting: Cardiology

## 2013-11-03 ENCOUNTER — Other Ambulatory Visit: Payer: Self-pay | Admitting: *Deleted

## 2013-11-03 MED ORDER — POTASSIUM CHLORIDE CRYS ER 20 MEQ PO TBCR: 20.0000 meq | EXTENDED_RELEASE_TABLET | Freq: Two times a day (BID) | ORAL | Status: DC

## 2013-12-18 ENCOUNTER — Encounter: Payer: Self-pay | Admitting: Cardiology

## 2013-12-18 ENCOUNTER — Ambulatory Visit (INDEPENDENT_AMBULATORY_CARE_PROVIDER_SITE_OTHER): Payer: BC Managed Care – PPO | Admitting: Cardiology

## 2013-12-18 VITALS — BP 124/84 | HR 66 | Ht 67.0 in | Wt 156.8 lb

## 2013-12-18 DIAGNOSIS — I10 Essential (primary) hypertension: Secondary | ICD-10-CM

## 2013-12-18 NOTE — Progress Notes (Signed)
HPI The patient presents for evaluation of chest discomfort. I saw her a few years ago. She has had minimal coronary disease. She was last seen in 2013 in our office following a brief episode of atrial fibrillation. She has since had no new cardiovascular problems. She is under emotional stress area in however, with this she's not describing any chest pressure, neck or arm discomfort. She's not having any palpitations, presyncope or syncope. She's not having any PND or orthopnea. She's not had any weight gain or edema. She doesn't feel symptoms that are consistent with her previous fibrillation..  Allergies  Allergen Reactions  . Penicillins Hives    Current Outpatient Prescriptions  Medication Sig Dispense Refill  . aspirin 81 MG tablet Take 81 mg by mouth daily.        . Aspirin-Caffeine (BAYER BACK & BODY PAIN EX ST PO) Take 2 tablets by mouth daily as needed (back pain).      . carvedilol (COREG) 6.25 MG tablet Take 1 tablet (6.25 mg total) by mouth 2 (two) times daily with a meal.  60 tablet  6  . Cyanocobalamin (B-12 PO) Take 1 tablet by mouth daily.      . cyclobenzaprine (FLEXERIL) 10 MG tablet Take 1 tablet (10 mg total) by mouth at bedtime as needed for muscle spasms.  20 tablet  0  . ferrous sulfate 325 (65 FE) MG tablet Take 325 mg by mouth daily.        Marland Kitchen. HYDROcodone-acetaminophen (NORCO/VICODIN) 5-325 MG per tablet Take 1 tablet by mouth every 4 (four) hours as needed.  10 tablet  0  . ibuprofen (ADVIL,MOTRIN) 800 MG tablet Take 1 tablet (800 mg total) by mouth 3 (three) times daily with meals.  21 tablet  0  . Lansoprazole (PREVACID 24HR PO) Take 1 capsule by mouth daily.        . naproxen sodium (ANAPROX) 220 MG tablet Take 440 mg by mouth 2 (two) times daily with a meal.      . potassium chloride SA (K-DUR,KLOR-CON) 20 MEQ tablet Take 1 tablet (20 mEq total) by mouth 2 (two) times daily.  60 tablet  1  . triamterene-hydrochlorothiazide (MAXZIDE-25) 37.5-25 MG per tablet  TAKE ONE TABLET BY MOUTH ONCE DAILY  30 tablet  6   No current facility-administered medications for this visit.    Past Medical History  Diagnosis Date  . GERD (gastroesophageal reflux disease)   . Murmur     a. 11/2011 Echo: ER 55-60%, Mild TR  . Hypertension   . Chest pain at rest     a. 07/2010 Cath: nonobs dzs, EF 65%  . Exertional dyspnea     "when I have it"  . Headache(784.0)     "I get headaches quite often"  . Atrial fibrillation     a. Dx 11/2011, broke on IV Dilt.  CHADS2=1-> ASA only  . ETOH abuse     Past Surgical History  Procedure Laterality Date  . None    . Cardiac catheterization  07/14/2010  . Dilation and curettage of uterus  2002    ROS:  As stated in the HPI and negative for all other systems.  PHYSICAL EXAM BP 124/84  Pulse 66  Ht 5\' 7"  (1.702 m)  Wt 156 lb 12.8 oz (71.124 kg)  BMI 24.55 kg/m2  LMP 09/15/2011 GENERAL:  Well appearing HEENT:  Pupils equal round and reactive, fundi not visualized, oral mucosa unremarkable NECK:  No jugular venous distention,  waveform within normal limits, carotid upstroke brisk and symmetric, no bruits, no thyromegaly LYMPHATICS:  No cervical, inguinal adenopathy LUNGS:  Clear to auscultation bilaterally BACK:  No CVA tenderness CHEST:  Unremarkable HEART:  PMI not displaced or sustained,S1 and S2 within normal limits, no S3, no S4, no clicks, no rubs, no murmurs ABD:  Flat, positive bowel sounds normal in frequency in pitch, no bruits, no rebound, no guarding, no midline pulsatile mass, no hepatomegaly, no splenomegaly EXT:  2 plus pulses throughout, no edema, no cyanosis no clubbing SKIN:  No rashes no nodules NEURO:  Cranial nerves II through XII grossly intact, motor grossly intact throughout PSYCH:  Cognitively intact, oriented to person place and time  EKG:  Sinus rhythm, please 66, premature atrial contractions, poor anterior R wave progression, no acute ST-T wave changes. 12/18/2013  ASSESSMENT AND  PLAN  ATRIAL FIB:  [name]. No further change in therapy is indicated.  CHEST PAIN:  She had minimal coronary disease in the past and review these records. She's had no new symptoms. No further change in therapy is indicated.  ANXIETY:  This seems to be most significant issue I asked her to discuss this with Dr. Yetta Barre as she likely will need treatment for this.

## 2013-12-18 NOTE — Patient Instructions (Signed)
Your physician recommends that you schedule a follow-up appointment in: 2 years with Dr. Hochrein  

## 2014-01-05 ENCOUNTER — Other Ambulatory Visit: Payer: Self-pay | Admitting: Cardiology

## 2014-01-12 ENCOUNTER — Ambulatory Visit (INDEPENDENT_AMBULATORY_CARE_PROVIDER_SITE_OTHER): Payer: BC Managed Care – PPO | Admitting: Family Medicine

## 2014-01-12 ENCOUNTER — Encounter: Payer: Self-pay | Admitting: Family Medicine

## 2014-01-12 VITALS — BP 112/82 | HR 85 | Ht 67.0 in | Wt 150.0 lb

## 2014-01-12 DIAGNOSIS — M7750 Other enthesopathy of unspecified foot: Secondary | ICD-10-CM

## 2014-01-12 DIAGNOSIS — M7741 Metatarsalgia, right foot: Secondary | ICD-10-CM | POA: Insufficient documentation

## 2014-01-12 DIAGNOSIS — M7742 Metatarsalgia, left foot: Principal | ICD-10-CM

## 2014-01-12 MED ORDER — MELOXICAM 15 MG PO TABS: 15.0000 mg | ORAL_TABLET | Freq: Every day | ORAL | Status: DC

## 2014-01-12 NOTE — Assessment & Plan Note (Signed)
Patient did have callus removal of the head of the metatarsal. We discussed proper shoe choices as well as motion of this could be beneficial. We discussed also using over-the-counter antifungal just in case this is an early athlete's foot. No significant skin breakdown noted in all in callus formation at this time. Patient was given some anti-inflammatories to use for short period of time. Patient does have a history of an acute MI and we discussed we did not want to do this long term. Patient will also do an icing regimen and then come back and see me again in 3-4 weeks.

## 2014-01-12 NOTE — Patient Instructions (Signed)
Very nice to meet you Wear good shoes when you can.  Lelon Frohlich, Dansko or New balance greater 700. Spenco orthotics  Ice bath 20 minutes at end of day.  Exercises 3 times a week.  Eurcerin 2 times daily  tenactin spray up to 2 times daily.  Come back in 3 weeks.

## 2014-01-12 NOTE — Progress Notes (Signed)
  [facility] [address] [place], [place] [address] Phone: [phone] Subjective:    I'm seeing this patient by the request  of:  Sanda Linger, MD   CC: Foot pain  [id number] Malena Timpone is a 53 y.o. female coming in with complaint of foot pain. Patient's the pain is bilateral. Patient has noticed this since she had new shoes. 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 Patient states that most of her pain is under the ball of her feet. Right greater than left. And states that it is tender to palpation. Patient states that it's more of a dull aching sensation with a sharp pain with certain steps. Patient states that he in the day she has difficulty even with walking. This is a very pain is 7/10. Denies any nighttime awakening. Denies any true injury to the area. Denies any swelling or any numbness of the feet.     Past medical history, social, surgical and family history all reviewed in electronic medical record.   Review of Systems: No headache, visual changes, nausea, vomiting, diarrhea, constipation, dizziness, abdominal pain, skin rash, fevers, chills, night sweats, weight loss, swollen lymph nodes, body aches, joint swelling, muscle aches, chest pain, shortness of breath, mood changes.   Objective Blood pressure 112/82, pulse 85, height  (1.702 m), weight 150 lb (68.04 kg), last menstrual period 09/15/2011, SpO2 98.00%.  General: No apparent distress alert and oriented x3 mood and affect normal, dressed appropriately.  HEENT: Pupils equal, extraocular movements intact  Respiratory: Patient's speak in full sentences and does not appear short of breath  Cardiovascular: No lower extremity edema, non tender, no erythema  Skin: Warm dry intact with no signs of infection or rash on extremities or on axial skeleton.  Abdomen: Soft nontender  Neuro: Cranial nerves II through XII are intact, neurovascularly intact in all  extremities with 2+ DTRs and 2+ pulses.  Lymph: No lymphadenopathy of posterior or anterior cervical chain or axillae bilaterally.  Gait normal with good balance and coordination.  MSK:  Non tender with full range of motion and good stability and symmetric strength and tone of shoulders, elbows, wrist, hip, knee and ankles bilaterally.  Patient's foot exam shows the patient does have pes cavus bilaterally. Patient also has mild breakdown of the transverse arch bilaterally. Patient calf patient over the ball of her feet bilaterally right greater than left. Patient does have one area of callus formation noted that is severely tender at the proximal aspect of the third metatarsal. Mild redness noted of all the toes on the plantar aspect.  After verbal consent patient was have debridement of a large callus formation on the plantar aspect of her foot. Nitrogen was placed in over the new skin. Patient tolerated the procedure well.    Impression and Recommendations:     This case required medical decision making of moderate complexity.

## 2014-01-14 ENCOUNTER — Other Ambulatory Visit: Payer: Self-pay | Admitting: Cardiology

## 2014-01-24 ENCOUNTER — Other Ambulatory Visit: Payer: Self-pay | Admitting: Cardiology

## 2014-02-01 ENCOUNTER — Ambulatory Visit (INDEPENDENT_AMBULATORY_CARE_PROVIDER_SITE_OTHER): Payer: BC Managed Care – PPO | Admitting: Internal Medicine

## 2014-02-01 ENCOUNTER — Ambulatory Visit (INDEPENDENT_AMBULATORY_CARE_PROVIDER_SITE_OTHER): Payer: BC Managed Care – PPO

## 2014-02-01 ENCOUNTER — Ambulatory Visit: Payer: Self-pay | Admitting: Internal Medicine

## 2014-02-01 ENCOUNTER — Encounter: Payer: Self-pay | Admitting: Internal Medicine

## 2014-02-01 VITALS — BP 122/82 | HR 78 | Temp 98.2°F | Resp 16 | Ht 67.0 in | Wt 150.0 lb

## 2014-02-01 DIAGNOSIS — I2510 Atherosclerotic heart disease of native coronary artery without angina pectoris: Secondary | ICD-10-CM

## 2014-02-01 DIAGNOSIS — Z23 Encounter for immunization: Secondary | ICD-10-CM

## 2014-02-01 DIAGNOSIS — K219 Gastro-esophageal reflux disease without esophagitis: Secondary | ICD-10-CM

## 2014-02-01 DIAGNOSIS — F418 Other specified anxiety disorders: Secondary | ICD-10-CM | POA: Insufficient documentation

## 2014-02-01 DIAGNOSIS — E785 Hyperlipidemia, unspecified: Secondary | ICD-10-CM

## 2014-02-01 DIAGNOSIS — I10 Essential (primary) hypertension: Secondary | ICD-10-CM

## 2014-02-01 DIAGNOSIS — E042 Nontoxic multinodular goiter: Secondary | ICD-10-CM

## 2014-02-01 DIAGNOSIS — F341 Dysthymic disorder: Secondary | ICD-10-CM

## 2014-02-01 LAB — URINALYSIS, ROUTINE W REFLEX MICROSCOPIC
BILIRUBIN URINE: NEGATIVE
Hgb urine dipstick: NEGATIVE
KETONES UR: NEGATIVE
LEUKOCYTES UA: NEGATIVE
Nitrite: NEGATIVE
Specific Gravity, Urine: 1.025 (ref 1.000–1.030)
Total Protein, Urine: NEGATIVE
URINE GLUCOSE: NEGATIVE
UROBILINOGEN UA: 0.2 (ref 0.0–1.0)
pH: 5.5 (ref 5.0–8.0)

## 2014-02-01 LAB — CBC WITH DIFFERENTIAL/PLATELET
Basophils Absolute: 0 10*3/uL (ref 0.0–0.1)
Basophils Relative: 0.2 % (ref 0.0–3.0)
EOS ABS: 0 10*3/uL (ref 0.0–0.7)
EOS PCT: 0.6 % (ref 0.0–5.0)
HCT: 39.5 % (ref 36.0–46.0)
HEMOGLOBIN: 13.6 g/dL (ref 12.0–15.0)
Lymphocytes Relative: 21.4 % (ref 12.0–46.0)
Lymphs Abs: 1.5 10*3/uL (ref 0.7–4.0)
MCHC: 34.4 g/dL (ref 30.0–36.0)
MCV: 98.9 fl (ref 78.0–100.0)
Monocytes Absolute: 0.4 10*3/uL (ref 0.1–1.0)
Monocytes Relative: 6.1 % (ref 3.0–12.0)
NEUTROS ABS: 4.9 10*3/uL (ref 1.4–7.7)
Neutrophils Relative %: 71.7 % (ref 43.0–77.0)
Platelets: 270 10*3/uL (ref 150.0–400.0)
RBC: 4 Mil/uL (ref 3.87–5.11)
RDW: 15.4 % (ref 11.5–15.5)
WBC: 6.8 10*3/uL (ref 4.0–10.5)

## 2014-02-01 LAB — COMPREHENSIVE METABOLIC PANEL
ALBUMIN: 3.9 g/dL (ref 3.5–5.2)
ALT: 66 U/L — ABNORMAL HIGH (ref 0–35)
AST: 95 U/L — AB (ref 0–37)
Alkaline Phosphatase: 195 U/L — ABNORMAL HIGH (ref 39–117)
BUN: 12 mg/dL (ref 6–23)
CO2: 22 mEq/L (ref 19–32)
CREATININE: 0.9 mg/dL (ref 0.4–1.2)
Calcium: 8.9 mg/dL (ref 8.4–10.5)
Chloride: 99 mEq/L (ref 96–112)
GFR: 70.52 mL/min (ref >60.00)
GLUCOSE: 97 mg/dL (ref 70–99)
Potassium: 4.2 mEq/L (ref 3.5–5.1)
Sodium: 137 mEq/L (ref 135–145)
Total Bilirubin: 0.8 mg/dL (ref 0.2–1.2)
Total Protein: 7.2 g/dL (ref 6.0–8.3)

## 2014-02-01 LAB — TSH: TSH: 2.98 u[IU]/mL (ref 0.35–4.50)

## 2014-02-01 MED ORDER — VILAZODONE HCL 10 & 20 & 40 MG PO KIT: 1.0000 | PACK | Freq: Every day | ORAL | Status: DC

## 2014-02-01 NOTE — Assessment & Plan Note (Signed)
At her request, will f/up on this with an u/s

## 2014-02-01 NOTE — Assessment & Plan Note (Signed)
Will start VIibryd to treat this

## 2014-02-01 NOTE — Progress Notes (Signed)
Subjective:    Patient ID: Christine Garcia, female    DOB: 26-Feb-1961, 53 y.o.   MRN: [id number]  Hypertension This is a chronic problem. The current episode started more than 1 year ago. The problem is unchanged. The problem is controlled. Associated symptoms include anxiety. Pertinent negatives include no blurred vision, chest pain, headaches, malaise/fatigue, neck pain, orthopnea, palpitations, peripheral edema, PND, shortness of breath or sweats. Agents associated with hypertension include NSAIDs. Past treatments include diuretics and beta blockers. The current treatment provides moderate improvement. There are no compliance problems.       Review of Systems  Constitutional: Negative.  Negative for fever, chills, malaise/fatigue, diaphoresis, appetite change and fatigue.  HENT: Negative.   Eyes: Negative.  Negative for blurred vision.  Respiratory: Negative.  Negative for apnea, cough, choking, chest tightness, shortness of breath, wheezing and stridor.   Cardiovascular: Negative.  Negative for chest pain, palpitations, orthopnea, leg swelling and PND.  Gastrointestinal: Negative.  Negative for nausea, vomiting, abdominal pain, diarrhea, constipation and blood in stool.  Endocrine: Negative.   Genitourinary: Negative.   Musculoskeletal: Positive for arthralgias. Negative for back pain, gait problem, joint swelling, myalgias, neck pain and neck stiffness.  Skin: Negative.  Negative for rash.  Allergic/Immunologic: Negative.   Neurological: Negative.  Negative for dizziness, tremors, seizures, syncope, facial asymmetry, speech difficulty, weakness, light-headedness, numbness and headaches.  Hematological: Negative.  Negative for adenopathy. Does not bruise/bleed easily.  Psychiatric/Behavioral: Positive for sleep disturbance and dysphoric mood. Negative for suicidal ideas, hallucinations, behavioral problems, confusion, self-injury, decreased concentration and agitation. The patient is  not nervous/anxious and is not hyperactive.        She complains of excess emotional stress causing sadness, irritability, insomnia, weight loss, anxiety.       Objective:   Physical Exam  Vitals reviewed. Constitutional: She is oriented to person, place, and time. She appears well-developed and well-nourished. No distress.  HENT:  Head: Normocephalic and atraumatic.  Mouth/Throat: Oropharynx is clear and moist. No oropharyngeal exudate.  Eyes: Conjunctivae are normal. Right eye exhibits no discharge. Left eye exhibits no discharge. No scleral icterus.  Neck: Normal range of motion. Neck supple. No JVD present. No tracheal deviation present. No thyromegaly present.  Cardiovascular: Normal rate, regular rhythm, normal heart sounds and intact distal pulses.  Exam reveals no gallop and no friction rub.   No murmur heard. Pulmonary/Chest: Effort normal and breath sounds normal. No stridor. No respiratory distress. She has no wheezes. She has no rales. She exhibits no tenderness.  Abdominal: Soft. Bowel sounds are normal. She exhibits no distension and no mass. There is no tenderness. There is no rebound and no guarding.  Musculoskeletal: Normal range of motion. She exhibits no edema and no tenderness.  Lymphadenopathy:    She has no cervical adenopathy.  Neurological: She is oriented to person, place, and time.  Skin: Skin is warm and dry. No rash noted. She is not diaphoretic. No erythema. No pallor.  Psychiatric: She has a normal mood and affect. Her behavior is normal. Judgment and thought content normal.     Lab Results  Component Value Date   WBC 4.3 11/17/2011   HGB 13.1 11/17/2011   HCT 38.2 11/17/2011   PLT 172 11/17/2011   GLUCOSE 86 11/21/2011   ALT 23 05/12/2010   AST 8 05/12/2010   NA 136 11/21/2011   K 3.8 11/21/2011   CL 100 11/21/2011   CREATININE 0.7 11/21/2011   BUN 11 11/21/2011   CO2 28  11/21/2011   TSH 2.147 11/15/2011   INR 1.0 07/13/2010       Assessment & Plan:

## 2014-02-01 NOTE — Assessment & Plan Note (Signed)
I will check her lipids and will consider starting a statin

## 2014-02-01 NOTE — Assessment & Plan Note (Signed)
Her BP is well controlled I will check her lytes and renal function today 

## 2014-02-01 NOTE — Progress Notes (Signed)
Pre visit review using our clinic review tool, if applicable. No additional management support is needed unless otherwise documented below in the visit note. 

## 2014-02-01 NOTE — Patient Instructions (Signed)

## 2014-02-02 ENCOUNTER — Telehealth: Payer: Self-pay | Admitting: Internal Medicine

## 2014-02-02 ENCOUNTER — Other Ambulatory Visit: Payer: Self-pay | Admitting: Internal Medicine

## 2014-02-02 DIAGNOSIS — E781 Pure hyperglyceridemia: Secondary | ICD-10-CM | POA: Insufficient documentation

## 2014-02-02 DIAGNOSIS — F418 Other specified anxiety disorders: Secondary | ICD-10-CM

## 2014-02-02 LAB — LIPID PANEL
Cholesterol: 271 mg/dL — ABNORMAL HIGH (ref 0–200)
HDL: 48.6 mg/dL (ref >39.00)
NONHDL: 222.4
Total CHOL/HDL Ratio: 6
Triglycerides: 1258 mg/dL — ABNORMAL HIGH (ref 0.0–149.0)
VLDL: 251.6 mg/dL — AB (ref 0.0–40.0)

## 2014-02-02 LAB — LDL CHOLESTEROL, DIRECT: LDL DIRECT: 85.5 mg/dL

## 2014-02-02 MED ORDER — OMEGA-3-ACID ETHYL ESTERS 1 G PO CAPS: 2.0000 g | ORAL_CAPSULE | Freq: Two times a day (BID) | ORAL | Status: DC

## 2014-02-02 MED ORDER — SERTRALINE HCL 50 MG PO TABS: 50.0000 mg | ORAL_TABLET | Freq: Every day | ORAL | Status: DC

## 2014-02-02 NOTE — Telephone Encounter (Signed)
Stop the Viibryd and start sertraline

## 2014-02-02 NOTE — Telephone Encounter (Signed)
Patient states Dr. Yetta Barre gave her vilazodone.  She took the smallest dose and was up all night.  She is requesting a call back in regards.

## 2014-02-05 NOTE — Telephone Encounter (Signed)
Pt notified//lmovm 

## 2014-02-25 ENCOUNTER — Encounter: Payer: Self-pay | Admitting: Internal Medicine

## 2014-02-25 ENCOUNTER — Ambulatory Visit (INDEPENDENT_AMBULATORY_CARE_PROVIDER_SITE_OTHER): Payer: BC Managed Care – PPO | Admitting: Internal Medicine

## 2014-02-25 ENCOUNTER — Other Ambulatory Visit (INDEPENDENT_AMBULATORY_CARE_PROVIDER_SITE_OTHER): Payer: BC Managed Care – PPO

## 2014-02-25 VITALS — BP 120/76 | HR 77 | Temp 98.7°F | Resp 16 | Ht 67.0 in | Wt 144.0 lb

## 2014-02-25 DIAGNOSIS — E781 Pure hyperglyceridemia: Secondary | ICD-10-CM

## 2014-02-25 DIAGNOSIS — R748 Abnormal levels of other serum enzymes: Secondary | ICD-10-CM | POA: Insufficient documentation

## 2014-02-25 DIAGNOSIS — F418 Other specified anxiety disorders: Secondary | ICD-10-CM

## 2014-02-25 DIAGNOSIS — G4701 Insomnia due to medical condition: Secondary | ICD-10-CM

## 2014-02-25 DIAGNOSIS — G8929 Other chronic pain: Secondary | ICD-10-CM

## 2014-02-25 DIAGNOSIS — F409 Phobic anxiety disorder, unspecified: Secondary | ICD-10-CM | POA: Insufficient documentation

## 2014-02-25 DIAGNOSIS — F5105 Insomnia due to other mental disorder: Secondary | ICD-10-CM

## 2014-02-25 LAB — COMPREHENSIVE METABOLIC PANEL
ALK PHOS: 190 U/L — AB (ref 39–117)
ALT: 53 U/L — ABNORMAL HIGH (ref 0–35)
AST: 80 U/L — ABNORMAL HIGH (ref 0–37)
Albumin: 3.1 g/dL — ABNORMAL LOW (ref 3.5–5.2)
BUN: 7 mg/dL (ref 6–23)
CALCIUM: 9.3 mg/dL (ref 8.4–10.5)
CHLORIDE: 89 meq/L — AB (ref 96–112)
CO2: 30 meq/L (ref 19–32)
Creatinine, Ser: 0.7 mg/dL (ref 0.4–1.2)
GFR: 96.18 mL/min (ref >60.00)
GLUCOSE: 122 mg/dL — AB (ref 70–99)
Potassium: 3.7 mEq/L (ref 3.5–5.1)
SODIUM: 130 meq/L — AB (ref 135–145)
Total Bilirubin: 1 mg/dL (ref 0.2–1.2)
Total Protein: 8 g/dL (ref 6.0–8.3)

## 2014-02-25 LAB — LIPID PANEL
CHOLESTEROL: 200 mg/dL (ref 0–200)
HDL: 81.5 mg/dL (ref >39.00)
LDL Cholesterol: 101 mg/dL — ABNORMAL HIGH (ref 0–99)
NonHDL: 118.5
TRIGLYCERIDES: 89 mg/dL (ref 0.0–149.0)
Total CHOL/HDL Ratio: 2
VLDL: 17.8 mg/dL (ref 0.0–40.0)

## 2014-02-25 MED ORDER — SUVOREXANT 20 MG PO TABS: 1.0000 | ORAL_TABLET | Freq: Every evening | ORAL | Status: DC | PRN

## 2014-02-25 NOTE — Progress Notes (Signed)
Subjective:    Patient ID: Christine Garcia, female    DOB: 16-Jul-1960, 53 y.o.   MRN: [id number]  Hyperlipidemia This is a recurrent problem. This is a new diagnosis. The problem is uncontrolled. Recent lipid tests were reviewed and are high. Exacerbating diseases include liver disease. She has no history of chronic renal disease, diabetes, hypothyroidism, obesity or nephrotic syndrome. Factors aggravating her hyperlipidemia include fatty foods and thiazides. Pertinent negatives include no chest pain, focal sensory loss, focal weakness, leg pain, myalgias or shortness of breath. Treatments tried: fish oils. The current treatment provides mild improvement of lipids. There are no compliance problems.       Review of Systems  Constitutional: Negative.  Negative for fever, chills, diaphoresis, appetite change and fatigue.  HENT: Negative.  Negative for trouble swallowing.   Eyes: Negative.   Respiratory: Negative.  Negative for cough, choking, chest tightness, shortness of breath and stridor.   Cardiovascular: Negative.  Negative for chest pain, palpitations and leg swelling.  Gastrointestinal: Negative.  Negative for nausea, vomiting, abdominal pain, diarrhea, constipation and blood in stool.  Endocrine: Negative.   Genitourinary: Negative.   Musculoskeletal: Negative.  Negative for myalgias.  Skin: Negative.   Allergic/Immunologic: Negative.   Neurological: Negative.  Negative for focal weakness.  Hematological: Negative.  Negative for adenopathy. Does not bruise/bleed easily.  Psychiatric/Behavioral: Positive for sleep disturbance. Negative for suicidal ideas, hallucinations, behavioral problems, confusion, self-injury, dysphoric mood, decreased concentration and agitation. The patient is nervous/anxious. The patient is not hyperactive.        Objective:   Physical Exam  Vitals reviewed. Constitutional: She is oriented to person, place, and time. She appears well-developed and  well-nourished.  Non-toxic appearance. She does not have a sickly appearance. She does not appear ill. No distress.  HENT:  Head: Normocephalic and atraumatic.  Mouth/Throat: Oropharynx is clear and moist. No oropharyngeal exudate.  Eyes: Conjunctivae are normal. Right eye exhibits no discharge. Left eye exhibits no discharge. No scleral icterus.  Neck: Normal range of motion. Neck supple. No JVD present. No tracheal deviation present. No thyromegaly present.  Cardiovascular: Normal rate, regular rhythm, normal heart sounds and intact distal pulses.  Exam reveals no gallop and no friction rub.   No murmur heard. Pulmonary/Chest: Effort normal and breath sounds normal. No stridor. No respiratory distress. She has no wheezes. She has no rales. She exhibits no tenderness.  Abdominal: Soft. Bowel sounds are normal. She exhibits no distension and no mass. There is no tenderness. There is no rebound and no guarding.  Musculoskeletal: Normal range of motion. She exhibits no edema and no tenderness.  Lymphadenopathy:    She has no cervical adenopathy.  Neurological: She is oriented to person, place, and time.  Skin: Skin is warm and dry. No rash noted. She is not diaphoretic. No erythema. No pallor.  Psychiatric: Her speech is normal and behavior is normal. Judgment and thought content normal. Her mood appears anxious. Her affect is not angry, not blunt, not labile and not inappropriate. Cognition and memory are normal. She does not exhibit a depressed mood. She expresses no homicidal and no suicidal ideation. She expresses no suicidal plans and no homicidal plans.     Lab Results  Component Value Date   WBC 6.8 02/01/2014   HGB 13.6 02/01/2014   HCT 39.5 02/01/2014   PLT 270.0 02/01/2014   GLUCOSE 97 02/01/2014   CHOL 271* 02/01/2014   TRIG 1258.0 Triglyceride is over 400; calculations on Lipids are invalid.* 02/01/2014  HDL 48.60 02/01/2014   LDLDIRECT 85.5 02/01/2014   ALT 66* 02/01/2014   AST 95*  02/01/2014   NA 137 02/01/2014   K 4.2 02/01/2014   CL 99 02/01/2014   CREATININE 0.9 02/01/2014   BUN 12 02/01/2014   CO2 22 02/01/2014   TSH 2.98 02/01/2014   INR 1.0 07/13/2010       Assessment & Plan:

## 2014-02-25 NOTE — Assessment & Plan Note (Signed)
She will not take sertraline due to her perception that it causes insomnia

## 2014-02-25 NOTE — Assessment & Plan Note (Signed)
She is on lovaza I will recheck her trigs today

## 2014-02-25 NOTE — Progress Notes (Signed)
Pre visit review using our clinic review tool, if applicable. No additional management support is needed unless otherwise documented below in the visit note. 

## 2014-02-25 NOTE — Patient Instructions (Signed)
Liver Profile A liver profile is a battery of tests which helps your caregiver evaluate your liver function. The following tests are often included in the liver profile: Alanine aminotransferase (ALT or SGPT) This is an enzyme found primarily in the liver. Abnormalities may represent liver disease. This is found in cells of the liver so when it is elevated, it has been released by damaged cells. Albumin - The serum albumin is one of the major proteins in the blood and a reflection of the general state of nutrition. This is low when the liver is unable to do its job. It is also low when protein is lost in the urine. NORMAL FINDINGS Adult/Elderly  Total protein: 6.4-8.3 g/dL or 64-83 g/L (SI units)  Albumin: 3.5-5 g/dL or 35-50 g/L (SI units)  Globulin: 2.3-3.4 g/dL  Alpha1 globulin: 0.1-0.3 g/dL or 1-3 g/L (SI units)  Alpha2 globulin: 0.6-1 g/dL or 6-10 g/L (SI units)  Beta globulin: 0.7-1.1 g/dL or 7-11 g/L (SI units) Children  Total protein  Premature infant: 4.2-7.6 g/dL  Newborn: 4.6-7.4 g/dL  Infant: 6-6.7 g/dL  Child: 6.2-8 g/dL  Albumin  Premature infant: 3-4.2 g/dL  Newborn: 3.5-5.4 g/dL  Infant: 4.4-5.4 g/dL  Child: 4-5.9 g/dL Albumin/Globulin ratio - Calculated by dividing the albumin by the globulin. It is a measure of well being.  Alkaline phosphatase - This is an enzyme which is important in diagnosing proper bone and liver functions. NORMAL FINDINGS Age / Normal Value (units/L)  0-5 days / 35-140  Less than 3 yr / 15-60  3-6 yr / 15-50  6-12 yr / 10-50  12-18 yr / 10-40  Adult / 0-35 units/L or 0-0.58 microKat/L (SI Units) (Females tend to have slightly lower levels than males)  Elderly / Slightly higher than adults Aspartate aminotransferase (AST or SGOT) - an enzyme found in skeletal and heart muscle, liver and other organs. Abnormalities may represent liver disease. This is found in cells of the liver so when it is elevated, it has been  released by damaged cells. Bilirubin, Total: A chemical involved with liver functions. High concentrations may result in jaundice. Jaundice is a yellowing of the skin and the whites of the eyes. NORMAL FINDINGS Blood  Adult/elderly/child  Total bilirubin: 0.3-1.0 mg/dL or 5.1-17 micromole/L (SI units)  Indirect bilirubin: 0.2-0.8 mg/dL or 3.4-12.0 micromole/L (SI units)  Direct bilirubin: 0.1-0.3 mg/dL or 1.7-5.1 micromole/L (SI units)  Newborn total bilirubin: 1.0-12.0 mg/dL or 17.1-205 micromole/L (SI units)  Urine0-0.02 mg/dL Ranges for normal findings may vary among different laboratories and hospitals. You should always check with your doctor after having lab work or other tests done to discuss the meaning of your test results and whether your values are considered within normal limits PREPARATION FOR TEST No preparation or fasting is necessary unless you have been informed otherwise. A blood sample is obtained by inserting a needle into a vein in the arm. MEANING OF TEST  Your caregiver will go over the test results with you and discuss the importance and meaning of your results, as well as treatment options and the need for additional tests if necessary. OBTAINING THE TEST RESULTS It is your responsibility to obtain your test results. Ask the lab or department performing the test when and how you will get your results. Document Released: 06/02/2004 Document Revised: 07/23/2011 Document Reviewed: 09/01/2013 [employer] Patient Information 2015 [employer]. This information is not intended to replace advice given to you by your health care provider. Make sure you discuss any questions   you have with your health care provider.  

## 2014-02-25 NOTE — Assessment & Plan Note (Signed)
I think this is related to her alcohol intake and I have asked her to stop drinking alcohol Will check her viral hepatitis today I am also concerned about fatty liver disease and have asked her to have an U/S done

## 2014-02-25 NOTE — Assessment & Plan Note (Signed)
She will try belsomra for this 

## 2014-02-26 ENCOUNTER — Encounter: Payer: Self-pay | Admitting: Internal Medicine

## 2014-02-26 LAB — HEPATITIS A ANTIBODY, TOTAL: HEP A TOTAL AB: BORDERLINE — AB

## 2014-02-26 LAB — HEPATITIS B CORE ANTIBODY, TOTAL: HEP B C TOTAL AB: NONREACTIVE

## 2014-02-26 LAB — HEPATITIS B SURFACE ANTIBODY,QUALITATIVE: Hep B S Ab: NEGATIVE

## 2014-02-26 LAB — HEPATITIS C ANTIBODY: HCV Ab: NEGATIVE

## 2014-03-01 ENCOUNTER — Encounter: Payer: Self-pay | Admitting: Internal Medicine

## 2014-03-01 ENCOUNTER — Telehealth: Payer: Self-pay | Admitting: Internal Medicine

## 2014-03-01 ENCOUNTER — Ambulatory Visit (INDEPENDENT_AMBULATORY_CARE_PROVIDER_SITE_OTHER): Payer: BC Managed Care – PPO | Admitting: Internal Medicine

## 2014-03-01 VITALS — BP 110/68 | HR 81 | Temp 98.3°F | Resp 16 | Ht 67.0 in | Wt 144.0 lb

## 2014-03-01 DIAGNOSIS — I10 Essential (primary) hypertension: Secondary | ICD-10-CM

## 2014-03-01 DIAGNOSIS — G43019 Migraine without aura, intractable, without status migrainosus: Secondary | ICD-10-CM

## 2014-03-01 MED ORDER — ELETRIPTAN HYDROBROMIDE 20 MG PO TABS: 20.0000 mg | ORAL_TABLET | ORAL | Status: DC | PRN

## 2014-03-01 NOTE — Patient Instructions (Signed)

## 2014-03-01 NOTE — Progress Notes (Signed)
Pre visit review using our clinic review tool, if applicable. No additional management support is needed unless otherwise documented below in the visit note. 

## 2014-03-01 NOTE — Progress Notes (Signed)
Subjective:    Patient ID: Christine Garcia, female    DOB: [birth year], 53 y.o.   MRN: [id number]  Headache  This is a recurrent problem. The current episode started 1 to 4 weeks ago. The problem occurs intermittently. The problem has been unchanged. The pain is located in the bilateral, frontal and vertex region. The pain does not radiate. The pain quality is similar to prior headaches. The quality of the pain is described as stabbing. The pain is at a severity of 2/10. The pain is mild. Associated symptoms include insomnia. Pertinent negatives include no abdominal pain, abnormal behavior, anorexia, back pain, blurred vision, coughing, dizziness, drainage, ear pain, eye pain, eye redness, eye watering, facial sweating, fever, hearing loss, loss of balance, muscle aches, nausea, neck pain, numbness, phonophobia, photophobia, rhinorrhea, scalp tenderness, seizures, sinus pressure, sore throat, swollen glands, tingling, tinnitus, visual change, vomiting, weakness or weight loss. The symptoms are aggravated by emotional stress. She has tried NSAIDs for the symptoms. The treatment provided mild relief. Her past medical history is significant for migraine headaches and migraines in the family.      Review of Systems  Constitutional: Negative.  Negative for fever, chills, weight loss, diaphoresis, appetite change and fatigue.  HENT: Negative for ear pain, hearing loss, rhinorrhea, sinus pressure, sore throat and tinnitus.   Eyes: Negative.  Negative for blurred vision, photophobia, pain, redness and visual disturbance.  Respiratory: Negative.  Negative for apnea, cough, choking, chest tightness, shortness of breath, wheezing and stridor.   Cardiovascular: Negative.  Negative for chest pain, palpitations and leg swelling.  Gastrointestinal: Negative.  Negative for nausea, vomiting, abdominal pain, diarrhea, constipation, blood in stool and anorexia.  Endocrine: Negative.   Genitourinary: Negative.     Musculoskeletal: Negative.  Negative for back pain and neck pain.  Skin: Negative.  Negative for rash.  Allergic/Immunologic: Negative.   Neurological: Positive for headaches. Negative for dizziness, tingling, seizures, weakness, numbness and loss of balance.  Hematological: Negative.  Negative for adenopathy. Does not bruise/bleed easily.  Psychiatric/Behavioral: The patient has insomnia.        Objective:   Physical Exam  Vitals reviewed. Constitutional: She is oriented to person, place, and time. She appears well-developed and well-nourished.  Non-toxic appearance. She does not have a sickly appearance. She does not appear ill. No distress.  HENT:  Head: Normocephalic and atraumatic.  Mouth/Throat: Oropharynx is clear and moist. No oropharyngeal exudate.  Eyes: Conjunctivae and EOM are normal. Pupils are equal, round, and reactive to light. Right eye exhibits no discharge. Left eye exhibits no discharge. No scleral icterus.  Neck: Normal range of motion. Neck supple. No JVD present. No tracheal deviation present. No thyromegaly present.  Cardiovascular: Normal rate, regular rhythm, normal heart sounds and intact distal pulses.  Exam reveals no gallop and no friction rub.   No murmur heard. Pulmonary/Chest: Effort normal and breath sounds normal. No stridor. No respiratory distress. She has no wheezes. She has no rales. She exhibits no tenderness.  Abdominal: Soft. Bowel sounds are normal. She exhibits no distension and no mass. There is no tenderness. There is no rebound and no guarding.  Musculoskeletal: Normal range of motion. She exhibits no edema and no tenderness.  Lymphadenopathy:    She has no cervical adenopathy.  Neurological: She is alert and oriented to person, place, and time. She has normal strength. She displays no atrophy, no tremor and normal reflexes. No cranial nerve deficit or sensory deficit. She exhibits normal muscle tone. She displays  a negative Romberg sign. She  displays no seizure activity. Coordination and gait normal. She displays no Babinski's sign on the right side. She displays no Babinski's sign on the left side.  Reflex Scores:      Tricep reflexes are 0 on the right side and 0 on the left side.      Bicep reflexes are 0 on the right side and 0 on the left side.      Brachioradialis reflexes are 0 on the right side and 0 on the left side.      Patellar reflexes are 0 on the right side and 0 on the left side.      Achilles reflexes are 0 on the right side and 0 on the left side. Skin: Skin is warm and dry. No rash noted. She is not diaphoretic. No erythema. No pallor.  Psychiatric: She has a normal mood and affect. Her behavior is normal. Judgment and thought content normal.          Assessment & Plan:

## 2014-03-01 NOTE — Assessment & Plan Note (Signed)
Her BP is well controlled 

## 2014-03-01 NOTE — Assessment & Plan Note (Signed)
She will try relpax and will continue mobic She was given pt ed material about migraines

## 2014-03-03 ENCOUNTER — Ambulatory Visit
Admission: RE | Admit: 2014-03-03 | Discharge: 2014-03-03 | Disposition: A | Discharge status: Home / Self Care | Payer: BC Managed Care – PPO | Source: Ambulatory Visit | Attending: Internal Medicine | Admitting: Internal Medicine

## 2014-03-03 ENCOUNTER — Encounter: Payer: Self-pay | Admitting: Internal Medicine

## 2014-03-03 DIAGNOSIS — R748 Abnormal levels of other serum enzymes: Secondary | ICD-10-CM

## 2014-03-04 ENCOUNTER — Telehealth: Payer: Self-pay | Admitting: Internal Medicine

## 2014-03-04 NOTE — Telephone Encounter (Signed)
Pt called wanting ultrasound and lab results.   Informed pt of the liver issue and she stated that she needed to quit drinking.

## 2014-03-04 NOTE — Telephone Encounter (Signed)
Duplicate msg.. see previous msg../lmb 

## 2014-03-04 NOTE — Telephone Encounter (Signed)
Pt does not use mychart and would prefer not. Patient would like a call from assistant with her ultrasound report.

## 2014-03-04 NOTE — Telephone Encounter (Signed)
LVM for husband to call back

## 2014-03-04 NOTE — Telephone Encounter (Signed)
She has an alcohol problem I think she should consider rehab

## 2014-03-04 NOTE — Telephone Encounter (Signed)
Pt husband called in and is worried about wife.  He said there is something still wrong with her. He is wanting to know if results have come back yet and if so would someone call him.     Best number [phone] cell number  Work number [phone]

## 2014-03-09 ENCOUNTER — Telehealth: Payer: Self-pay

## 2014-03-09 NOTE — Telephone Encounter (Signed)
Request for a generic of Relpax 20mg ?

## 2014-07-29 ENCOUNTER — Other Ambulatory Visit: Payer: Self-pay | Admitting: Orthopedic Surgery

## 2014-08-13 DIAGNOSIS — M720 Palmar fascial fibromatosis [Dupuytren]: Secondary | ICD-10-CM

## 2014-08-13 HISTORY — DX: Palmar fascial fibromatosis (dupuytren): M72.0

## 2014-08-30 ENCOUNTER — Encounter (HOSPITAL_BASED_OUTPATIENT_CLINIC_OR_DEPARTMENT_OTHER): Payer: Self-pay | Admitting: *Deleted

## 2014-08-30 NOTE — Pre-Procedure Instructions (Signed)
To come for BMET 

## 2014-09-01 ENCOUNTER — Encounter (HOSPITAL_BASED_OUTPATIENT_CLINIC_OR_DEPARTMENT_OTHER)
Admission: RE | Admit: 2014-09-01 | Discharge: 2014-09-01 | Disposition: A | Discharge status: Home / Self Care | Payer: 59 | Source: Ambulatory Visit | Attending: Orthopedic Surgery | Admitting: Orthopedic Surgery

## 2014-09-01 DIAGNOSIS — Z7982 Long term (current) use of aspirin: Secondary | ICD-10-CM | POA: Diagnosis not present

## 2014-09-01 DIAGNOSIS — I10 Essential (primary) hypertension: Secondary | ICD-10-CM | POA: Diagnosis not present

## 2014-09-01 DIAGNOSIS — K219 Gastro-esophageal reflux disease without esophagitis: Secondary | ICD-10-CM | POA: Diagnosis not present

## 2014-09-01 DIAGNOSIS — Z79899 Other long term (current) drug therapy: Secondary | ICD-10-CM | POA: Diagnosis not present

## 2014-09-01 DIAGNOSIS — M720 Palmar fascial fibromatosis [Dupuytren]: Secondary | ICD-10-CM | POA: Diagnosis present

## 2014-09-01 DIAGNOSIS — Z880 Allergy status to penicillin: Secondary | ICD-10-CM | POA: Diagnosis not present

## 2014-09-01 LAB — BASIC METABOLIC PANEL WITH GFR
Anion gap: 12 (ref 5–15)
BUN: 12 mg/dL (ref 6–23)
CO2: 23 mmol/L (ref 19–32)
Calcium: 8.7 mg/dL (ref 8.4–10.5)
Chloride: 96 mmol/L (ref 96–112)
Creatinine, Ser: 0.73 mg/dL (ref 0.50–1.10)
GFR calc Af Amer: >90 mL/min
GFR calc non Af Amer: >90 mL/min
Glucose, Bld: 93 mg/dL (ref 70–99)
Potassium: 4.6 mmol/L (ref 3.5–5.1)
Sodium: 131 mmol/L — ABNORMAL LOW (ref 135–145)

## 2014-09-02 ENCOUNTER — Encounter (HOSPITAL_BASED_OUTPATIENT_CLINIC_OR_DEPARTMENT_OTHER): Payer: Self-pay | Admitting: Orthopedic Surgery

## 2014-09-02 ENCOUNTER — Ambulatory Visit (HOSPITAL_BASED_OUTPATIENT_CLINIC_OR_DEPARTMENT_OTHER): Payer: 59 | Admitting: Anesthesiology

## 2014-09-02 ENCOUNTER — Encounter (HOSPITAL_BASED_OUTPATIENT_CLINIC_OR_DEPARTMENT_OTHER)
Admission: RE | Disposition: A | Discharge status: Home / Self Care | Payer: Self-pay | Source: Ambulatory Visit | Attending: Orthopedic Surgery

## 2014-09-02 ENCOUNTER — Ambulatory Visit (HOSPITAL_BASED_OUTPATIENT_CLINIC_OR_DEPARTMENT_OTHER)
Admission: RE | Admit: 2014-09-02 | Discharge: 2014-09-02 | Disposition: A | Discharge status: Home / Self Care | Payer: 59 | Source: Ambulatory Visit | Attending: Orthopedic Surgery | Admitting: Orthopedic Surgery

## 2014-09-02 DIAGNOSIS — M720 Palmar fascial fibromatosis [Dupuytren]: Secondary | ICD-10-CM | POA: Diagnosis not present

## 2014-09-02 DIAGNOSIS — K219 Gastro-esophageal reflux disease without esophagitis: Secondary | ICD-10-CM | POA: Insufficient documentation

## 2014-09-02 DIAGNOSIS — Z7982 Long term (current) use of aspirin: Secondary | ICD-10-CM | POA: Insufficient documentation

## 2014-09-02 DIAGNOSIS — Z880 Allergy status to penicillin: Secondary | ICD-10-CM | POA: Insufficient documentation

## 2014-09-02 DIAGNOSIS — Z79899 Other long term (current) drug therapy: Secondary | ICD-10-CM | POA: Insufficient documentation

## 2014-09-02 DIAGNOSIS — I10 Essential (primary) hypertension: Secondary | ICD-10-CM | POA: Insufficient documentation

## 2014-09-02 HISTORY — DX: Adverse effect of unspecified anesthetic, initial encounter: T41.45XA

## 2014-09-02 HISTORY — DX: Migraine, unspecified, not intractable, without status migrainosus: G43.909

## 2014-09-02 HISTORY — DX: Other complications of anesthesia, initial encounter: T88.59XA

## 2014-09-02 HISTORY — DX: Pure hyperglyceridemia: E78.1

## 2014-09-02 HISTORY — PX: FASCIECTOMY: SHX6525

## 2014-09-02 HISTORY — DX: Other seasonal allergic rhinitis: J30.2

## 2014-09-02 HISTORY — DX: Palmar fascial fibromatosis (dupuytren): M72.0

## 2014-09-02 HISTORY — DX: Personal history of other diseases of the circulatory system: Z86.79

## 2014-09-02 LAB — POCT HEMOGLOBIN-HEMACUE: Hemoglobin: 13.8 g/dL (ref 12.0–15.0)

## 2014-09-02 SURGERY — FASCIECTOMY, PALM
Anesthesia: General | Site: Finger | Laterality: Right

## 2014-09-02 MED ORDER — VANCOMYCIN HCL IN DEXTROSE 1-5 GM/200ML-% IV SOLN
1000.0000 mg | INTRAVENOUS | Status: AC
  Administered 2014-09-02: 1000 mg via INTRAVENOUS

## 2014-09-02 MED ORDER — CHLORHEXIDINE GLUCONATE 4 % EX LIQD: 60.0000 mL | Freq: Once | CUTANEOUS | Status: DC

## 2014-09-02 MED ORDER — MIDAZOLAM HCL 2 MG/2ML IJ SOLN
INTRAMUSCULAR | Status: AC
Start: 2014-09-02 — End: 2014-09-02
  Filled 2014-09-02: qty 2

## 2014-09-02 MED ORDER — HYDROCODONE-ACETAMINOPHEN 5-325 MG PO TABS: 1.0000 | ORAL_TABLET | Freq: Four times a day (QID) | ORAL | Status: DC | PRN

## 2014-09-02 MED ORDER — ONDANSETRON HCL 4 MG/2ML IJ SOLN
INTRAMUSCULAR | Status: DC | PRN
  Administered 2014-09-02: 4 mg via INTRAVENOUS

## 2014-09-02 MED ORDER — PROMETHAZINE HCL 25 MG/ML IJ SOLN: 6.2500 mg | INTRAMUSCULAR | Status: DC | PRN

## 2014-09-02 MED ORDER — LIDOCAINE HCL (CARDIAC) 20 MG/ML IV SOLN
INTRAVENOUS | Status: DC | PRN
  Administered 2014-09-02: 100 mg via INTRAVENOUS

## 2014-09-02 MED ORDER — THROMBIN 20000 UNITS EX KIT
PACK | CUTANEOUS | Status: DC | PRN
  Administered 2014-09-02: 20000 [IU] via TOPICAL

## 2014-09-02 MED ORDER — PROPOFOL 10 MG/ML IV BOLUS
INTRAVENOUS | Status: DC | PRN
  Administered 2014-09-02: 170 mg via INTRAVENOUS

## 2014-09-02 MED ORDER — LACTATED RINGERS IV SOLN
INTRAVENOUS | Status: DC
  Administered 2014-09-02 (×2): via INTRAVENOUS

## 2014-09-02 MED ORDER — PROPOFOL 10 MG/ML IV BOLUS
INTRAVENOUS | Status: AC
  Filled 2014-09-02: qty 20

## 2014-09-02 MED ORDER — BUPIVACAINE HCL (PF) 0.25 % IJ SOLN
INTRAMUSCULAR | Status: AC
  Filled 2014-09-02: qty 30

## 2014-09-02 MED ORDER — VANCOMYCIN HCL IN DEXTROSE 500-5 MG/100ML-% IV SOLN
INTRAVENOUS | Status: AC
  Filled 2014-09-02: qty 200

## 2014-09-02 MED ORDER — THROMBIN 5000 UNITS EX SOLR
CUTANEOUS | Status: AC
  Filled 2014-09-02: qty 5000

## 2014-09-02 MED ORDER — VANCOMYCIN HCL IN DEXTROSE 1-5 GM/200ML-% IV SOLN: 1000.0000 mg | INTRAVENOUS | Status: DC

## 2014-09-02 MED ORDER — KETOROLAC TROMETHAMINE 30 MG/ML IJ SOLN
INTRAMUSCULAR | Status: AC
  Filled 2014-09-02: qty 1

## 2014-09-02 MED ORDER — HYDROMORPHONE HCL 1 MG/ML IJ SOLN
INTRAMUSCULAR | Status: AC
  Filled 2014-09-02: qty 1

## 2014-09-02 MED ORDER — FENTANYL CITRATE (PF) 100 MCG/2ML IJ SOLN
INTRAMUSCULAR | Status: DC | PRN
  Administered 2014-09-02: 100 ug via INTRAVENOUS
  Administered 2014-09-02 (×3): 25 ug via INTRAVENOUS

## 2014-09-02 MED ORDER — BUPIVACAINE HCL (PF) 0.25 % IJ SOLN
INTRAMUSCULAR | Status: DC | PRN
  Administered 2014-09-02: 3 mL

## 2014-09-02 MED ORDER — MIDAZOLAM HCL 2 MG/ML PO SYRP: 12.0000 mg | ORAL_SOLUTION | Freq: Once | ORAL | Status: DC | PRN

## 2014-09-02 MED ORDER — KETOROLAC TROMETHAMINE 30 MG/ML IJ SOLN
30.0000 mg | Freq: Once | INTRAMUSCULAR | Status: AC
  Administered 2014-09-02: 30 mg via INTRAVENOUS

## 2014-09-02 MED ORDER — DEXAMETHASONE SODIUM PHOSPHATE 4 MG/ML IJ SOLN
INTRAMUSCULAR | Status: DC | PRN
  Administered 2014-09-02: 10 mg via INTRAVENOUS

## 2014-09-02 MED ORDER — OXYCODONE HCL 5 MG PO TABS
ORAL_TABLET | ORAL | Status: AC
  Filled 2014-09-02: qty 1

## 2014-09-02 MED ORDER — MIDAZOLAM HCL 2 MG/2ML IJ SOLN
1.0000 mg | INTRAMUSCULAR | Status: DC | PRN
  Administered 2014-09-02: 2 mg via INTRAVENOUS

## 2014-09-02 MED ORDER — HYDROMORPHONE HCL 1 MG/ML IJ SOLN
0.2500 mg | INTRAMUSCULAR | Status: DC | PRN
  Administered 2014-09-02 (×4): 0.5 mg via INTRAVENOUS

## 2014-09-02 MED ORDER — OXYCODONE HCL 5 MG PO TABS
5.0000 mg | ORAL_TABLET | Freq: Once | ORAL | Status: AC | PRN
  Administered 2014-09-02: 5 mg via ORAL

## 2014-09-02 MED ORDER — FENTANYL CITRATE (PF) 100 MCG/2ML IJ SOLN
INTRAMUSCULAR | Status: AC
  Filled 2014-09-02: qty 4

## 2014-09-02 MED ORDER — OXYCODONE HCL 5 MG/5ML PO SOLN: 5.0000 mg | Freq: Once | ORAL | Status: AC | PRN

## 2014-09-02 MED ORDER — CEFAZOLIN SODIUM-DEXTROSE 2-3 GM-% IV SOLR
INTRAVENOUS | Status: AC
  Filled 2014-09-02: qty 50

## 2014-09-02 SURGICAL SUPPLY — 46 items
BLADE MINI RND TIP GREEN BEAV (BLADE) ×2 IMPLANT
BLADE SURG 15 STRL LF DISP TIS (BLADE) ×1 IMPLANT
BLADE SURG 15 STRL SS (BLADE) ×1
BNDG COHESIVE 3X5 TAN STRL LF (GAUZE/BANDAGES/DRESSINGS) ×2 IMPLANT
BNDG ESMARK 4X9 LF (GAUZE/BANDAGES/DRESSINGS) ×2 IMPLANT
BNDG GAUZE ELAST 4 BULKY (GAUZE/BANDAGES/DRESSINGS) ×2 IMPLANT
CHLORAPREP W/TINT 26ML (MISCELLANEOUS) ×2 IMPLANT
CORDS BIPOLAR (ELECTRODE) ×2 IMPLANT
COVER BACK TABLE 60X90IN (DRAPES) ×2 IMPLANT
COVER MAYO STAND STRL (DRAPES) ×2 IMPLANT
CUFF TOURNIQUET SINGLE 18IN (TOURNIQUET CUFF) ×2 IMPLANT
DECANTER SPIKE VIAL GLASS SM (MISCELLANEOUS) IMPLANT
DRAPE EXTREMITY T 121X128X90 (DRAPE) ×2 IMPLANT
DRAPE SURG 17X23 STRL (DRAPES) ×2 IMPLANT
DRSG KUZMA FLUFF (GAUZE/BANDAGES/DRESSINGS) IMPLANT
GAUZE SPONGE 4X4 12PLY STRL (GAUZE/BANDAGES/DRESSINGS) ×4 IMPLANT
GAUZE XEROFORM 1X8 LF (GAUZE/BANDAGES/DRESSINGS) ×2 IMPLANT
GLOVE BIO SURGEON STRL SZ 6.5 (GLOVE) ×2 IMPLANT
GLOVE BIOGEL PI IND STRL 6.5 (GLOVE) ×1 IMPLANT
GLOVE BIOGEL PI IND STRL 7.0 (GLOVE) ×2 IMPLANT
GLOVE BIOGEL PI IND STRL 8.5 (GLOVE) ×1 IMPLANT
GLOVE BIOGEL PI INDICATOR 6.5 (GLOVE) ×1
GLOVE BIOGEL PI INDICATOR 7.0 (GLOVE) ×2
GLOVE BIOGEL PI INDICATOR 8.5 (GLOVE) ×1
GLOVE SURG ORTHO 8.0 STRL STRW (GLOVE) ×2 IMPLANT
GOWN STRL REUS W/ TWL LRG LVL3 (GOWN DISPOSABLE) ×1 IMPLANT
GOWN STRL REUS W/TWL LRG LVL3 (GOWN DISPOSABLE) ×1
GOWN STRL REUS W/TWL XL LVL3 (GOWN DISPOSABLE) ×2 IMPLANT
LOOP VESSEL MAXI BLUE (MISCELLANEOUS) ×2 IMPLANT
NS IRRIG 1000ML POUR BTL (IV SOLUTION) ×2 IMPLANT
PACK BASIN DAY SURGERY FS (CUSTOM PROCEDURE TRAY) ×2 IMPLANT
PAD CAST 3X4 CTTN HI CHSV (CAST SUPPLIES) ×1 IMPLANT
PADDING CAST ABS 3INX4YD NS (CAST SUPPLIES)
PADDING CAST ABS 4INX4YD NS (CAST SUPPLIES) ×1
PADDING CAST ABS COTTON 3X4 (CAST SUPPLIES) IMPLANT
PADDING CAST ABS COTTON 4X4 ST (CAST SUPPLIES) ×1 IMPLANT
PADDING CAST COTTON 3X4 STRL (CAST SUPPLIES) ×1
SLEEVE SCD COMPRESS KNEE MED (MISCELLANEOUS) ×2 IMPLANT
SPLINT PLASTER CAST XFAST 3X15 (CAST SUPPLIES) IMPLANT
SPLINT PLASTER XTRA FASTSET 3X (CAST SUPPLIES)
STOCKINETTE 4X48 STRL (DRAPES) ×2 IMPLANT
SUT SILK 2 0 FS (SUTURE) ×2 IMPLANT
SYR BULB 3OZ (MISCELLANEOUS) ×2 IMPLANT
SYR CONTROL 10ML LL (SYRINGE) ×2 IMPLANT
TOWEL OR 17X24 6PK STRL BLUE (TOWEL DISPOSABLE) ×4 IMPLANT
UNDERPAD 30X30 INCONTINENT (UNDERPADS AND DIAPERS) ×2 IMPLANT

## 2014-09-02 NOTE — Discharge Instructions (Addendum)
[facility] Instructions °Hand Surgery ° °Wound Care: °Keep your hand elevated above the level of your heart.  Do not allow it to dangle by your side.  Keep the dressing dry and do not remove it unless your doctor advises you to do so.  He will usually change it at the time of your post-op visit.  Moving your fingers is advised to stimulate circulation but will depend on the site of your surgery.  If you have a splint applied, your doctor will advise you regarding movement. ° °Activity: °Do not drive or operate machinery today.  Rest today and then you may return to your normal activity and work as indicated by your physician. ° °Diet:  °Drink liquids today or eat a light diet.  You may resume a regular diet tomorrow.   ° °General expectations: °Pain for two to three days. °Fingers may become slightly swollen. ° °Call your doctor if any of the following occur: °Severe pain not relieved by pain medication. °Elevated temperature. °Dressing soaked with blood. °Inability to move fingers. °White or bluish color to fingers. ° ° °Post Anesthesia Home Care Instructions ° °Activity: °Get plenty of rest for the remainder of the day. A responsible adult should stay with you for 24 hours following the procedure.  °For the next 24 hours, DO NOT: °-Drive a car °-Operate machinery °-Drink alcoholic beverages °-Take any medication unless instructed by your physician °-Make any legal decisions or sign important papers. ° °Meals: °Start with liquid foods such as gelatin or soup. Progress to regular foods as tolerated. Avoid greasy, spicy, heavy foods. If nausea and/or vomiting occur, drink only clear liquids until the nausea and/or vomiting subsides. Call your physician if vomiting continues. ° °Special Instructions/Symptoms: °Your throat may feel dry or sore from the anesthesia or the breathing tube placed in your throat during surgery. If this causes discomfort, gargle with warm salt water. The discomfort should disappear within 24  hours. ° °If you had a scopolamine patch placed behind your ear for the management of post- operative nausea and/or vomiting: ° °1. The medication in the patch is effective for 72 hours, after which it should be removed.  Wrap patch in a tissue and discard in the trash. Wash hands thoroughly with soap and water. °2. You may remove the patch earlier than 72 hours if you experience unpleasant side effects which may include dry mouth, dizziness or visual disturbances. °3. Avoid touching the patch. Wash your hands with soap and water after contact with the patch. °  °Call your surgeon if you experience:  ° °1.  Fever over 101.0. °2.  Inability to urinate. °3.  Nausea and/or vomiting. °4.  Extreme swelling or bruising at the surgical site. °5.  Continued bleeding from the incision. °6.  Increased pain, redness or drainage from the incision. °7.  Problems related to your pain medication. °8. Any change in color, movement and/or sensation °9. Any problems and/or concerns ° ° °

## 2014-09-02 NOTE — Anesthesia Postprocedure Evaluation (Signed)
Anesthesia Post Note  Patient: Christine Garcia  Procedure(s) Performed: Procedure(s) (LRB): RIGHT HAND FASCIECTOMY RIGHT RING AND RIGHT MIDDLE FINGER  (Right)  Anesthesia type: general  Patient location: PACU  Post pain: Pain level controlled  Post assessment: Patient's Cardiovascular Status Stable  Last Vitals:  Filed Vitals:   09/02/14 1130  BP: 167/82  Pulse: 67  Temp: 36.6 C  Resp: 18    Post vital signs: Reviewed and stable  Level of consciousness: sedated  Complications: No apparent anesthesia complications

## 2014-09-02 NOTE — Brief Op Note (Signed)
09/02/2014  9:46 AM  PATIENT:  Christine Garcia  54 y.o. female  PRE-OPERATIVE DIAGNOSIS:  DUPUYTREN'S CONTRACTION RIGHT RING MIDDLE   POST-OPERATIVE DIAGNOSIS:  dupuytrens contracture right ring and middle finger  PROCEDURE:  Procedure(s): RIGHT HAND FASCIECTOMY RIGHT RING AND RIGHT MIDDLE FINGER  (Right)  SURGEON:  Surgeon(s) and Role:    * Cindee SaltGary Derian Pfost, MD - Primary  PHYSICIAN ASSISTANT:   ASSISTANTS: none   ANESTHESIA:   local and general  EBL:  Total I/O In: 600 [I.V.:600] Out: -   BLOOD ADMINISTERED:none  DRAINS: Penrose drain in the right hand   LOCAL MEDICATIONS USED:  BUPIVICAINE   SPECIMEN:  Excision  DISPOSITION OF SPECIMEN:  PATHOLOGY  COUNTS:  YES  TOURNIQUET:   Total Tourniquet Time Documented: Upper Arm (Right) - 49 minutes Total: Upper Arm (Right) - 49 minutes   DICTATION: .Other Dictation: Dictation Number [id number]  PLAN OF CARE: Discharge to home after PACU  PATIENT DISPOSITION:  PACU - hemodynamically stable.

## 2014-09-02 NOTE — H&P (Signed)
Christine Garcia is a 54 year-old right-hand dominant female who comes in complaining of the inability to extend fingers especially on her right side ring finger, left side third finger, to a lesser extent the index, thumb web space. This states this has been going on for the past four to five months.  She is of [place] descent. She has nodules on her feet.  She does not know whether siblings or parents have had this.  She is not complaining of any significant numbness or tingling.  She is complaining of occasional discomfort, feeling of swelling. She has been taking ibuprofen.  She has no history of diabetes, thyroid problems, arthritis or gout.    ALLERGIES:     Penicillin.  MEDICATIONS:     Aspirin, Coreg, cyanocobalamin, Flexeril, ferrous sulfate, Norco/Vicodin, Advil/Motrin, Prevacid, Anaprox, K-Dur and Maxzide.  She is advised not to take two different NSAIDs at the same time.    FAMILY MEDICAL HISTORY:    Positive for heart disease, high blood pressure.    SURGICAL HISTORY:    None.  SOCIAL HISTORY:      She does not smoke, drinks socially, she is married, works as a Programme researcher, broadcasting/film/video at [employer].  REVIEW OF SYSTEMS:   Positive for high blood pressure, blood in her stools, otherwise negative 14 points.  Christine Garcia is an 53 y.o. female.   Chief Complaint: dupuytren's contracture right hand HPI: see above  Past Medical History  Diagnosis Date  . GERD (gastroesophageal reflux disease)   . Migraines     occasional  . Seasonal allergies   . History of atrial fibrillation     one occurrence - states K+ was low at the time  . Murmur     states no problems  . Hypertension     under control with med., has been on med. x 2 yr.  . High triglycerides   . Dupuytren's contracture of right hand 08/2014    ring/middle fingers  . Complication of anesthesia     states "is hard to numb"    Past Surgical History  Procedure Laterality Date  . Dilation and curettage of uterus  2002  . Cardiac  catheterization  07/14/2010    "nonobstructive CAD"    Family History  Problem Relation Age of Onset  . Heart attack Sister 48  . Stroke Mother   . Congestive Heart Failure Father   . Heart attack Mother    Social History:  reports that she has never smoked. She has never used smokeless tobacco. She reports that she drinks alcohol. She reports that she does not use illicit drugs.  Allergies:  Allergies  Allergen Reactions  . Penicillins Hives    Medications Prior to Admission  Medication Sig Dispense Refill  . aspirin 81 MG tablet Take 81 mg by mouth daily.      . carvedilol (COREG) 6.25 MG tablet TAKE ONE TABLET BY MOUTH TWICE DAILY WITH MEALS 60 tablet 11  . Cyanocobalamin (B-12 PO) Take 1 tablet by mouth daily.    . ferrous sulfate 325 (65 FE) MG tablet Take 325 mg by mouth daily.      Marland Kitchen KLOR-CON M20 20 MEQ tablet TAKE ONE TABLET BY MOUTH TWICE DAILY. **MUST KEEP 12/18/13 APPOINTMENT FOR FURTHER REFILLS** 180 tablet 6  . Lansoprazole (PREVACID 24HR PO) Take 1 capsule by mouth daily.      Marland Kitchen omega-3 acid ethyl esters (LOVAZA) 1 G capsule Take 2 capsules (2 g total) by mouth 2 (two) times daily.  180 capsule 3  . triamterene-hydrochlorothiazide (MAXZIDE-25) 37.5-25 MG per tablet TAKE ONE TABLET BY MOUTH ONCE DAILY 30 tablet 11    Results for orders placed or performed during the hospital encounter of 09/02/14 (from the past 48 hour(s))  Basic metabolic panel     Status: Abnormal   Collection Time: 09/01/14  2:10 PM  Result Value Ref Range   Sodium 131 (L) 135 - 145 mmol/L   Potassium 4.6 3.5 - 5.1 mmol/L   Chloride 96 96 - 112 mmol/L   CO2 23 19 - 32 mmol/L   Glucose, Bld 93 70 - 99 mg/dL   BUN 12 6 - 23 mg/dL   Creatinine, Ser 0.73 0.50 - 1.10 mg/dL   Calcium 8.7 8.4 - 10.5 mg/dL   GFR calc non Af Amer >90 >90 mL/min   GFR calc Af Amer >90 >90 mL/min    Comment: (NOTE) The eGFR has been calculated using the CKD EPI equation. This calculation has not been validated in all  clinical situations. eGFR's persistently <90 mL/min signify possible Chronic Kidney Disease.    Anion gap 12 5 - 15    No results found.   Pertinent items are noted in HPI.  Height _0  (1.702 m), weight 63.504 kg (140 lb), last menstrual period 09/15/2011.  General appearance: alert, cooperative and appears stated age Head: Normocephalic, without obvious abnormality Neck: no JVD Resp: clear to auscultation bilaterally Cardio: regular rate and rhythm, S1, S2 normal, no murmur, click, rub or gallop GI: soft, non-tender; bowel sounds normal; no masses,  no organomegaly Extremities: contracture right ring and middle fingers Pulses: 2+ and symmetric Skin: Skin color, texture, turgor normal. No rashes or lesions Neurologic: Grossly normal Incision/Wound: na  Assessment/Plan RADIOGRAPHS:    X-rays of her hand reveal no significant degenerative changes.   DIAGNOSIS:     Dupuytren's contracture.    RECOMMENDATIONS/PLAN:    We have had a long discussion with her and her husband regarding the etiology of this, the various treatment alternatives including aponeurotomy, Xiaflex injection, fasciotomy, fasciectomy along with risks and complications of each including loss of finger, recurrence, injury to arteries/nerves/tendons.   She would like to proceed with fasciectomy to the right hand.  This will be to the  middle and ring fingers.  She does not want the fasciotomy.  She wants to have the cord removed.    She is aware of the risks and complications, that there is no guarantee with the surgery, possibility of infection, recurrence, injury to arteries, nerves, tendons, incomplete relief of symptoms and dystrophy.   She is scheduled for fasciectomy right hand, ring and middle fingers.  Christine Garcia 09/02/2014, 7:37 AM

## 2014-09-02 NOTE — Op Note (Signed)
NAMEElita Garcia NO.:  [id number]  MEDICAL RECORD NO.:  [id number]  LOCATION:                                 FACILITY:  PHYSICIAN:  Cindee Salt, M.D.            DATE OF BIRTH:  DATE OF PROCEDURE:  09/02/2014 DATE OF DISCHARGE:                              OPERATIVE REPORT   PREOPERATIVE DIAGNOSIS:  Dupuytren's contracture, right ring and right middle fingers.  POSTOPERATIVE DIAGNOSIS:  Dupuytren's contracture, right ring and right middle fingers.  OPERATION:  All palmar fasciotomy, right ring finger with extension to the middle finger.  SURGEON:  Cindee Salt, M.D.  ASSISTANT:  None.  ANESTHESIA:  General with local infiltration.  ANESTHESIOLOGIST:  [name].  HISTORY:  The patient is a 54 year old female with a history of Dupuytren's contracture to the ring finger primarily with an extension to the middle.  She is desirous having this excised with a fasciectomy rather than fasciotomy, aponeurotomy, or Xiaflex injection.  She is aware of risks and complications including infection, recurrence of injury to arteries, nerves, tendons, incomplete relief of symptoms, dystrophy, injury to arteries, nerves, tendons including loss of finger. In the preoperative area, the patient is seen.  The extremity was marked by both patient and surgeon.  Antibiotic given.  PROCEDURE IN DETAIL:  The patient was brought to the operating room where general anesthetic was carried out without difficulty.  She was prepped using ChloraPrep, supine position with the right arm free.  A 3- minute dry time was allowed.  Time-out taken confirming the patient and procedure.  The limb was exsanguinated with an Esmarch bandage. Tourniquet placed high on the arm was inflated to 250 mmHg.  An incision was made for volar V-Y flaps in the right palm, based on the ring finger, carried down through subcutaneous tissue.  Neurovascular bundles were identified proximally.  The palmar fascia  to the small, ring, and middle fingers were transected.  The cord was excised to the palm from the middle finger.  The cord to the ring finger was substantial.  This was followed distally with a partial resection of the cord to the small finger also in the palm.  The dissection was carried distally to a large nodule present at the A2 pulley.  This was excised releasing the bands going around the A2 pulley, taking care to protect neurovascular bundles, radial and ulnarly.  She does not have a large artery on the ulnar side.  She had substantial artery on the radial side.  Both were protected.  Dissection again carried distally and extension to the middle finger through the natatory ligament was excised.  This allowed full extension of the middle finger.  The cord to the ring finger was followed distally to the level of the middle phalanx beneath the skin. The neurovascular bundles were protected.  The attachments were cut distally and the finger came entirely straight at both MP and IP joints of the ring finger.  The neurovascular bundles were explored over their entire length, were found to be intact radially and ulnarly.  The wound  was copiously irrigated with saline.  Marcaine was instilled 0.25% plain.  This was then followed after was absorbed by thrombin.  A doubled over vessel loop drain was placed to the base of the wounds, these converted to wise; and the skin closed with interrupted 4-0 nylon and 5-0 nylon sutures over the drain.  The specimen was sent to Pathology.  A sterile compressive dressing was applied.  On deflation of the tourniquet, all fingers immediately pinked.  Dorsal splint was applied.  She was taken to the recovery room for observation in satisfactory condition.  She will be discharged home [facility] [place] Vicodin.          ______________________________ Cindee SaltGary Damonique Brunelle, M.D.     GK/MEDQ  D:  09/02/2014  T:  09/02/2014   Job:  [id number]

## 2014-09-02 NOTE — Transfer of Care (Signed)
Immediate Anesthesia Transfer of Care Note  Patient: Christine Garcia  Procedure(s) Performed: Procedure(s): RIGHT HAND FASCIECTOMY RIGHT RING AND RIGHT MIDDLE FINGER  (Right)  Patient Location: PACU  Anesthesia Type:General  Level of Consciousness: awake, alert  and oriented  Airway & Oxygen Therapy: Patient Spontanous Breathing and Patient connected to face mask oxygen  Post-op Assessment: Report given to RN and Post -op Vital signs reviewed and stable  Post vital signs: Reviewed and stable  Last Vitals:  Filed Vitals:   09/02/14 0744  BP: 151/80  Pulse: 67  Temp: 36.5 C  Resp: 16    Complications: No apparent anesthesia complications

## 2014-09-02 NOTE — Anesthesia Preprocedure Evaluation (Addendum)
Anesthesia Evaluation  Patient identified by MRN, date of birth, ID band Patient awake    Reviewed: Allergy & Precautions, Patient's Chart, lab work & pertinent test results  History of Anesthesia Complications Negative for: history of anesthetic complications  Airway Mallampati: II  TM Distance: >3 FB Neck ROM: full    Dental  (+) Chipped, Dental Advidsory Given, Poor Dentition   Pulmonary neg pulmonary ROS,    Pulmonary exam normal       Cardiovascular hypertension, Pt. on home beta blockers and Pt. on medications + CAD  Study Conclusions Left ventricle: The cavity size was normal. Wall thickness was normal. Systolic function was normal. The estimated ejection fraction was in the range of 55% to 60%.    Neuro/Psych  Headaches, PSYCHIATRIC DISORDERS Anxiety Depression    GI/Hepatic Neg liver ROS, GERD-  ,  Endo/Other  negative endocrine ROS  Renal/GU negative Renal ROS     Musculoskeletal   Abdominal Normal abdominal exam  (+)   Peds  Hematology   Anesthesia Other Findings   Reproductive/Obstetrics                          Anesthesia Physical Anesthesia Plan  ASA: II  Anesthesia Plan: General   Post-op Pain Management:    Induction: Intravenous  Airway Management Planned: LMA  Additional Equipment:   Intra-op Plan:   Post-operative Plan: Extubation in OR  Informed Consent: I have reviewed the patients History and Physical, chart, labs and discussed the procedure including the risks, benefits and alternatives for the proposed anesthesia with the patient or authorized representative who has indicated his/her understanding and acceptance.   Dental Advisory Given  Plan Discussed with: CRNA, Anesthesiologist and Surgeon  Anesthesia Plan Comments:        Anesthesia Quick Evaluation

## 2014-09-02 NOTE — Op Note (Signed)
Dictated [id number]

## 2014-09-02 NOTE — Anesthesia Procedure Notes (Signed)
Procedure Name: LMA Insertion Date/Time: 09/02/2014 8:39 AM Performed by: Gar GibbonKEETON, Yacqub Baston S Pre-anesthesia Checklist: Patient identified, Emergency Drugs available, Suction available and Patient being monitored Patient Re-evaluated:Patient Re-evaluated prior to inductionOxygen Delivery Method: Circle System Utilized Preoxygenation: Pre-oxygenation with 100% oxygen Intubation Type: IV induction Ventilation: Mask ventilation without difficulty LMA: LMA inserted LMA Size: 4.0 Number of attempts: 1 Airway Equipment and Method: Bite block Placement Confirmation: positive ETCO2 Tube secured with: Tape Dental Injury: Teeth and Oropharynx as per pre-operative assessment

## 2014-09-03 ENCOUNTER — Encounter (HOSPITAL_BASED_OUTPATIENT_CLINIC_OR_DEPARTMENT_OTHER): Payer: Self-pay | Admitting: Orthopedic Surgery

## 2014-11-19 ENCOUNTER — Telehealth: Payer: Self-pay

## 2014-11-19 NOTE — Telephone Encounter (Signed)
Received pharmacy rejection stating that insurance will not cover Lovaza without a prior authorization. PA submitted to insurance via covermymeds, approval now pending their decision.

## 2014-12-30 ENCOUNTER — Ambulatory Visit (INDEPENDENT_AMBULATORY_CARE_PROVIDER_SITE_OTHER): Payer: 59 | Admitting: Internal Medicine

## 2014-12-30 ENCOUNTER — Other Ambulatory Visit (INDEPENDENT_AMBULATORY_CARE_PROVIDER_SITE_OTHER): Payer: 59

## 2014-12-30 ENCOUNTER — Telehealth: Payer: Self-pay | Admitting: Internal Medicine

## 2014-12-30 ENCOUNTER — Encounter: Payer: Self-pay | Admitting: Internal Medicine

## 2014-12-30 ENCOUNTER — Telehealth: Payer: Self-pay

## 2014-12-30 VITALS — BP 118/82 | HR 79 | Temp 98.0°F | Ht 67.0 in | Wt 154.0 lb

## 2014-12-30 DIAGNOSIS — R748 Abnormal levels of other serum enzymes: Secondary | ICD-10-CM

## 2014-12-30 DIAGNOSIS — I10 Essential (primary) hypertension: Secondary | ICD-10-CM | POA: Diagnosis not present

## 2014-12-30 DIAGNOSIS — E785 Hyperlipidemia, unspecified: Secondary | ICD-10-CM | POA: Diagnosis not present

## 2014-12-30 DIAGNOSIS — I2510 Atherosclerotic heart disease of native coronary artery without angina pectoris: Secondary | ICD-10-CM

## 2014-12-30 DIAGNOSIS — Z1231 Encounter for screening mammogram for malignant neoplasm of breast: Secondary | ICD-10-CM | POA: Insufficient documentation

## 2014-12-30 DIAGNOSIS — G90511 Complex regional pain syndrome I of right upper limb: Secondary | ICD-10-CM

## 2014-12-30 LAB — CBC WITH DIFFERENTIAL/PLATELET
BASOS ABS: 0.1 10*3/uL (ref 0.0–0.1)
Basophils Relative: 0.6 % (ref 0.0–3.0)
EOS ABS: 0.3 10*3/uL (ref 0.0–0.7)
Eosinophils Relative: 2.5 % (ref 0.0–5.0)
HEMATOCRIT: 41.4 % (ref 36.0–46.0)
HEMOGLOBIN: 14.1 g/dL (ref 12.0–15.0)
LYMPHS PCT: 18.6 % (ref 12.0–46.0)
Lymphs Abs: 1.9 10*3/uL (ref 0.7–4.0)
MCHC: 34.1 g/dL (ref 30.0–36.0)
MCV: 95.9 fl (ref 78.0–100.0)
MONOS PCT: 6.8 % (ref 3.0–12.0)
Monocytes Absolute: 0.7 10*3/uL (ref 0.1–1.0)
NEUTROS ABS: 7.4 10*3/uL (ref 1.4–7.7)
Neutrophils Relative %: 71.5 % (ref 43.0–77.0)
PLATELETS: 326 10*3/uL (ref 150.0–400.0)
RBC: 4.32 Mil/uL (ref 3.87–5.11)
RDW: 12.8 % (ref 11.5–15.5)
WBC: 10.4 10*3/uL (ref 4.0–10.5)

## 2014-12-30 LAB — COMPREHENSIVE METABOLIC PANEL
ALBUMIN: 4.5 g/dL (ref 3.5–5.2)
ALK PHOS: 104 U/L (ref 39–117)
ALT: 15 U/L (ref 0–35)
AST: 21 U/L (ref 0–37)
BUN: 17 mg/dL (ref 6–23)
CALCIUM: 9.7 mg/dL (ref 8.4–10.5)
CO2: 27 mEq/L (ref 19–32)
CREATININE: 1.14 mg/dL (ref 0.40–1.20)
Chloride: 102 mEq/L (ref 96–112)
GFR: 52.81 mL/min — ABNORMAL LOW (ref >60.00)
Glucose, Bld: 92 mg/dL (ref 70–99)
Potassium: 4.7 mEq/L (ref 3.5–5.1)
Sodium: 137 mEq/L (ref 135–145)
Total Bilirubin: 0.4 mg/dL (ref 0.2–1.2)
Total Protein: 7.4 g/dL (ref 6.0–8.3)

## 2014-12-30 LAB — LIPID PANEL
CHOLESTEROL: 262 mg/dL — AB (ref 0–200)
HDL: 69.2 mg/dL (ref >39.00)
LDL Cholesterol: 164 mg/dL — ABNORMAL HIGH (ref 0–99)
NonHDL: 192.67
Total CHOL/HDL Ratio: 4
Triglycerides: 143 mg/dL (ref 0.0–149.0)
VLDL: 28.6 mg/dL (ref 0.0–40.0)

## 2014-12-30 LAB — TSH: TSH: 1.51 u[IU]/mL (ref 0.35–4.50)

## 2014-12-30 MED ORDER — TRAMADOL HCL 50 MG PO TABS: 50.0000 mg | ORAL_TABLET | Freq: Four times a day (QID) | ORAL | Status: DC | PRN

## 2014-12-30 NOTE — Progress Notes (Signed)
Subjective:  Patient ID: Christine Garcia, female    DOB: 1961/04/29  Age: 54 y.o. MRN: [id number]  CC: Hypertension   HPI Christine Garcia presents for follow-up on her blood pressure but she is also considering filing for disability. She had surgery on her right hand for Dupuytren's contractions within the last year. She has had complications after the surgery. Her right fourth and fifth fingers are now contracted and she cannot use them. She has severe pain in her right hand and right upper extremity that her hand surgeon has told her is complex regional pain syndrome. She is currently working but has pain and difficulty using her right hand. It is my recommendation that she consider filing for disability. She also needs help with pain management today. She has not seen pain management but she is willing to do so. She states that she has a searing, throbbing pain in her right hand and right forearm that is present all the time.  Outpatient Prescriptions Prior to Visit  Medication Sig Dispense Refill  . aspirin 81 MG tablet Take 81 mg by mouth daily.      . carvedilol (COREG) 6.25 MG tablet TAKE ONE TABLET BY MOUTH TWICE DAILY WITH MEALS 60 tablet 11  . Cyanocobalamin (B-12 PO) Take 1 tablet by mouth daily.    . ferrous sulfate 325 (65 FE) MG tablet Take 325 mg by mouth daily.      Marland Kitchen KLOR-CON M20 20 MEQ tablet TAKE ONE TABLET BY MOUTH TWICE DAILY. **MUST KEEP 12/18/13 APPOINTMENT FOR FURTHER REFILLS** 180 tablet 6  . Lansoprazole (PREVACID 24HR PO) Take 1 capsule by mouth daily.      Marland Kitchen omega-3 acid ethyl esters (LOVAZA) 1 G capsule Take 2 capsules (2 g total) by mouth 2 (two) times daily. 180 capsule 3  . triamterene-hydrochlorothiazide (MAXZIDE-25) 37.5-25 MG per tablet TAKE ONE TABLET BY MOUTH ONCE DAILY 30 tablet 11  . HYDROcodone-acetaminophen (NORCO) 5-325 MG per tablet Take 1 tablet by mouth every 6 (six) hours as needed for moderate pain. 30 tablet 0   No facility-administered medications  prior to visit.    ROS Review of Systems  Constitutional: Negative.  Negative for fever, chills, diaphoresis, appetite change and fatigue.  HENT: Negative.   Eyes: Negative.   Respiratory: Negative.  Negative for cough, choking, chest tightness, shortness of breath and stridor.   Cardiovascular: Negative.  Negative for chest pain, palpitations and leg swelling.  Gastrointestinal: Negative.  Negative for nausea, vomiting, abdominal pain, diarrhea, constipation and blood in stool.  Endocrine: Negative.   Genitourinary: Negative.   Musculoskeletal: Positive for arthralgias. Negative for myalgias, back pain and neck pain.  Skin: Negative.   Allergic/Immunologic: Negative.   Neurological: Negative.  Negative for dizziness and light-headedness.  Hematological: Negative.  Negative for adenopathy.  Psychiatric/Behavioral: Negative.     Objective:  BP 118/82 mmHg  Pulse 79  Temp(Src) 98 F (36.7 C) (Oral)  Ht 5\' 7"  (1.702 m)  Wt 154 lb (69.854 kg)  BMI 24.11 kg/m2  SpO2 98%  LMP 09/15/2011  BP Readings from Last 3 Encounters:  12/30/14 118/82  09/02/14 167/82  03/01/14 110/68    Wt Readings from Last 3 Encounters:  12/30/14 154 lb (69.854 kg)  09/02/14 147 lb (66.679 kg)  03/01/14 144 lb (65.318 kg)    Physical Exam  Constitutional: She is oriented to person, place, and time. No distress.  HENT:  Mouth/Throat: Oropharynx is clear and moist. No oropharyngeal exudate.  Eyes: Conjunctivae are normal.  Right eye exhibits no discharge. Left eye exhibits no discharge. No scleral icterus.  Neck: Normal range of motion. Neck supple. No JVD present. No tracheal deviation present. No thyromegaly present.  Cardiovascular: Normal rate, regular rhythm, normal heart sounds and intact distal pulses.  Exam reveals no gallop and no friction rub.   No murmur heard. Pulmonary/Chest: Effort normal and breath sounds normal. No stridor. No respiratory distress. She has no wheezes. She has no  rales. She exhibits no tenderness.  Abdominal: Soft. Bowel sounds are normal. She exhibits no distension and no mass. There is no tenderness. There is no rebound and no guarding.  Musculoskeletal: Normal range of motion. She exhibits no edema or tenderness.  She has a static 90 flexion in her right fourth and fifth fingers at the PIP and MCP joints.  Lymphadenopathy:    She has no cervical adenopathy.  Neurological: She is oriented to person, place, and time.  Skin: Skin is warm and dry. No rash noted. She is not diaphoretic. No erythema. No pallor.  Psychiatric: She has a normal mood and affect. Her behavior is normal. Judgment and thought content normal.  Vitals reviewed.   Lab Results  Component Value Date   WBC 10.4 12/30/2014   HGB 14.1 12/30/2014   HCT 41.4 12/30/2014   PLT 326.0 12/30/2014   GLUCOSE 92 12/30/2014   CHOL 262* 12/30/2014   TRIG 143.0 12/30/2014   HDL 69.20 12/30/2014   LDLDIRECT 85.5 02/01/2014   LDLCALC 164* 12/30/2014   ALT 15 12/30/2014   AST 21 12/30/2014   NA 137 12/30/2014   K 4.7 12/30/2014   CL 102 12/30/2014   CREATININE 1.14 12/30/2014   BUN 17 12/30/2014   CO2 27 12/30/2014   TSH 1.51 12/30/2014   INR 1.0 07/13/2010    No results found.  Assessment & Plan:   Christine Garcia was seen today for hypertension.  Diagnoses and all orders for this visit:  Visit for screening mammogram -     MM DIGITAL SCREENING BILATERAL; Future  Atherosclerosis of native coronary artery of native heart without angina pectoris -     Lipid panel; Future  Essential hypertension- her blood pressure is well-controlled, her electrolytes and renal function are stable. -     Lipid panel; Future -     CBC with Differential/Platelet; Future  Elevated liver enzymes- this was previously related to her alcohol intake. She tells me that she is now abstaining from alcohol and her liver enzymes are normal. We'll continue to follow this. -     Comprehensive metabolic panel;  Future  Hyperlipidemia with target LDL less than 100- her Framingham risk score is only 1% so I have not recommended that she start a statin -     Comprehensive metabolic panel; Future -     TSH; Future  Complex regional pain syndrome of right upper extremity- she will take tramadol for the pain and agrees to be seen at a pain clinic. -     Ambulatory referral to Pain Clinic -     traMADol (ULTRAM) 50 MG tablet; Take 1 tablet (50 mg total) by mouth every 6 (six) hours as needed.   I have discontinued Christine Garcia's HYDROcodone-acetaminophen. I have also changed her traMADol. Additionally, I am having her maintain her aspirin, ferrous sulfate, Lansoprazole (PREVACID 24HR PO), Cyanocobalamin (B-12 PO), triamterene-hydrochlorothiazide, KLOR-CON M20, carvedilol, and omega-3 acid ethyl esters.  Meds ordered this encounter  Medications  . DISCONTD: traMADol (ULTRAM) 50 MG tablet  Sig:   . traMADol (ULTRAM) 50 MG tablet    Sig: Take 1 tablet (50 mg total) by mouth every 6 (six) hours as needed.    Dispense:  120 tablet    Refill:  3     Follow-up: Return in about 4 months (around 05/01/2015).  Sanda Linger, MD

## 2014-12-30 NOTE — Telephone Encounter (Signed)
Spoke with pt during OV regarding mammogram per [facility] reminder. Pt declines mammogram at this time.

## 2014-12-30 NOTE — Telephone Encounter (Signed)
Pt was in today and was wanting to know if Dr. Yetta Barre can write up a statement to disability about her hand

## 2014-12-30 NOTE — Patient Instructions (Signed)

## 2014-12-30 NOTE — Progress Notes (Signed)
Pre visit review using our clinic review tool, if applicable. No additional management support is needed unless otherwise documented below in the visit note. 

## 2015-01-02 ENCOUNTER — Encounter: Payer: Self-pay | Admitting: Internal Medicine

## 2015-01-02 ENCOUNTER — Emergency Department (HOSPITAL_COMMUNITY)
Admission: EM | Admit: 2015-01-02 | Discharge: 2015-01-02 | Disposition: A | Discharge status: Home / Self Care | Payer: 59 | Attending: Emergency Medicine | Admitting: Emergency Medicine

## 2015-01-02 ENCOUNTER — Encounter (HOSPITAL_COMMUNITY): Payer: Self-pay | Admitting: *Deleted

## 2015-01-02 ENCOUNTER — Emergency Department (HOSPITAL_COMMUNITY): Payer: 59

## 2015-01-02 DIAGNOSIS — Z8739 Personal history of other diseases of the musculoskeletal system and connective tissue: Secondary | ICD-10-CM | POA: Insufficient documentation

## 2015-01-02 DIAGNOSIS — S42212A Unspecified displaced fracture of surgical neck of left humerus, initial encounter for closed fracture: Secondary | ICD-10-CM

## 2015-01-02 DIAGNOSIS — K219 Gastro-esophageal reflux disease without esophagitis: Secondary | ICD-10-CM | POA: Insufficient documentation

## 2015-01-02 DIAGNOSIS — Z880 Allergy status to penicillin: Secondary | ICD-10-CM | POA: Insufficient documentation

## 2015-01-02 DIAGNOSIS — Z7982 Long term (current) use of aspirin: Secondary | ICD-10-CM | POA: Insufficient documentation

## 2015-01-02 DIAGNOSIS — Y939 Activity, unspecified: Secondary | ICD-10-CM | POA: Insufficient documentation

## 2015-01-02 DIAGNOSIS — R011 Cardiac murmur, unspecified: Secondary | ICD-10-CM | POA: Insufficient documentation

## 2015-01-02 DIAGNOSIS — I4891 Unspecified atrial fibrillation: Secondary | ICD-10-CM | POA: Insufficient documentation

## 2015-01-02 DIAGNOSIS — I10 Essential (primary) hypertension: Secondary | ICD-10-CM | POA: Insufficient documentation

## 2015-01-02 DIAGNOSIS — Y929 Unspecified place or not applicable: Secondary | ICD-10-CM | POA: Insufficient documentation

## 2015-01-02 DIAGNOSIS — Z79899 Other long term (current) drug therapy: Secondary | ICD-10-CM | POA: Insufficient documentation

## 2015-01-02 DIAGNOSIS — Z8639 Personal history of other endocrine, nutritional and metabolic disease: Secondary | ICD-10-CM | POA: Insufficient documentation

## 2015-01-02 DIAGNOSIS — Y998 Other external cause status: Secondary | ICD-10-CM | POA: Insufficient documentation

## 2015-01-02 DIAGNOSIS — W1839XA Other fall on same level, initial encounter: Secondary | ICD-10-CM | POA: Insufficient documentation

## 2015-01-02 MED ORDER — OXYCODONE-ACETAMINOPHEN 5-325 MG PO TABS: 1.0000 | ORAL_TABLET | Freq: Four times a day (QID) | ORAL | Status: DC | PRN

## 2015-01-02 MED ORDER — OXYCODONE-ACETAMINOPHEN 5-325 MG PO TABS
1.0000 | ORAL_TABLET | Freq: Once | ORAL | Status: AC
  Administered 2015-01-02: 1 via ORAL
  Filled 2015-01-02: qty 1

## 2015-01-02 NOTE — Progress Notes (Signed)
Orthopedic Tech Progress Note Patient Details:  Christine Garcia 1960/10/08 [id number]  Ortho Devices Type of Ortho Device: Shoulder immobilizer Ortho Device/Splint Location: LUE Ortho Device/Splint Interventions: Ordered, Application   Jennye Moccasin 01/02/2015, 10:48 PM

## 2015-01-02 NOTE — ED Notes (Signed)
Pt. Left with all belongings and refused wheelchair. Discharge instructions were reviewed and all questions were answered.  

## 2015-01-02 NOTE — Telephone Encounter (Signed)
Letter written, it is been mailed to her.

## 2015-01-02 NOTE — ED Notes (Signed)
Spoke with patient, ref wait time. Informed her that I was moving her back next.
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
## 2015-01-10 NOTE — Pre-Procedure Instructions (Signed)
    Christine Garcia  01/10/2015      [facility] - [place], [place] - [address] [place] [address] Phone: [phone] Fax: [phone]    Your procedure is scheduled on 01/13/15.  Report to [facility] Admitting at 8 A.M.  Call this number if you have problems the morning of surgery:  [phone]   Remember:  Do not eat food or drink liquids after midnight.  Take these medicines the morning of surgery with A SIP OF WATER --carvedilol,oxycodone,prevacid   Do not wear jewelry, make-up or nail polish.  Do not wear lotions, powders, or perfumes.  You may wear deodorant.  Do not shave 48 hours prior to surgery.  Men may shave face and neck.  Do not bring valuables to the hospital.  [facility] is not responsible for any belongings or valuables.  Contacts, dentures or bridgework may not be worn into surgery.  Leave your suitcase in the car.  After surgery it may be brought to your room.  For patients admitted to the hospital, discharge time will be determined by your treatment team.  Patients discharged the day of surgery will not be allowed to drive home.   Name and phone number of your driver:    Special instructions:    Please read over the following fact sheets that you were given. Pain Booklet, Coughing and Deep Breathing and Surgical Site Infection Prevention

## 2015-01-11 ENCOUNTER — Encounter (HOSPITAL_COMMUNITY)
Admission: RE | Admit: 2015-01-11 | Discharge: 2015-01-11 | Disposition: A | Discharge status: Home / Self Care | Payer: 59 | Source: Ambulatory Visit | Attending: Orthopedic Surgery | Admitting: Orthopedic Surgery

## 2015-01-11 ENCOUNTER — Encounter (HOSPITAL_COMMUNITY): Payer: Self-pay

## 2015-01-11 DIAGNOSIS — X58XXXA Exposure to other specified factors, initial encounter: Secondary | ICD-10-CM | POA: Insufficient documentation

## 2015-01-11 DIAGNOSIS — Z01812 Encounter for preprocedural laboratory examination: Secondary | ICD-10-CM | POA: Insufficient documentation

## 2015-01-11 DIAGNOSIS — S42202A Unspecified fracture of upper end of left humerus, initial encounter for closed fracture: Secondary | ICD-10-CM | POA: Insufficient documentation

## 2015-01-11 DIAGNOSIS — I10 Essential (primary) hypertension: Secondary | ICD-10-CM | POA: Insufficient documentation

## 2015-01-11 DIAGNOSIS — R9431 Abnormal electrocardiogram [ECG] [EKG]: Secondary | ICD-10-CM | POA: Insufficient documentation

## 2015-01-11 HISTORY — DX: Anxiety disorder, unspecified: F41.9

## 2015-01-11 LAB — COMPREHENSIVE METABOLIC PANEL
ALT: 25 U/L (ref 14–54)
ANION GAP: 9 (ref 5–15)
AST: 32 U/L (ref 15–41)
Albumin: 3.3 g/dL — ABNORMAL LOW (ref 3.5–5.0)
Alkaline Phosphatase: 148 U/L — ABNORMAL HIGH (ref 38–126)
BUN: 10 mg/dL (ref 6–20)
CALCIUM: 9.2 mg/dL (ref 8.9–10.3)
CHLORIDE: 101 mmol/L (ref 101–111)
CO2: 24 mmol/L (ref 22–32)
Creatinine, Ser: 0.65 mg/dL (ref 0.44–1.00)
GFR calc non Af Amer: >60 mL/min (ref >60)
Glucose, Bld: 92 mg/dL (ref 65–99)
Potassium: 4.8 mmol/L (ref 3.5–5.1)
SODIUM: 134 mmol/L — AB (ref 135–145)
Total Bilirubin: 0.5 mg/dL (ref 0.3–1.2)
Total Protein: 6.5 g/dL (ref 6.5–8.1)

## 2015-01-11 LAB — CBC WITH DIFFERENTIAL/PLATELET
Basophils Absolute: 0 10*3/uL (ref 0.0–0.1)
Basophils Relative: 1 % (ref 0–1)
EOS ABS: 0.4 10*3/uL (ref 0.0–0.7)
EOS PCT: 5 % (ref 0–5)
HCT: 38.9 % (ref 36.0–46.0)
Hemoglobin: 13.1 g/dL (ref 12.0–15.0)
LYMPHS ABS: 1.3 10*3/uL (ref 0.7–4.0)
Lymphocytes Relative: 16 % (ref 12–46)
MCH: 32.6 pg (ref 26.0–34.0)
MCHC: 33.7 g/dL (ref 30.0–36.0)
MCV: 96.8 fL (ref 78.0–100.0)
MONOS PCT: 10 % (ref 3–12)
Monocytes Absolute: 0.7 10*3/uL (ref 0.1–1.0)
Neutro Abs: 5.3 10*3/uL (ref 1.7–7.7)
Neutrophils Relative %: 68 % (ref 43–77)
PLATELETS: 344 10*3/uL (ref 150–400)
RBC: 4.02 MIL/uL (ref 3.87–5.11)
RDW: 12.4 % (ref 11.5–15.5)
WBC: 7.7 10*3/uL (ref 4.0–10.5)

## 2015-01-11 LAB — APTT: aPTT: 27 seconds (ref 24–37)

## 2015-01-11 LAB — PROTIME-INR
INR: 0.98 (ref 0.00–1.49)
Prothrombin Time: 13.2 seconds (ref 11.6–15.2)

## 2015-01-12 MED ORDER — CHLORHEXIDINE GLUCONATE 4 % EX LIQD: 60.0000 mL | Freq: Once | CUTANEOUS | Status: DC

## 2015-01-12 MED ORDER — LACTATED RINGERS IV SOLN
INTRAVENOUS | Status: DC
  Administered 2015-01-13 (×2): via INTRAVENOUS

## 2015-01-13 ENCOUNTER — Encounter (HOSPITAL_COMMUNITY)
Admission: RE | Disposition: A | Discharge status: Home / Self Care | Payer: Self-pay | Source: Ambulatory Visit | Attending: Orthopedic Surgery

## 2015-01-13 ENCOUNTER — Ambulatory Visit (HOSPITAL_COMMUNITY): Payer: Self-pay

## 2015-01-13 ENCOUNTER — Ambulatory Visit (HOSPITAL_COMMUNITY): Payer: MEDICAID | Admitting: Certified Registered Nurse Anesthetist

## 2015-01-13 ENCOUNTER — Ambulatory Visit (HOSPITAL_COMMUNITY): Payer: Self-pay | Admitting: Certified Registered Nurse Anesthetist

## 2015-01-13 ENCOUNTER — Ambulatory Visit (HOSPITAL_COMMUNITY)
Admission: RE | Admit: 2015-01-13 | Discharge: 2015-01-13 | Disposition: A | Discharge status: Home / Self Care | Payer: 59 | Source: Ambulatory Visit | Attending: Orthopedic Surgery | Admitting: Orthopedic Surgery

## 2015-01-13 ENCOUNTER — Encounter (HOSPITAL_COMMUNITY): Payer: Self-pay | Admitting: General Practice

## 2015-01-13 DIAGNOSIS — Z419 Encounter for procedure for purposes other than remedying health state, unspecified: Secondary | ICD-10-CM

## 2015-01-13 DIAGNOSIS — Z7982 Long term (current) use of aspirin: Secondary | ICD-10-CM | POA: Insufficient documentation

## 2015-01-13 DIAGNOSIS — I2510 Atherosclerotic heart disease of native coronary artery without angina pectoris: Secondary | ICD-10-CM | POA: Insufficient documentation

## 2015-01-13 DIAGNOSIS — S42302A Unspecified fracture of shaft of humerus, left arm, initial encounter for closed fracture: Secondary | ICD-10-CM | POA: Insufficient documentation

## 2015-01-13 DIAGNOSIS — W19XXXA Unspecified fall, initial encounter: Secondary | ICD-10-CM | POA: Insufficient documentation

## 2015-01-13 DIAGNOSIS — I10 Essential (primary) hypertension: Secondary | ICD-10-CM | POA: Insufficient documentation

## 2015-01-13 DIAGNOSIS — I252 Old myocardial infarction: Secondary | ICD-10-CM | POA: Insufficient documentation

## 2015-01-13 DIAGNOSIS — Z880 Allergy status to penicillin: Secondary | ICD-10-CM | POA: Insufficient documentation

## 2015-01-13 DIAGNOSIS — I509 Heart failure, unspecified: Secondary | ICD-10-CM | POA: Insufficient documentation

## 2015-01-13 HISTORY — PX: ORIF HUMERUS FRACTURE: SHX2126

## 2015-01-13 SURGERY — OPEN REDUCTION INTERNAL FIXATION (ORIF) PROXIMAL HUMERUS FRACTURE
Anesthesia: Regional | Site: Arm Upper | Laterality: Left

## 2015-01-13 MED ORDER — OXYCODONE HCL 5 MG PO TABS
5.0000 mg | ORAL_TABLET | Freq: Once | ORAL | Status: AC | PRN
  Administered 2015-01-13: 5 mg via ORAL

## 2015-01-13 MED ORDER — PROPOFOL 10 MG/ML IV BOLUS
INTRAVENOUS | Status: DC | PRN
  Administered 2015-01-13: 130 mg via INTRAVENOUS

## 2015-01-13 MED ORDER — FENTANYL CITRATE (PF) 100 MCG/2ML IJ SOLN: 25.0000 ug | INTRAMUSCULAR | Status: DC | PRN

## 2015-01-13 MED ORDER — OXYCODONE HCL 5 MG/5ML PO SOLN: 5.0000 mg | Freq: Once | ORAL | Status: AC | PRN

## 2015-01-13 MED ORDER — DEXAMETHASONE SODIUM PHOSPHATE 4 MG/ML IJ SOLN
INTRAMUSCULAR | Status: AC
  Filled 2015-01-13: qty 1

## 2015-01-13 MED ORDER — FENTANYL CITRATE (PF) 100 MCG/2ML IJ SOLN
100.0000 ug | INTRAMUSCULAR | Status: DC | PRN
  Administered 2015-01-13: 100 ug via INTRAVENOUS

## 2015-01-13 MED ORDER — MIDAZOLAM HCL 2 MG/2ML IJ SOLN
INTRAMUSCULAR | Status: AC
  Filled 2015-01-13: qty 4

## 2015-01-13 MED ORDER — 0.9 % SODIUM CHLORIDE (POUR BTL) OPTIME
TOPICAL | Status: DC | PRN
  Administered 2015-01-13: 1000 mL

## 2015-01-13 MED ORDER — PROPOFOL 10 MG/ML IV BOLUS
INTRAVENOUS | Status: AC
  Filled 2015-01-13: qty 20

## 2015-01-13 MED ORDER — OXYCODONE-ACETAMINOPHEN 5-325 MG PO TABS: 1.0000 | ORAL_TABLET | Freq: Four times a day (QID) | ORAL | Status: DC | PRN

## 2015-01-13 MED ORDER — ROPIVACAINE HCL 5 MG/ML IJ SOLN
INTRAMUSCULAR | Status: DC | PRN
  Administered 2015-01-13: 20 mL via PERINEURAL

## 2015-01-13 MED ORDER — FENTANYL CITRATE (PF) 100 MCG/2ML IJ SOLN
INTRAMUSCULAR | Status: AC
  Administered 2015-01-13: 150 ug via INTRAVENOUS
  Filled 2015-01-13: qty 2

## 2015-01-13 MED ORDER — NEOSTIGMINE METHYLSULFATE 10 MG/10ML IV SOLN
INTRAVENOUS | Status: DC | PRN
  Administered 2015-01-13: 5 mg via INTRAVENOUS

## 2015-01-13 MED ORDER — ACETAMINOPHEN 325 MG PO TABS
325.0000 mg | ORAL_TABLET | ORAL | Status: DC | PRN
  Administered 2015-01-13: 650 mg via ORAL

## 2015-01-13 MED ORDER — LIDOCAINE HCL (CARDIAC) 20 MG/ML IV SOLN
INTRAVENOUS | Status: DC | PRN
  Administered 2015-01-13: 60 mg via INTRAVENOUS

## 2015-01-13 MED ORDER — BUPIVACAINE-EPINEPHRINE (PF) 0.5% -1:200000 IJ SOLN
INTRAMUSCULAR | Status: AC
  Filled 2015-01-13: qty 30

## 2015-01-13 MED ORDER — CEFAZOLIN SODIUM-DEXTROSE 2-3 GM-% IV SOLR
INTRAVENOUS | Status: AC
  Administered 2015-01-13: 2 g via INTRAVENOUS
  Filled 2015-01-13: qty 50

## 2015-01-13 MED ORDER — ONDANSETRON HCL 4 MG/2ML IJ SOLN
INTRAMUSCULAR | Status: DC | PRN
  Administered 2015-01-13: 4 mg via INTRAVENOUS

## 2015-01-13 MED ORDER — DIAZEPAM 5 MG PO TABS: 5.0000 mg | ORAL_TABLET | Freq: Four times a day (QID) | ORAL | Status: DC | PRN

## 2015-01-13 MED ORDER — PHENYLEPHRINE HCL 10 MG/ML IJ SOLN
INTRAMUSCULAR | Status: DC | PRN
  Administered 2015-01-13: 40 ug via INTRAVENOUS
  Administered 2015-01-13: 80 ug via INTRAVENOUS
  Administered 2015-01-13 (×3): 40 ug via INTRAVENOUS

## 2015-01-13 MED ORDER — MIDAZOLAM HCL 2 MG/2ML IJ SOLN
2.0000 mg | INTRAMUSCULAR | Status: DC | PRN
  Administered 2015-01-13: 2 mg via INTRAVENOUS

## 2015-01-13 MED ORDER — ACETAMINOPHEN 160 MG/5ML PO SOLN
325.0000 mg | ORAL | Status: DC | PRN
  Filled 2015-01-13: qty 20.3

## 2015-01-13 MED ORDER — ACETAMINOPHEN 325 MG PO TABS
ORAL_TABLET | ORAL | Status: AC
  Filled 2015-01-13: qty 2

## 2015-01-13 MED ORDER — ROCURONIUM BROMIDE 100 MG/10ML IV SOLN
INTRAVENOUS | Status: DC | PRN
  Administered 2015-01-13: 10 mg via INTRAVENOUS
  Administered 2015-01-13: 40 mg via INTRAVENOUS

## 2015-01-13 MED ORDER — MIDAZOLAM HCL 2 MG/2ML IJ SOLN
INTRAMUSCULAR | Status: AC
  Filled 2015-01-13: qty 2

## 2015-01-13 MED ORDER — HYDROMORPHONE HCL 2 MG PO TABS: 2.0000 mg | ORAL_TABLET | ORAL | Status: DC | PRN

## 2015-01-13 MED ORDER — FENTANYL CITRATE (PF) 250 MCG/5ML IJ SOLN
INTRAMUSCULAR | Status: AC
  Filled 2015-01-13: qty 5

## 2015-01-13 MED ORDER — GLYCOPYRROLATE 0.2 MG/ML IJ SOLN
INTRAMUSCULAR | Status: DC | PRN
  Administered 2015-01-13: 0.6 mg via INTRAVENOUS

## 2015-01-13 MED ORDER — OXYCODONE HCL 5 MG PO TABS
ORAL_TABLET | ORAL | Status: AC
  Filled 2015-01-13: qty 1

## 2015-01-13 MED ORDER — PHENYLEPHRINE HCL 10 MG/ML IJ SOLN
10.0000 mg | INTRAVENOUS | Status: DC | PRN
  Administered 2015-01-13: 10 ug/min via INTRAVENOUS

## 2015-01-13 SURGICAL SUPPLY — 55 items
BIT DRILL 3.2 (BIT) ×1
BIT DRILL 3.2XCALB NS DISP (BIT) ×1 IMPLANT
BIT DRILL CALIBRATED 2.7 (BIT) ×2 IMPLANT
BIT DRL 3.2XCALB NS DISP (BIT) ×1
COVER SURGICAL LIGHT HANDLE (MISCELLANEOUS) ×2 IMPLANT
DRAPE C-ARM 42X72 X-RAY (DRAPES) ×2 IMPLANT
DRAPE IMP U-DRAPE 54X76 (DRAPES) ×2 IMPLANT
DRAPE INCISE IOBAN 66X45 STRL (DRAPES) ×2 IMPLANT
DRAPE ORTHO SPLIT 77X108 STRL (DRAPES) ×2
DRAPE SURG ORHT 6 SPLT 77X108 (DRAPES) ×2 IMPLANT
DRAPE U-SHAPE 47X51 STRL (DRAPES) ×2 IMPLANT
DRSG AQUACEL AG ADV 3.5X10 (GAUZE/BANDAGES/DRESSINGS) ×4 IMPLANT
DRSG MEPILEX BORDER 4X8 (GAUZE/BANDAGES/DRESSINGS) ×2 IMPLANT
DURAPREP 26ML APPLICATOR (WOUND CARE) ×2 IMPLANT
ELECT REM PT RETURN 9FT ADLT (ELECTROSURGICAL) ×2
ELECTRODE REM PT RTRN 9FT ADLT (ELECTROSURGICAL) ×1 IMPLANT
GLOVE BIO SURGEON STRL SZ7.5 (GLOVE) ×2 IMPLANT
GLOVE BIO SURGEON STRL SZ8 (GLOVE) ×2 IMPLANT
GLOVE EUDERMIC 7 POWDERFREE (GLOVE) ×4 IMPLANT
GLOVE SS BIOGEL STRL SZ 7.5 (GLOVE) ×2 IMPLANT
GLOVE SUPERSENSE BIOGEL SZ 7.5 (GLOVE) ×2
GOWN STRL REUS W/ TWL LRG LVL3 (GOWN DISPOSABLE) ×1 IMPLANT
GOWN STRL REUS W/ TWL XL LVL3 (GOWN DISPOSABLE) ×2 IMPLANT
GOWN STRL REUS W/TWL LRG LVL3 (GOWN DISPOSABLE) ×1
GOWN STRL REUS W/TWL XL LVL3 (GOWN DISPOSABLE) ×2
K-WIRE 2X5 SS THRDED S3 (WIRE) ×2
KIT BASIN OR (CUSTOM PROCEDURE TRAY) ×2 IMPLANT
KIT ROOM TURNOVER OR (KITS) ×4 IMPLANT
KWIRE 2X5 SS THRDED S3 (WIRE) ×1 IMPLANT
MANIFOLD NEPTUNE II (INSTRUMENTS) ×2 IMPLANT
NEEDLE 22X1 1/2 (OR ONLY) (NEEDLE) ×2 IMPLANT
NS IRRIG 1000ML POUR BTL (IV SOLUTION) ×2 IMPLANT
PACK SHOULDER (CUSTOM PROCEDURE TRAY) ×2 IMPLANT
PAD ARMBOARD 7.5X6 YLW CONV (MISCELLANEOUS) ×4 IMPLANT
PEG LOCKING 3.2MMX26MM (Peg) ×2 IMPLANT
PEG LOCKING 3.2X34 (Screw) ×2 IMPLANT
PEG LOCKING 3.2X40 (Peg) ×4 IMPLANT
PEG LOCKING 3.2X42 (Screw) ×4 IMPLANT
PLATE PROX HUM HI L 3H 80 (Plate) ×2 IMPLANT
SCREW LP NL T15 3.5X26 (Screw) ×6 IMPLANT
SCREW PEG LOCK 3.2X30MM (Screw) ×4 IMPLANT
SLEEVE MEASURING 3.2 (BIT) ×2 IMPLANT
SLING ARM FOAM STRAP LRG (SOFTGOODS) ×2 IMPLANT
SLING ULTRA II LARGE (SOFTGOODS) ×2 IMPLANT
SPONGE LAP 18X18 X RAY DECT (DISPOSABLE) ×2 IMPLANT
SUCTION FRAZIER TIP 10 FR DISP (SUCTIONS) ×2 IMPLANT
SUT MNCRL AB 3-0 PS2 18 (SUTURE) ×2 IMPLANT
SUT VIC AB 1 CT1 27 (SUTURE) ×1
SUT VIC AB 1 CT1 27XBRD ANTBC (SUTURE) ×1 IMPLANT
SUT VIC AB 2-0 CT1 27 (SUTURE) ×2
SUT VIC AB 2-0 CT1 TAPERPNT 27 (SUTURE) ×2 IMPLANT
SYR CONTROL 10ML LL (SYRINGE) ×2 IMPLANT
TOWEL OR 17X24 6PK STRL BLUE (TOWEL DISPOSABLE) ×2 IMPLANT
TOWEL OR 17X26 10 PK STRL BLUE (TOWEL DISPOSABLE) ×2 IMPLANT
WATER STERILE IRR 1000ML POUR (IV SOLUTION) ×2 IMPLANT
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
## 2015-01-13 NOTE — Discharge Instructions (Signed)
° °  Vania Rea. Supple, M.D., F.A.A.O.S. [facility] Specializing in Arthroscopic and Reconstructive Surgery of the Shoulder and Knee [phone] [address] [place], [place] [address] - Fax [phone]   POST-OP  SHOULDER   1. Call the office at [phone] to schedule your first post-op appointment 10-14 days from the date of your surgery.  2. The bandage over your incision is waterproof. You may begin showering with this dressing on. You may leave this dressing on until first follow up appointment within 2 weeks. We prefer you leave this dressing in place until follow up however after 5-7 days if you are having itching or skin irritation and would like to remove it you may do so. Go slow and tug at the borders gently to break the bond the dressing has with the skin. At this point if there is no drainage it is okay to go without a bandage or you may cover it with a light guaze and tape. You can also expect significant bruising around your shoulder that will drift down your arm and into your chest wall. This is very normal and should resolve over several days.   3. Wear your sling/immobilizer at all times except to perform the exercises below or to occasionally let your arm dangle by your side to stretch your elbow. You also need to sleep in your sling immobilizer until instructed otherwise.  4. Range of motion to your elbow, wrist, and hand are encouraged 3-5 times daily. Exercise to your hand and fingers helps to reduce swelling you may experience.  5. Utilize ice to the shoulder 3-5 times minimum a day and additionally if you are experiencing pain.  6. Prescriptions for a pain medication and a muscle relaxant are provided for you. It is recommended that if you are experiencing pain that you pain medication alone is not controlling, add the muscle relaxant along with the pain medication which can give additional pain relief. The first 1-2 days is generally the most severe  of your pain and then should gradually decrease. As your pain lessens it is recommended that you decrease your use of the pain medications to an "as needed basis'" only and to always comply with the recommended dosages of the pain medications.  7. Pain medications can produce constipation along with their use. If you experience this, the use of an over the counter stool softener or laxative daily is recommended.   8. For most patients, if insurance allows, home health services to include therapy has been arranged.  9. For additional questions or concerns, please do not hesitate to call the office. If after hours there is an answering service to forward your concerns to the physician on call.  10. You may allow arm to dangle by side to stretch elbow and for hygiene.

## 2015-01-13 NOTE — H&P (Signed)
Christine Garcia    Chief Complaint: LEFT PROXIMAL HUMERUS FRACTURE  HPI: The patient is a 54 y.o. female s/p fall with displaced left proximal humerus fracture  Past Medical History  Diagnosis Date  . GERD (gastroesophageal reflux disease)   . Migraines     occasional  . Seasonal allergies   . History of atrial fibrillation     one occurrence - states K+ was low at the time  . Murmur     states no problems  . Hypertension     under control with med., has been on med. x 2 yr.  . High triglycerides   . Dupuytren's contracture of right hand 08/2014    ring/middle fingers  . Complication of anesthesia     states "is hard to numb"  . Anxiety     Past Surgical History  Procedure Laterality Date  . Dilation and curettage of uterus  2002  . Cardiac catheterization  07/14/2010    "nonobstructive CAD"  . Fasciectomy Right 09/02/2014    Procedure: RIGHT HAND FASCIECTOMY RIGHT RING AND RIGHT MIDDLE FINGER ;  Surgeon: Cindee Salt, MD;  Location: [facility];  Service: Orthopedics;  Laterality: Right;    Family History  Problem Relation Age of Onset  . Heart attack Sister 43  . Stroke Mother   . Congestive Heart Failure Father   . Heart attack Mother     Social History:  reports that she has never smoked. She has never used smokeless tobacco. She reports that she drinks alcohol. She reports that she does not use illicit drugs.  Allergies:  Allergies  Allergen Reactions  . Penicillins Hives    Medications Prior to Admission  Medication Sig Dispense Refill  . aspirin 81 MG tablet Take 81 mg by mouth daily.      . carvedilol (COREG) 6.25 MG tablet TAKE ONE TABLET BY MOUTH TWICE DAILY WITH MEALS 60 tablet 11  . Cyanocobalamin (B-12 PO) Take 1 tablet by mouth daily.    . diazepam (VALIUM) 5 MG tablet Take 5 mg by mouth every 6 (six) hours as needed for anxiety.    . ferrous sulfate 325 (65 FE) MG tablet Take 325 mg by mouth daily.      Marland Kitchen ibuprofen (ADVIL,MOTRIN) 200  MG tablet Take 400 mg by mouth every 6 (six) hours as needed for moderate pain.    Marland Kitchen KLOR-CON M20 20 MEQ tablet TAKE ONE TABLET BY MOUTH TWICE DAILY. **MUST KEEP 12/18/13 APPOINTMENT FOR FURTHER REFILLS** 180 tablet 6  . Lansoprazole (PREVACID 24HR PO) Take 1 capsule by mouth daily.      Marland Kitchen omega-3 acid ethyl esters (LOVAZA) 1 G capsule Take 2 capsules (2 g total) by mouth 2 (two) times daily. (Patient taking differently: Take 1 g by mouth daily. ) 180 capsule 3  . oxyCODONE-acetaminophen (PERCOCET/ROXICET) 5-325 MG per tablet Take 1-2 tablets by mouth every 6 (six) hours as needed for severe pain. 25 tablet 0  . traMADol (ULTRAM) 50 MG tablet Take 1 tablet (50 mg total) by mouth every 6 (six) hours as needed. 120 tablet 3  . triamterene-hydrochlorothiazide (MAXZIDE-25) 37.5-25 MG per tablet TAKE ONE TABLET BY MOUTH ONCE DAILY 30 tablet 11     Physical Exam: left shoulder with painful and restricted motion, grossly N/V intact  Vitals  Temp:  [97.1 F (36.2 C)] 97.1 F (36.2 C) (09/01 0827) Pulse Rate:  [80] 80 (09/01 0827) Resp:  [20] 20 (09/01 0827) BP: (118)/(67) 118/67 mmHg (  09/01 0827) SpO2:  [98 %] 98 % (09/01 0827) Weight:  [70.761 kg (156 lb)] 70.761 kg (156 lb) (09/01 0827)  Assessment/Plan  Impression: DISPLACED LEFT PROXIMAL HUMERUS FRACTURE   Plan of Action: Procedure(s): OPEN REDUCTION INTERNAL FIXATION (ORIF) LEFT PROXIMAL HUMERUS FRACTURE  Jonovan Boedecker M Mattalyn Anderegg 01/13/2015, 9:33 AM Contact # [phone]

## 2015-01-13 NOTE — Op Note (Signed)
01/13/2015  12:03 PM  PATIENT:   Christine Garcia  54 y.o. female  PRE-OPERATIVE DIAGNOSIS:  DISPLACED LEFT PROXIMAL HUMERUS FRACTURE   POST-OPERATIVE DIAGNOSIS:  SAME  PROCEDURE:  ORIF  SURGEON:  Charlei Ramsaran, Vania Rea. M.D.  ASSISTANTS: Shuford pac   ANESTHESIA:   GET + ISB  EBL: 150  SPECIMEN:  none  Drains: none   PATIENT DISPOSITION:  PACU - hemodynamically stable.    PLAN OF CARE: Discharge to home after PACU  Dictation# [id number]   Contact # [phone]

## 2015-01-13 NOTE — Transfer of Care (Signed)
Immediate Anesthesia Transfer of Care Note  Patient: Christine Garcia  Procedure(s) Performed: Procedure(s): OPEN REDUCTION INTERNAL FIXATION (ORIF) LEFT PROXIMAL HUMERUS FRACTURE (Left)  Patient Location: PACU  Anesthesia Type:GA combined with regional for post-op pain  Level of Consciousness: awake, alert , oriented, patient cooperative and responds to stimulation  Airway & Oxygen Therapy: Patient Spontanous Breathing and Patient connected to nasal cannula oxygen  Post-op Assessment: Report given to RN, Post -op Vital signs reviewed and stable, Patient moving all extremities X 4 and Patient able to stick tongue midline  Post vital signs: stable  Last Vitals:  Filed Vitals:   01/13/15 1000  BP:   Pulse: 73  Temp:   Resp: 23    Complications: No apparent anesthesia complications

## 2015-01-13 NOTE — Anesthesia Preprocedure Evaluation (Signed)
Anesthesia Evaluation  Patient identified by MRN, date of birth, ID band Patient awake    Reviewed: Allergy & Precautions, NPO status , Patient's Chart, lab work & pertinent test results  History of Anesthesia Complications Negative for: history of anesthetic complications  Airway Mallampati: II  TM Distance: >3 FB Neck ROM: Full    Dental  (+) Teeth Intact,    Pulmonary neg pulmonary ROS,  breath sounds clear to auscultation        Cardiovascular hypertension, Pt. on medications and Pt. on home beta blockers - angina+ CAD - Past MI, - CHF and - DOE Rhythm:Regular     Neuro/Psych  Headaches, PSYCHIATRIC DISORDERS Anxiety  Neuromuscular disease    GI/Hepatic Neg liver ROS, GERD-  Medicated and Controlled,  Endo/Other  negative endocrine ROS  Renal/GU negative Renal ROS     Musculoskeletal   Abdominal   Peds  Hematology negative hematology ROS (+)   Anesthesia Other Findings   Reproductive/Obstetrics                             Anesthesia Physical Anesthesia Plan  ASA: II  Anesthesia Plan: General and Regional   Post-op Pain Management:    Induction: Intravenous  Airway Management Planned: Oral ETT  Additional Equipment: None  Intra-op Plan:   Post-operative Plan: Extubation in OR  Informed Consent: I have reviewed the patients History and Physical, chart, labs and discussed the procedure including the risks, benefits and alternatives for the proposed anesthesia with the patient or authorized representative who has indicated his/her understanding and acceptance.   Dental advisory given  Plan Discussed with: CRNA and Surgeon  Anesthesia Plan Comments:         Anesthesia Quick Evaluation

## 2015-01-13 NOTE — Anesthesia Procedure Notes (Addendum)
Anesthesia Regional Block:  Interscalene brachial plexus block  Pre-Anesthetic Checklist: ,, timeout performed, Correct Patient, Correct Site, Correct Laterality, Correct Procedure, Correct Position, site marked, Risks and benefits discussed,  Surgical consent,  Pre-op evaluation,  At surgeon's request and post-op pain management  Laterality: Upper and Left  Prep: chloraprep       Needles:  Injection technique: Single-shot  Needle Type: Echogenic Stimulator Needle          Additional Needles:  Procedures: ultrasound guided (picture in chart) and nerve stimulator Interscalene brachial plexus block  Nerve Stimulator or Paresthesia:  Response: deltoid, 0.5 mA,   Additional Responses:   Narrative:  Injection made incrementally with aspirations every 5 mL.  Performed by: Personally  Anesthesiologist: Val Eagle  Additional Notes: H+P and labs reviewed, risks and benefits discussed with patient, procedure tolerated well without complications   Procedure Name: Intubation Date/Time: 01/13/2015 10:21 AM Performed by: Marylyn Ishihara Pre-anesthesia Checklist: Patient identified, Emergency Drugs available, Suction available, Patient being monitored and Timeout performed Patient Re-evaluated:Patient Re-evaluated prior to inductionOxygen Delivery Method: Circle system utilized Preoxygenation: Pre-oxygenation with 100% oxygen Ventilation: Mask ventilation without difficulty Laryngoscope Size: Mac and 3 Grade View: Grade I Tube type: Oral Tube size: 7.0 mm Number of attempts: 2 Placement Confirmation: ETT inserted through vocal cords under direct vision,  positive ETCO2 and breath sounds checked- equal and bilateral Secured at: 21 cm Tube secured with: Tape Dental Injury: Teeth and Oropharynx as per pre-operative assessment  Comments: First attempt intubation with 7.5 OET.  Successful attempt with 7.0 OET.

## 2015-01-14 ENCOUNTER — Encounter (HOSPITAL_COMMUNITY): Payer: Self-pay | Admitting: Orthopedic Surgery

## 2015-01-15 ENCOUNTER — Other Ambulatory Visit: Payer: Self-pay | Admitting: Cardiology

## 2015-01-18 NOTE — Anesthesia Postprocedure Evaluation (Signed)
  Anesthesia Post-op Note  Patient: Christine Garcia  Procedure(s) Performed: Procedure(s): OPEN REDUCTION INTERNAL FIXATION (ORIF) LEFT PROXIMAL HUMERUS FRACTURE (Left)  Patient Location: PACU  Anesthesia Type:GA combined with regional for post-op pain  Level of Consciousness: awake  Airway and Oxygen Therapy: Patient Spontanous Breathing  Post-op Pain: none  Post-op Assessment: Post-op Vital signs reviewed, Patient's Cardiovascular Status Stable, Respiratory Function Stable, Patent Airway, No signs of Nausea or vomiting and Pain level controlled              Post-op Vital Signs: Reviewed and stable  Last Vitals:  Filed Vitals:   01/13/15 1315  BP:   Pulse: 71  Temp:   Resp: 24    Complications: No apparent anesthesia complications

## 2015-02-05 ENCOUNTER — Other Ambulatory Visit: Payer: Self-pay | Admitting: Cardiology

## 2015-02-20 ENCOUNTER — Other Ambulatory Visit: Payer: Self-pay | Admitting: Cardiology

## 2015-02-21 NOTE — Telephone Encounter (Signed)
REFILL 

## 2015-03-01 ENCOUNTER — Telehealth: Payer: Self-pay | Admitting: Internal Medicine

## 2015-03-01 NOTE — Telephone Encounter (Signed)
yeah

## 2015-03-01 NOTE — Telephone Encounter (Signed)
Pt called in and just wanted to thank you for your help with the letter that you provider her.  She just wanted to make sure that you knew how much she appreciated you help.

## 2015-03-10 ENCOUNTER — Other Ambulatory Visit: Payer: Self-pay | Admitting: Internal Medicine

## 2015-03-10 DIAGNOSIS — F418 Other specified anxiety disorders: Secondary | ICD-10-CM

## 2015-03-15 ENCOUNTER — Encounter: Payer: Self-pay | Admitting: Physical Medicine & Rehabilitation

## 2015-04-19 ENCOUNTER — Ambulatory Visit (HOSPITAL_BASED_OUTPATIENT_CLINIC_OR_DEPARTMENT_OTHER): Payer: BLUE CROSS/BLUE SHIELD | Admitting: Physical Medicine & Rehabilitation

## 2015-04-19 ENCOUNTER — Encounter: Payer: BLUE CROSS/BLUE SHIELD | Attending: Physical Medicine & Rehabilitation

## 2015-04-19 ENCOUNTER — Encounter: Payer: Self-pay | Admitting: Physical Medicine & Rehabilitation

## 2015-04-19 VITALS — BP 150/103 | HR 82

## 2015-04-19 DIAGNOSIS — G90511 Complex regional pain syndrome I of right upper limb: Secondary | ICD-10-CM

## 2015-04-19 DIAGNOSIS — M24541 Contracture, right hand: Secondary | ICD-10-CM | POA: Insufficient documentation

## 2015-04-19 MED ORDER — GABAPENTIN 300 MG PO CAPS: 300.0000 mg | ORAL_CAPSULE | Freq: Two times a day (BID) | ORAL | Status: DC

## 2015-04-19 NOTE — Patient Instructions (Signed)
Vocational rehabilitation is state agency that can help retraining for jobs.  The name of the procedure for complex regional pain syndrome is called stellate ganglion block. It is done under IV sedation in the surgery center     Stellate Ganglion Block Patient Information  Description: The stellate ganglion is part of a chain of nerves in the neck that innervate the shoulder, arm, and hand.  This chain of nerves lies beside the windpipe.  By injecting local anesthesia along the stellate ganglion, we block the nerve innervation to the arm and hand on that particular side.  Generally only sensory innervation (touch) is blocked and not motor innervation (strength).  This allows pain-free physical therapy, which is vital to many of the conditions that this nerve block helps.   After numbing the skin with local anesthesia, a small needle is passed to the stellate ganglion and the local anesthesia is again deposited.  The procedure generally takes less than one minute.  Conditions that my be helped with stellate ganglion blockade;   Reflex sympathetic dystrophy (RSD)  Postherpetic neuralgia  Diabetic neuropathy  Pain from nerve lesions or injury  Herpes zoster ( shingles  Vascular disease  Raynaud's disease  Preparation for the injection: 1. Do not eat any solid food or dairy products within 6 hours of your appointment. 2. You may drink clear liquids up to 2 hours before appointment.  Clear liquids include water, black coffee, juice or soda.  No milk or cream please. 3. You may take your regular medication, including pain medications, with a sip of water before you appointment.  Diabetics should hold regular insulin (if taken separately) and take 1/2 normal NPH dose the morning of the procedure.  Carry some sugar containing items with you to your appointment. 4. A drive must accompany you and be prepared to drive you home after your procedure. 5. Bring all your current medications with  you. 6. An IV may be inserted and sedation may be given at the discretion of the physician. 7. A blood pressure cuff, EKG and other monitors will often be applied during the procedure.  Some patients may need to have extra oxygen administered for a short period. 8. You will be asked to provide medical information, including your allergies, prior to the procedure.  We must know immediately if you are taking blood thinners (like Coumadin) or if you are allergic to IV iodine contrast (dye).  Possible side-effects   Bleeding from needle site  Blurry vision and droopy eyelid in that eye (temporary)  Stuffy nose  Temporary shortness of breath  Weakness in arm (temporary)  Arm may feel warm   Possible complications: All of these are extremely RARE   Seizures  Infection  Lung puncture  Bleeding in the neck  Nerve injury  Call if you experience:   Fever/chills  Extreme shortness of breath or inability to swallow  Redness, inflammation or drainage from the site  Weakness or numbness that last for greater than 24 hours  Please note:  If effective, we may have to do up to 3-5 blocks in a 3-4 week interval with physical therapy (depending on which condition you are being treated for).  After the procedure: You will be observed in the outpatient area and discharged home.  Please consult the discharge instructions given after the procedure for any post-procedure questions.  If you have any questions please call [phone] [facility]

## 2015-04-19 NOTE — Progress Notes (Signed)
Subjective:    Patient ID: Christine Garcia, female    DOB: 03-20-1961, 54 y.o.   MRN: [id number]  HPI Chief complaint is right hand pain  54 year old female with history of Dupuytren's contractures in multiple digits. She had digit 4 contracture on the right as well as digit 2 on the left. Patient states she also has similar issues in both feet. She underwent release of contracture in April 2016. It initially improved but then she developed recurrent contracture at the right fourth digit and also the right fifth digit. Patient followed up with her surgeon and no further surgical treatment was recommended. She underwent outpatient occupational therapy for a couple weeks without improvement. Her surgeon also recommended some type of injection for the neck and patient also saw a different hand surgeon as well.    Pain Inventory Average Pain 6 Pain Right Now 6 My pain is burning and tingling  In the last 24 hours, has pain interfered with the following? General activity 4 Relation with others 4 Enjoyment of life 4 What TIME of day is your pain at its worst? daytime Sleep (in general) Fair  Pain is worse with: some activites Pain improves with: NA Relief from Meds: 5  Mobility walk without assistance how many minutes can you walk? 10-15 ability to climb steps?  yes do you drive?  no Do you have any goals in this area?  yes  Function not employed: date last employed NA  Neuro/Psych No problems in this area  Prior Studies Any changes since last visit?  no  Physicians involved in your care Any changes since last visit?  no   Family History  Problem Relation Age of Onset  . Heart attack Sister 28  . Stroke Mother   . Congestive Heart Failure Father   . Heart attack Mother   . COPD Father   . CVA Father    Social History   Social History  . Marital Status: Married    Spouse Name: N/A  . Number of Children: 1  . Years of Education: N/A   Occupational History   . Server    Social History Main Topics  . Smoking status: Never Smoker   . Smokeless tobacco: Never Used  . Alcohol Use: 0.0 oz/week    0 Standard drinks or equivalent per week     Comment: 3-4 beers daily  . Drug Use: No  . Sexual Activity: Yes   Other Topics Concern  . None   Social History Narrative   Past Surgical History  Procedure Laterality Date  . Dilation and curettage of uterus  2002  . Cardiac catheterization  07/14/2010    "nonobstructive CAD"  . Fasciectomy Right 09/02/2014    Procedure: RIGHT HAND FASCIECTOMY RIGHT RING AND RIGHT MIDDLE FINGER ;  Surgeon: Cindee Salt, MD;  [facility];  Service: Orthopedics;  Laterality: Right;  . Orif humerus fracture Left 01/13/2015    Procedure: OPEN REDUCTION INTERNAL FIXATION (ORIF) LEFT PROXIMAL HUMERUS FRACTURE;  Surgeon: Francena Hanly, MD;  Location: [facility];  Service: Orthopedics;  Laterality: Left;   Past Medical History  Diagnosis Date  . GERD (gastroesophageal reflux disease)   . Migraines     occasional  . Seasonal allergies   . History of atrial fibrillation     one occurrence - states K+ was low at the time  . Murmur     states no problems  . Hypertension     under control with  med., has been on med. x 2 yr.  . High triglycerides   . Dupuytren's contracture of right hand 08/2014    ring/middle fingers  . Complication of anesthesia     states "is hard to numb"  . Anxiety    BP 150/103 mmHg  Pulse 82  SpO2 97%  LMP 09/15/2011  Opioid Risk Score:   Fall Risk Score:  `1  Depression screen PHQ 2/9  Depression screen PHQ 2/9 04/19/2015  Decreased Interest 1  Down, Depressed, Hopeless 2  PHQ - 2 Score 3  Altered sleeping 2  Tired, decreased energy 2  Change in appetite 2  Feeling bad or failure about yourself  2  Trouble concentrating 2  Moving slowly or fidgety/restless 0  Suicidal thoughts 0  PHQ-9 Score 13  Difficult doing work/chores Very difficult     Review of Systems    All other systems reviewed and are negative.      Objective:   Physical Exam  Constitutional: She is oriented to person, place, and time. She appears well-developed and well-nourished.  HENT:  Head: Normocephalic and atraumatic.  Right Ear: External ear normal.  Left Ear: External ear normal.  Eyes: Conjunctivae and EOM are normal. Pupils are equal, round, and reactive to light.  Neck: Normal range of motion. Neck supple.  Cardiovascular: Normal rate and regular rhythm.   Pulmonary/Chest: Effort normal and breath sounds normal.  Abdominal: Soft. Bowel sounds are normal.  Neurological: She is alert and oriented to person, place, and time. No sensory deficit. Gait normal.  Hyperalgesia to touch over the fourth and fifth digit of theRight hand as well As the palm. No hyperalgesia over the thumb index and middle finger. No hyperalgesia over the dorsum of the hand. There is evidence of joint swelling at the right third PIP but no soft tissue edema.  Motor strength is 5/5 in the right deltoid biceps and wrist flexor and extensor as well as digit 123 flexion and extension strength. Digits 4 and 5 not tested secondary to flexion contractures at the MCP and PIP Left deltoid is 3 minus due to limited range of motion, 5 at the bicep tricep finger flexors and extensors as well as wrist flexors and extensors. Bilateral hip flexor and knee extensor and ankle dorsiflex and plantarflex her have 5/5 strength   Psychiatric: Her speech is normal and behavior is normal. Judgment and thought content normal. Her mood appears anxious. Her affect is labile. Cognition and memory are normal.  Starts crying when she describes her history. "I've been through a lot"  Nursing note and vitals reviewed.  Evidence of Dupuytren's contracture in the right third and fourth digit there is flexion contracture at the PIP and MCP in the right fourth and fifth digit, there is Dupuytren's contracture in the left second and  third digits  wrist joint swelling. Temperature at distal wrist crease left 91F Distal wrist crease right 64F  Full range of motion right elbow right shoulder Full range of motion left elbow Reduced shoulder abduction and flexion to 90      Assessment & Plan:  1. Complex regional pain syndrome type I affecting the right medial hand and fingers, Diagnosis is consistent with below criteria   2012 International Association for the Study of Pain (IASP) diagnostic criteria (also called Budapest criteria or Harden/Bruehl criteria)  continuing pain disproportionate to any inciting event  must report at least 1 symptom in ? 3 of the following categories  sensory - allodynia and/or  hyperesthesia  vasomotor - temperature asymmetry, skin color changes, and/or skin color asymmetry  sudomotor/edema - edema, sweating changes, and/or sweating asymmetry  motor/trophic - decreased range of motion, motor dysfunction (weakness, tremor, or dystonia), and/or trophic changes (in hair, nails, or skin)  must have at least 1 sign at time of evaluation in ? 2 of the following categories  sensory - allodynia (to light touch, deep somatic pressure, or joint movement) and/or hyperalgesia (to pinprick)  vasomotor - temperature asymmetry (> 1 degree C [1.8 degrees F]), skin color changes, and/or skin color asymmetry  sudomotor/edema - evidence of edema, sweating changes, and/or sweating asymmetry  motor/trophic - evidence of decreased range of motion, motor dysfunction (weakness, tremor, or dystonia), and/or trophic changes (in hair, nails, or skin)   We discussed treatment options including medication management which is generally nonnarcotic as well as interventional consisting of a series of stellate ganglion blocks on the right At this point she does not want to consider interventional procedures because she is "scared of needles". We did discuss that these can be done under IV sedation. She will  think about this, information has been printed for her. We'll start a trial of gabapentin 300 mg twice a day may need to go up to 3 times a day or even 4 times a day. If she becomes excessively sedated on this dose may need to get down to 100 mg to start with. Other medications may include Topamax or Cymbalta  2. 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 She has seen 2 surgeons who do not think surgical intervention would be helpful for this. 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 Recommend that she contacts vocational rehabilitation. I think she is at maximal medical improvement in terms of her hand function

## 2015-05-30 ENCOUNTER — Other Ambulatory Visit: Payer: Self-pay | Admitting: Cardiology

## 2015-05-30 NOTE — Telephone Encounter (Signed)
Rx request sent to pharmacy.  

## 2015-05-31 ENCOUNTER — Ambulatory Visit: Payer: BLUE CROSS/BLUE SHIELD | Admitting: Physical Medicine & Rehabilitation

## 2015-05-31 ENCOUNTER — Encounter: Payer: BLUE CROSS/BLUE SHIELD | Attending: Physical Medicine & Rehabilitation

## 2015-05-31 DIAGNOSIS — M24541 Contracture, right hand: Secondary | ICD-10-CM | POA: Insufficient documentation

## 2015-05-31 DIAGNOSIS — G90511 Complex regional pain syndrome I of right upper limb: Secondary | ICD-10-CM | POA: Insufficient documentation

## 2015-08-30 ENCOUNTER — Encounter (HOSPITAL_COMMUNITY): Payer: Self-pay | Admitting: Emergency Medicine

## 2015-08-30 ENCOUNTER — Emergency Department (HOSPITAL_COMMUNITY)
Admission: EM | Admit: 2015-08-30 | Discharge: 2015-08-30 | Disposition: A | Discharge status: Home / Self Care | Payer: BLUE CROSS/BLUE SHIELD | Attending: Emergency Medicine | Admitting: Emergency Medicine

## 2015-08-30 ENCOUNTER — Emergency Department (HOSPITAL_COMMUNITY): Payer: BLUE CROSS/BLUE SHIELD

## 2015-08-30 DIAGNOSIS — Z7952 Long term (current) use of systemic steroids: Secondary | ICD-10-CM | POA: Diagnosis not present

## 2015-08-30 DIAGNOSIS — Z880 Allergy status to penicillin: Secondary | ICD-10-CM | POA: Insufficient documentation

## 2015-08-30 DIAGNOSIS — Z7982 Long term (current) use of aspirin: Secondary | ICD-10-CM | POA: Insufficient documentation

## 2015-08-30 DIAGNOSIS — S29001A Unspecified injury of muscle and tendon of front wall of thorax, initial encounter: Secondary | ICD-10-CM | POA: Diagnosis present

## 2015-08-30 DIAGNOSIS — Z79899 Other long term (current) drug therapy: Secondary | ICD-10-CM | POA: Insufficient documentation

## 2015-08-30 DIAGNOSIS — E781 Pure hyperglyceridemia: Secondary | ICD-10-CM | POA: Insufficient documentation

## 2015-08-30 DIAGNOSIS — G43909 Migraine, unspecified, not intractable, without status migrainosus: Secondary | ICD-10-CM | POA: Insufficient documentation

## 2015-08-30 DIAGNOSIS — F419 Anxiety disorder, unspecified: Secondary | ICD-10-CM | POA: Insufficient documentation

## 2015-08-30 DIAGNOSIS — S3992XA Unspecified injury of lower back, initial encounter: Secondary | ICD-10-CM | POA: Insufficient documentation

## 2015-08-30 DIAGNOSIS — Z9889 Other specified postprocedural states: Secondary | ICD-10-CM | POA: Insufficient documentation

## 2015-08-30 DIAGNOSIS — W182XXA Fall in (into) shower or empty bathtub, initial encounter: Secondary | ICD-10-CM | POA: Insufficient documentation

## 2015-08-30 DIAGNOSIS — Z8739 Personal history of other diseases of the musculoskeletal system and connective tissue: Secondary | ICD-10-CM | POA: Insufficient documentation

## 2015-08-30 DIAGNOSIS — I10 Essential (primary) hypertension: Secondary | ICD-10-CM | POA: Diagnosis not present

## 2015-08-30 DIAGNOSIS — S20211A Contusion of right front wall of thorax, initial encounter: Secondary | ICD-10-CM

## 2015-08-30 DIAGNOSIS — W19XXXA Unspecified fall, initial encounter: Secondary | ICD-10-CM

## 2015-08-30 DIAGNOSIS — K219 Gastro-esophageal reflux disease without esophagitis: Secondary | ICD-10-CM | POA: Diagnosis not present

## 2015-08-30 DIAGNOSIS — Y9389 Activity, other specified: Secondary | ICD-10-CM | POA: Diagnosis not present

## 2015-08-30 DIAGNOSIS — Y998 Other external cause status: Secondary | ICD-10-CM | POA: Diagnosis not present

## 2015-08-30 DIAGNOSIS — R011 Cardiac murmur, unspecified: Secondary | ICD-10-CM | POA: Diagnosis not present

## 2015-08-30 DIAGNOSIS — Y9289 Other specified places as the place of occurrence of the external cause: Secondary | ICD-10-CM | POA: Insufficient documentation

## 2015-08-30 LAB — URINE MICROSCOPIC-ADD ON

## 2015-08-30 LAB — URINALYSIS, ROUTINE W REFLEX MICROSCOPIC
GLUCOSE, UA: NEGATIVE mg/dL
Ketones, ur: NEGATIVE mg/dL
Nitrite: NEGATIVE
PH: 5 (ref 5.0–8.0)
PROTEIN: 30 mg/dL — AB
SPECIFIC GRAVITY, URINE: 1.021 (ref 1.005–1.030)

## 2015-08-30 MED ORDER — ONDANSETRON 4 MG PO TBDP
4.0000 mg | ORAL_TABLET | Freq: Once | ORAL | Status: AC
  Administered 2015-08-30: 4 mg via ORAL
  Filled 2015-08-30: qty 1

## 2015-08-30 MED ORDER — OXYCODONE-ACETAMINOPHEN 5-325 MG PO TABS
1.0000 | ORAL_TABLET | Freq: Once | ORAL | Status: AC
  Administered 2015-08-30: 1 via ORAL
  Filled 2015-08-30: qty 1

## 2015-08-30 MED ORDER — HYDROMORPHONE HCL 1 MG/ML IJ SOLN
1.0000 mg | Freq: Once | INTRAMUSCULAR | Status: AC
  Administered 2015-08-30: 1 mg via INTRAMUSCULAR
  Filled 2015-08-30: qty 1

## 2015-08-30 MED ORDER — OXYCODONE-ACETAMINOPHEN 5-325 MG PO TABS: 1.0000 | ORAL_TABLET | Freq: Four times a day (QID) | ORAL | Status: DC | PRN

## 2015-08-30 NOTE — Discharge Instructions (Signed)
Return to the ED with any concerns including weakness of legs, not able to urinate, loss of control of bowel or bladder, difficulty breathing, decreased level of alertness/lethargy, or any other alarming symptoms  You should use the incentive spirometer 10 times every hour to help prevent a pneumonia

## 2015-08-30 NOTE — ED Notes (Addendum)
Per pt, states she fell in the shower this past Saturday-now having back pain

## 2015-08-30 NOTE — ED Notes (Signed)
Discharge instructions, follow up care, and prescriptions reviewed with patient. Patient verbalized understanding. 

## 2015-08-30 NOTE — ED Provider Notes (Signed)
CSN: [id number]     Arrival date & time 08/30/15  1513 History   First MD Initiated Contact with Patient 08/30/15 1521     Chief Complaint  Patient presents with  . Fall     (Consider location/radiation/quality/duration/timing/severity/associated sxs/prior Treatment) HPI  Pt presenting with c/o low back and left posterior rib pain after fall  4 days ago. She was stepping into her tub and feet slipped out from under her.  She has had pain since the fall.  No leg weakness, no urinary retention or incontinence of bowel or bladder.  Has not seen blood in urine.  No difficulty breathing.  She states she has been lying in bed due to pain with movement and walking. She says she has hydrocodone at home but has not been taking it due to nausea.  There are no other associated systemic symptoms, there are no other alleviating or modifying factors.   Past Medical History  Diagnosis Date  . GERD (gastroesophageal reflux disease)   . Migraines     occasional  . Seasonal allergies   . History of atrial fibrillation     one occurrence - states K+ was low at the time  . Murmur     states no problems  . Hypertension     under control with med., has been on med. x 2 yr.  . High triglycerides   . Dupuytren's contracture of right hand 08/2014    ring/middle fingers  . Complication of anesthesia     states "is hard to numb"  . Anxiety    Past Surgical History  Procedure Laterality Date  . Dilation and curettage of uterus  2002  . Cardiac catheterization  07/14/2010    "nonobstructive CAD"  . Fasciectomy Right 09/02/2014    Procedure: RIGHT HAND FASCIECTOMY RIGHT RING AND RIGHT MIDDLE FINGER ;  Surgeon: Cindee Salt, MD;  Location: [facility];  Service: Orthopedics;  Laterality: Right;  . Orif humerus fracture Left 01/13/2015    Procedure: OPEN REDUCTION INTERNAL FIXATION (ORIF) LEFT PROXIMAL HUMERUS FRACTURE;  Surgeon: Francena Hanly, MD;  Location: [facility];  Service: Orthopedics;  Laterality:  Left;   Family History  Problem Relation Age of Onset  . Heart attack Sister 41  . Stroke Mother   . Congestive Heart Failure Father   . Heart attack Mother   . COPD Father   . CVA Father    Social History  Substance Use Topics  . Smoking status: Never Smoker   . Smokeless tobacco: Never Used  . Alcohol Use: 0.0 oz/week    0 Standard drinks or equivalent per week     Comment: 3-4 beers daily   OB History    No data available     Review of Systems  ROS reviewed and all otherwise negative except for mentioned in HPI    Allergies  Penicillins  Home Medications   Prior to Admission medications   Medication Sig Start Date End Date Taking? Authorizing Provider  ALPRAZolam Prudy Feeler) 0.5 MG tablet Take 0.5 mg by mouth daily as needed for anxiety.  08/11/15  Yes Historical Provider, MD  carvedilol (COREG) 6.25 MG tablet TAKE ONE TABLET BY MOUTH TWICE DAILY WITH MEALS 02/07/15  Yes Rollene Rotunda, MD  hydrocortisone cream 0.5 % Apply 1 application topically 2 (two) times daily.   Yes Historical Provider, MD  KLOR-CON M20 20 MEQ tablet TAKE ONE TABLET BY MOUTH TWICE DAILY 05/30/15  Yes Rollene Rotunda, MD  pregabalin (LYRICA) 100  MG capsule Take 100 mg by mouth 2 (two) times daily.   Yes Historical Provider, MD  tiZANidine (ZANAFLEX) 4 MG tablet Take 4 mg by mouth 2 (two) times daily.   Yes Historical Provider, MD  aspirin 81 MG tablet Take 81 mg by mouth daily.      Historical Provider, MD  diazepam (VALIUM) 5 MG tablet Take 1 tablet (5 mg total) by mouth every 6 (six) hours as needed for muscle spasms or sedation. Patient not taking: Reported on 04/19/2015 01/13/15   Ralene Batheracy Shuford, PA-C  ferrous sulfate 325 (65 FE) MG tablet Take 325 mg by mouth daily.      Historical Provider, MD  gabapentin (NEURONTIN) 300 MG capsule Take 1 capsule (300 mg total) by mouth 2 (two) times daily. Patient not taking: Reported on 08/30/2015 04/19/15   Erick ColaceAndrew E Kirsteins, MD  HYDROcodone-acetaminophen (NORCO)  7.5-325 MG tablet Take 1 tablet by mouth every 8 (eight) hours as needed. for pain 08/11/15   Historical Provider, MD  HYDROmorphone (DILAUDID) 2 MG tablet Take 1-2 tablets (2-4 mg total) by mouth every 4 (four) hours as needed for severe pain (not controlled by percocet). Patient not taking: Reported on 04/19/2015 01/13/15   Ralene Batheracy Shuford, PA-C  ibuprofen (ADVIL,MOTRIN) 200 MG tablet Take 400 mg by mouth every 6 (six) hours as needed for moderate pain.    Historical Provider, MD  Lansoprazole (PREVACID 24HR PO) Take 1 capsule by mouth daily.      Historical Provider, MD  Omega-3 1000 MG CAPS Take 1 g by mouth daily.     Historical Provider, MD  omega-3 acid ethyl esters (LOVAZA) 1 G capsule Take 2 capsules (2 g total) by mouth 2 (two) times daily. Patient not taking: Reported on 08/30/2015 02/02/14   Etta Grandchildhomas L Jones, MD  oxyCODONE-acetaminophen (PERCOCET/ROXICET) 5-325 MG tablet Take 1-2 tablets by mouth every 6 (six) hours as needed for severe pain. 08/30/15   Jerelyn ScottMartha Linker, MD  triamterene-hydrochlorothiazide (MAXZIDE-25) 37.5-25 MG per tablet TAKE ONE TABLET BY MOUTH ONCE DAILY. Patient not taking: Reported on 08/30/2015 01/18/15   Rollene RotundaJames Hochrein, MD  Vitamin D, Ergocalciferol, (DRISDOL) 50000 units CAPS capsule Take 50,000 Units by mouth every 7 (seven) days.  08/13/15   Historical Provider, MD   BP 113/94 mmHg  Pulse 98  Temp(Src) 98.1 F (36.7 C) (Oral)  Resp 18  SpO2 100%  LMP 09/15/2011  Vitals reviewed Physical Exam  Physical Examination: General appearance - alert, well appearing, and in no distress Mental status - alert, oriented to person, place, and time Eyes -no conjunctival injection no scleral icterus Neck - supple, no significant adenopathy Chest - clear to auscultation, no wheezes, rales or rhonchi, symmetric air entry, no crepitus, no ttp over anterior chest Heart - normal rate, regular rhythm, normal S1, S2, no murmurs, rubs, clicks or gallops Abdomen - soft, nontender,  nondistended, no masses or organomegaly Back exam - mid midline lumbar tenderness, bruising over right CVA and lower ribs posteriorly Neurological - alert, oriented, strength 5/5 in extremities x 4, sensation intact Musculoskeletal - no joint tenderness, deformity or swelling Extremities - peripheral pulses normal, no pedal edema, no clubbing or cyanosis Skin - normal coloration and turgor, no rashes  ED Course  Procedures (including critical care time) Labs Review Labs Reviewed  URINE CULTURE - Abnormal; Notable for the following:    Culture >=100,000 COLONIES/mL ESCHERICHIA COLI (*)    All other components within normal limits  URINALYSIS, ROUTINE W REFLEX MICROSCOPIC (NOT AT [facility]) -  Abnormal; Notable for the following:    Color, Urine AMBER (*)    APPearance CLOUDY (*)    Hgb urine dipstick TRACE (*)    Bilirubin Urine SMALL (*)    Protein, ur 30 (*)    Leukocytes, UA SMALL (*)    All other components within normal limits  URINE MICROSCOPIC-ADD ON - Abnormal; Notable for the following:    Squamous Epithelial / LPF 0-5 (*)    Bacteria, UA MANY (*)    Casts HYALINE CASTS (*)    All other components within normal limits    Imaging Review No results found. I have personally reviewed and evaluated these images and lab results as part of my medical decision-making.   EKG Interpretation None      MDM   Final diagnoses:  Fall, initial encounter  Rib contusion, right, initial encounter    Pt presenting with c/o low back pain and bruising over left posterior ribs.  xrays are reassuring.  Pt treated for pain in the ED with some improvement in her pain  She has chronic vertebral and rib fractures that are not new on xrays today. Pt given incentive spirometer as well.  Discharged with strict return precautions.  Pt agreeable with plan.    Jerelyn Scott, MD 09/01/15 2116

## 2015-09-02 LAB — URINE CULTURE: Culture: >=100000 — AB

## 2015-09-03 ENCOUNTER — Telehealth: Payer: Self-pay

## 2015-09-03 NOTE — Progress Notes (Signed)
ED Antimicrobial Stewardship Positive Culture Follow Up   Christine Garcia is an 55 y.o. female [facility] with a chief complaint of  Chief Complaint  Patient presents with  . Fall    Recent Results (from the past 720 hour(s))  Urine culture     Status: Abnormal   Collection Time: 08/30/15  5:40 PM  Result Value Ref Range Status   Specimen Description URINE, CLEAN CATCH  Final   Special Requests NONE  Final   Culture >=100,000 COLONIES/mL ESCHERICHIA COLI (A)  Final   Report Status 09/02/2015 FINAL  Final   Organism ID, Bacteria ESCHERICHIA COLI (A)  Final      Susceptibility   Escherichia coli - MIC*    AMPICILLIN >=32 RESISTANT Resistant     CEFAZOLIN <=4 SENSITIVE Sensitive     CEFTRIAXONE <=1 SENSITIVE Sensitive     CIPROFLOXACIN <=0.25 SENSITIVE Sensitive     GENTAMICIN <=1 SENSITIVE Sensitive     IMIPENEM <=0.25 SENSITIVE Sensitive     NITROFURANTOIN <=16 SENSITIVE Sensitive     TRIMETH/SULFA >=320 RESISTANT Resistant     AMPICILLIN/SULBACTAM >=32 RESISTANT Resistant     PIP/TAZO <=4 SENSITIVE Sensitive     * >=100,000 COLONIES/mL ESCHERICHIA COLI     Pt without signs or symptoms of a UTI.  UA negative. No treatment indicated.  ED Provider: Elizabeth SauerJaime Ward, PA-C  Jhayden Demuro L. Roseanne RenoStewart, PharmD PGY2 Infectious Diseases Pharmacy Resident Pager: [phone] 09/03/2015 8:03 AM

## 2015-09-03 NOTE — Telephone Encounter (Signed)
Post ED Visit - Positive Culture Follow-up  Culture report reviewed by antimicrobial stewardship pharmacist:  []  Enzo BiNathan Batchelder, Pharm.D. []  Celedonio MiyamotoJeremy Frens, Pharm.D., BCPS []  Garvin FilaMike Maccia, Pharm.D. []  Georgina PillionElizabeth Martin, Pharm.D., BCPS []  West MountainMinh Pham, [address] Rainbow BoulevardPharm.D., BCPS, AAHIVP []  Estella HuskMichelle Turner, Pharm.D., BCPS, AAHIVP [x]  Tennis Mustassie Stewart, Pharm.D. []  Sherle Poeob Vincent, [place]D.  Positive urine culture, no further patient follow-up is required at this time.  Jerry CarasCullom, Carri Spillers Burnett 09/03/2015, 8:18 AM

## 2015-09-22 ENCOUNTER — Ambulatory Visit (INDEPENDENT_AMBULATORY_CARE_PROVIDER_SITE_OTHER): Payer: BLUE CROSS/BLUE SHIELD | Admitting: Family

## 2015-09-22 ENCOUNTER — Encounter: Payer: Self-pay | Admitting: Family

## 2015-09-22 VITALS — BP 128/90 | HR 74 | Temp 98.0°F | Ht 67.0 in | Wt 165.5 lb

## 2015-09-22 DIAGNOSIS — F329 Major depressive disorder, single episode, unspecified: Secondary | ICD-10-CM | POA: Diagnosis not present

## 2015-09-22 DIAGNOSIS — M545 Low back pain: Secondary | ICD-10-CM

## 2015-09-22 DIAGNOSIS — G8929 Other chronic pain: Secondary | ICD-10-CM

## 2015-09-22 DIAGNOSIS — F32A Depression, unspecified: Secondary | ICD-10-CM

## 2015-09-22 MED ORDER — DICLOFENAC SODIUM 1 % TD GEL: 4.0000 g | Freq: Four times a day (QID) | TRANSDERMAL | Status: DC

## 2015-09-22 MED ORDER — DULOXETINE HCL 20 MG PO CPEP: ORAL_CAPSULE | ORAL | Status: DC

## 2015-09-22 NOTE — Progress Notes (Signed)
Pre visit review using our clinic review tool, if applicable. No additional management support is needed unless otherwise documented below in the visit note. 

## 2015-09-22 NOTE — Patient Instructions (Signed)
Trial of Cymbalta. Starting at low dose and we may need to increase. Follow-up in one month.  If there is no improvement in your symptoms, or if there is any worsening of symptoms, or if you have any additional concerns, please return for re-evaluation; or, if we are closed, consider going to the Emergency Room for evaluation if symptoms urgent.  Low Back Sprain With Rehab A sprain is an injury in which a ligament is torn. The ligaments of the lower back are vulnerable to sprains. However, they are strong and require great force to be injured. These ligaments are important for stabilizing the spinal column. Sprains are classified into three categories. Grade 1 sprains cause pain, but the tendon is not lengthened. Grade 2 sprains include a lengthened ligament, due to the ligament being stretched or partially ruptured. With grade 2 sprains there is still function, although the function may be decreased. Grade 3 sprains involve a complete tear of the tendon or muscle, and function is usually impaired. SYMPTOMS   Severe pain in the lower back.  Sometimes, a feeling of a "pop," "snap," or tear, at the time of injury.  Tenderness and sometimes swelling at the injury site.  Uncommonly, bruising (contusion) within 48 hours of injury.  Muscle spasms in the back. CAUSES  Low back sprains occur when a force is placed on the ligaments that is greater than they can handle. Common causes of injury include:  Performing a stressful act while off-balance.  Repetitive stressful activities that involve movement of the lower back.  Direct hit (trauma) to the lower back. RISK INCREASES WITH:  Contact sports (football, wrestling).  Collisions (major skiing accidents).  Sports that require throwing or lifting (baseball, weightlifting).  Sports involving twisting of the spine (gymnastics, diving, tennis, golf).  Poor strength and flexibility.  Inadequate protection.  Previous back injury or surgery  (especially fusion). PREVENTION  Wear properly fitted and padded protective equipment.  Warm up and stretch properly before activity.  Allow for adequate recovery between workouts.  Maintain physical fitness:  Strength, flexibility, and endurance.  Cardiovascular fitness.  Maintain a healthy body weight. PROGNOSIS  If treated properly, low back sprains usually heal with non-surgical treatment. The length of time for healing depends on the severity of the injury.  RELATED COMPLICATIONS   Recurring symptoms, resulting in a chronic problem.  Chronic inflammation and pain in the low back.  Delayed healing or resolution of symptoms, especially if activity is resumed too soon.  Prolonged impairment.  Unstable or arthritic joints of the low back. TREATMENT  Treatment first involves the use of ice and medicine, to reduce pain and inflammation. The use of strengthening and stretching exercises may help reduce pain with activity. These exercises may be performed at home or with a therapist. Severe injuries may require referral to a therapist for further evaluation and treatment, such as ultrasound. Your caregiver may advise that you wear a back brace or corset, to help reduce pain and discomfort. Often, prolonged bed rest results in greater harm then benefit. Corticosteroid injections may be recommended. However, these should be reserved for the most serious cases. It is important to avoid using your back when lifting objects. At night, sleep on your back on a firm mattress, with a pillow placed under your knees. If non-surgical treatment is unsuccessful, surgery may be needed.  MEDICATION   If pain medicine is needed, nonsteroidal anti-inflammatory medicines (aspirin and ibuprofen), or other minor pain relievers (acetaminophen), are often advised.  Do not  take pain medicine for 7 days before surgery.  Prescription pain relievers may be given, if your caregiver thinks they are needed. Use  only as directed and only as much as you need.  Ointments applied to the skin may be helpful.  Corticosteroid injections may be given by your caregiver. These injections should be reserved for the most serious cases, because they may only be given a certain number of times. HEAT AND COLD  Cold treatment (icing) should be applied for 10 to 15 minutes every 2 to 3 hours for inflammation and pain, and immediately after activity that aggravates your symptoms. Use ice packs or an ice massage.  Heat treatment may be used before performing stretching and strengthening activities prescribed by your caregiver, physical therapist, or athletic trainer. Use a heat pack or a warm water soak. SEEK MEDICAL CARE IF:   Symptoms get worse or do not improve in 2 to 4 weeks, despite treatment.  You develop numbness or weakness in either leg.  You lose bowel or bladder function.  Any of the following occur after surgery: fever, increased pain, swelling, redness, drainage of fluids, or bleeding in the affected area.  New, unexplained symptoms develop. (Drugs used in treatment may produce side effects.) EXERCISES  RANGE OF MOTION (ROM) AND STRETCHING EXERCISES - Low Back Sprain Most people with lower back pain will find that their symptoms get worse with excessive bending forward (flexion) or arching at the lower back (extension). The exercises that will help resolve your symptoms will focus on the opposite motion.  Your physician, physical therapist or athletic trainer will help you determine which exercises will be most helpful to resolve your lower back pain. Do not complete any exercises without first consulting with your caregiver. Discontinue any exercises which make your symptoms worse, until you speak to your caregiver. If you have pain, numbness or tingling which travels down into your buttocks, leg or foot, the goal of the therapy is for these symptoms to move closer to your back and eventually resolve.  Sometimes, these leg symptoms will get better, but your lower back pain may worsen. This is often an indication of progress in your rehabilitation. Be very alert to any changes in your symptoms and the activities in which you participated in the 24 hours prior to the change. Sharing this information with your caregiver will allow him or her to most efficiently treat your condition. These exercises may help you when beginning to rehabilitate your injury. Your symptoms may resolve with or without further involvement from your physician, physical therapist or athletic trainer. While completing these exercises, remember:   Restoring tissue flexibility helps normal motion to return to the joints. This allows healthier, less painful movement and activity.  An effective stretch should be held for at least 30 seconds.  A stretch should never be painful. You should only feel a gentle lengthening or release in the stretched tissue. FLEXION RANGE OF MOTION AND STRETCHING EXERCISES: STRETCH - Flexion, Single Knee to Chest   Lie on a firm bed or floor with both legs extended in front of you.  Keeping one leg in contact with the floor, bring your opposite knee to your chest. Hold your leg in place by either grabbing behind your thigh or at your knee.  Pull until you feel a gentle stretch in your low back. Hold __________ seconds.  Slowly release your grasp and repeat the exercise with the opposite side. Repeat __________ times. Complete this exercise __________ times per day.  STRETCH - Flexion, Double Knee to Chest  Lie on a firm bed or floor with both legs extended in front of you.  Keeping one leg in contact with the floor, bring your opposite knee to your chest.  Tense your stomach muscles to support your back and then lift your other knee to your chest. Hold your legs in place by either grabbing behind your thighs or at your knees.  Pull both knees toward your chest until you feel a gentle stretch  in your low back. Hold __________ seconds.  Tense your stomach muscles and slowly return one leg at a time to the floor. Repeat __________ times. Complete this exercise __________ times per day.  STRETCH - Low Trunk Rotation  Lie on a firm bed or floor. Keeping your legs in front of you, bend your knees so they are both pointed toward the ceiling and your feet are flat on the floor.  Extend your arms out to the side. This will stabilize your upper body by keeping your shoulders in contact with the floor.  Gently and slowly drop both knees together to one side until you feel a gentle stretch in your low back. Hold for __________ seconds.  Tense your stomach muscles to support your lower back as you bring your knees back to the starting position. Repeat the exercise to the other side. Repeat __________ times. Complete this exercise __________ times per day  EXTENSION RANGE OF MOTION AND FLEXIBILITY EXERCISES: STRETCH - Extension, Prone on Elbows   Lie on your stomach on the floor, a bed will be too soft. Place your palms about shoulder width apart and at the height of your head.  Place your elbows under your shoulders. If this is too painful, stack pillows under your chest.  Allow your body to relax so that your hips drop lower and make contact more completely with the floor.  Hold this position for __________ seconds.  Slowly return to lying flat on the floor. Repeat __________ times. Complete this exercise __________ times per day.  RANGE OF MOTION - Extension, Prone Press Ups  Lie on your stomach on the floor, a bed will be too soft. Place your palms about shoulder width apart and at the height of your head.  Keeping your back as relaxed as possible, slowly straighten your elbows while keeping your hips on the floor. You may adjust the placement of your hands to maximize your comfort. As you gain motion, your hands will come more underneath your shoulders.  Hold this position  __________ seconds.  Slowly return to lying flat on the floor. Repeat __________ times. Complete this exercise __________ times per day.  RANGE OF MOTION- Quadruped, Neutral Spine   Assume a hands and knees position on a firm surface. Keep your hands under your shoulders and your knees under your hips. You may place padding under your knees for comfort.  Drop your head and point your tailbone toward the ground below you. This will round out your lower back like an angry cat. Hold this position for __________ seconds.  Slowly lift your head and release your tail bone so that your back sags into a large arch, like an old horse.  Hold this position for __________ seconds.  Repeat this until you feel limber in your low back.  Now, find your "sweet spot." This will be the most comfortable position somewhere between the two previous positions. This is your neutral spine. Once you have found this position, tense your stomach  muscles to support your low back.  Hold this position for __________ seconds. Repeat __________ times. Complete this exercise __________ times per day.  STRENGTHENING EXERCISES - Low Back Sprain These exercises may help you when beginning to rehabilitate your injury. These exercises should be done near your "sweet spot." This is the neutral, low-back arch, somewhere between fully rounded and fully arched, that is your least painful position. When performed in this safe range of motion, these exercises can be used for people who have either a flexion or extension based injury. These exercises may resolve your symptoms with or without further involvement from your physician, physical therapist or athletic trainer. While completing these exercises, remember:   Muscles can gain both the endurance and the strength needed for everyday activities through controlled exercises.  Complete these exercises as instructed by your physician, physical therapist or athletic trainer. Increase  the resistance and repetitions only as guided.  You may experience muscle soreness or fatigue, but the pain or discomfort you are trying to eliminate should never worsen during these exercises. If this pain does worsen, stop and make certain you are following the directions exactly. If the pain is still present after adjustments, discontinue the exercise until you can discuss the trouble with your caregiver. STRENGTHENING - Deep Abdominals, Pelvic Tilt   Lie on a firm bed or floor. Keeping your legs in front of you, bend your knees so they are both pointed toward the ceiling and your feet are flat on the floor.  Tense your lower abdominal muscles to press your low back into the floor. This motion will rotate your pelvis so that your tail bone is scooping upwards rather than pointing at your feet or into the floor. With a gentle tension and even breathing, hold this position for __________ seconds. Repeat __________ times. Complete this exercise __________ times per day.  STRENGTHENING - Abdominals, Crunches   Lie on a firm bed or floor. Keeping your legs in front of you, bend your knees so they are both pointed toward the ceiling and your feet are flat on the floor. Cross your arms over your chest.  Slightly tip your chin down without bending your neck.  Tense your abdominals and slowly lift your trunk high enough to just clear your shoulder blades. Lifting higher can put excessive stress on the lower back and does not further strengthen your abdominal muscles.  Control your return to the starting position. Repeat __________ times. Complete this exercise __________ times per day.  STRENGTHENING - Quadruped, Opposite UE/LE Lift   Assume a hands and knees position on a firm surface. Keep your hands under your shoulders and your knees under your hips. You may place padding under your knees for comfort.  Find your neutral spine and gently tense your abdominal muscles so that you can maintain this  position. Your shoulders and hips should form a rectangle that is parallel with the floor and is not twisted.  Keeping your trunk steady, lift your right hand no higher than your shoulder and then your left leg no higher than your hip. Make sure you are not holding your breath. Hold this position for __________ seconds.  Continuing to keep your abdominal muscles tense and your back steady, slowly return to your starting position. Repeat with the opposite arm and leg. Repeat __________ times. Complete this exercise __________ times per day.  STRENGTHENING - Abdominals and Quadriceps, Straight Leg Raise   Lie on a firm bed or floor with both legs extended  in front of you.  Keeping one leg in contact with the floor, bend the other knee so that your foot can rest flat on the floor.  Find your neutral spine, and tense your abdominal muscles to maintain your spinal position throughout the exercise.  Slowly lift your straight leg off the floor about 6 inches for a count of 15, making sure to not hold your breath.  Still keeping your neutral spine, slowly lower your leg all the way to the floor. Repeat this exercise with each leg __________ times. Complete this exercise __________ times per day. POSTURE AND BODY MECHANICS CONSIDERATIONS - Low Back Sprain Keeping correct posture when sitting, standing or completing your activities will reduce the stress put on different body tissues, allowing injured tissues a chance to heal and limiting painful experiences. The following are general guidelines for improved posture. Your physician or physical therapist will provide you with any instructions specific to your needs. While reading these guidelines, remember:  The exercises prescribed by your provider will help you have the flexibility and strength to maintain correct postures.  The correct posture provides the best environment for your joints to work. All of your joints have less wear and tear when  properly supported by a spine with good posture. This means you will experience a healthier, less painful body.  Correct posture must be practiced with all of your activities, especially prolonged sitting and standing. Correct posture is as important when doing repetitive low-stress activities (typing) as it is when doing a single heavy-load activity (lifting). RESTING POSITIONS Consider which positions are most painful for you when choosing a resting position. If you have pain with flexion-based activities (sitting, bending, stooping, squatting), choose a position that allows you to rest in a less flexed posture. You would want to avoid curling into a fetal position on your side. If your pain worsens with extension-based activities (prolonged standing, working overhead), avoid resting in an extended position such as sleeping on your stomach. Most people will find more comfort when they rest with their spine in a more neutral position, neither too rounded nor too arched. Lying on a non-sagging bed on your side with a pillow between your knees, or on your back with a pillow under your knees will often provide some relief. Keep in mind, being in any one position for a prolonged period of time, no matter how correct your posture, can still lead to stiffness. PROPER SITTING POSTURE In order to minimize stress and discomfort on your spine, you must sit with correct posture. Sitting with good posture should be effortless for a healthy body. Returning to good posture is a gradual process. Many people can work toward this most comfortably by using various supports until they have the flexibility and strength to maintain this posture on their own. When sitting with proper posture, your ears will fall over your shoulders and your shoulders will fall over your hips. You should use the back of the chair to support your upper back. Your lower back will be in a neutral position, just slightly arched. You may place a small  pillow or folded towel at the base of your lower back for  support.  When working at a desk, create an environment that supports good, upright posture. Without extra support, muscles tire, which leads to excessive strain on joints and other tissues. Keep these recommendations in mind: CHAIR:  A chair should be able to slide under your desk when your back makes contact with the back  of the chair. This allows you to work closely.  The chair's height should allow your eyes to be level with the upper part of your monitor and your hands to be slightly lower than your elbows. BODY POSITION  Your feet should make contact with the floor. If this is not possible, use a foot rest.  Keep your ears over your shoulders. This will reduce stress on your neck and low back. INCORRECT SITTING POSTURES  If you are feeling tired and unable to assume a healthy sitting posture, do not slouch or slump. This puts excessive strain on your back tissues, causing more damage and pain. Healthier options include:  Using more support, like a lumbar pillow.  Switching tasks to something that requires you to be upright or walking.  Talking a brief walk.  Lying down to rest in a neutral-spine position. PROLONGED STANDING WHILE SLIGHTLY LEANING FORWARD  When completing a task that requires you to lean forward while standing in one place for a long time, place either foot up on a stationary 2-4 inch high object to help maintain the best posture. When both feet are on the ground, the lower back tends to lose its slight inward curve. If this curve flattens (or becomes too large), then the back and your other joints will experience too much stress, tire more quickly, and can cause pain. CORRECT STANDING POSTURES Proper standing posture should be assumed with all daily activities, even if they only take a few moments, like when brushing your teeth. As in sitting, your ears should fall over your shoulders and your shoulders should  fall over your hips. You should keep a slight tension in your abdominal muscles to brace your spine. Your tailbone should point down to the ground, not behind your body, resulting in an over-extended swayback posture.  INCORRECT STANDING POSTURES  Common incorrect standing postures include a forward head, locked knees and/or an excessive swayback. WALKING Walk with an upright posture. Your ears, shoulders and hips should all line-up. PROLONGED ACTIVITY IN A FLEXED POSITION When completing a task that requires you to bend forward at your waist or lean over a low surface, try to find a way to stabilize 3 out of 4 of your limbs. You can place a hand or elbow on your thigh or rest a knee on the surface you are reaching across. This will provide you more stability, so that your muscles do not tire as quickly. By keeping your knees relaxed, or slightly bent, you will also reduce stress across your lower back. CORRECT LIFTING TECHNIQUES DO :  Assume a wide stance. This will provide you more stability and the opportunity to get as close as possible to the object which you are lifting.  Tense your abdominals to brace your spine. Bend at the knees and hips. Keeping your back locked in a neutral-spine position, lift using your leg muscles. Lift with your legs, keeping your back straight.  Test the weight of unknown objects before attempting to lift them.  Try to keep your elbows locked down at your sides in order get the best strength from your shoulders when carrying an object.  Always ask for help when lifting heavy or awkward objects. INCORRECT LIFTING TECHNIQUES DO NOT:   Lock your knees when lifting, even if it is a small object.  Bend and twist. Pivot at your feet or move your feet when needing to change directions.  Assume that you can safely pick up even a paperclip without proper posture.  This information is not intended to replace advice given to you by your health care provider. Make  sure you discuss any questions you have with your health care provider.   Document Released: 04/30/2005 Document Revised: 05/21/2014 Document Reviewed: 08/12/2008 [employer] Interactive Patient Education [employer].

## 2015-09-22 NOTE — Progress Notes (Signed)
Subjective:    Patient ID: Christine Garcia, female    DOB: [birth year], 55 y.o.   MRN: [id number]   Christine Garcia is a 55 y.o. female who presents today for an acute visit.    HPI  Patient presents for revaluation of right sided low back pain, worsening. Had fall while getting in bathtub one month ago due to neuropathy in feet. Pain does not radiate.  Follows with neurology for peripheral neuropathy and is on Lyrica. 4 weeks ago had a steriod injection of low spine. Has been taken Norco and Zanaflex for pain without resolve. Denies fever, incontinence, chills, saddle anesthesia, numbness tingling shooting down leg, changes in vision or severe headaches.    Patient endorses depression. Husband lives long distance, and she has not worked since August. A lot of stresses. Denies negative thoughts of hurting oneself or another person.   Per chart review, Patient had fall for 4/18 and was given Dilaudid PO and Percocet at that time. 4/18 X-ray of lumbar spine shows T12 compression fracture. 4/18 Chest x-ray shows suspected old fractures of the right third and fourth ribs anteriorly. Negative acute fracture. Past Medical History  Diagnosis Date  . GERD (gastroesophageal reflux disease)   . Migraines     occasional  . Seasonal allergies   . History of atrial fibrillation     one occurrence - states K+ was low at the time  . Murmur     states no problems  . Hypertension     under control with med., has been on med. x 2 yr.  . High triglycerides   . Dupuytren's contracture of right hand 08/2014    ring/middle fingers  . Complication of anesthesia     states "is hard to numb"  . Anxiety    Allergies: Penicillins Current Outpatient Prescriptions on File Prior to Visit  Medication Sig Dispense Refill  . ALPRAZolam (XANAX) 0.5 MG tablet Take 0.5 mg by mouth daily as needed for anxiety.   0  . aspirin 81 MG tablet Take 81 mg by mouth daily.      . carvedilol (COREG) 6.25 MG tablet TAKE  ONE TABLET BY MOUTH TWICE DAILY WITH MEALS 60 tablet 6  . ferrous sulfate 325 (65 FE) MG tablet Take 325 mg by mouth daily.      Marland Kitchen. HYDROcodone-acetaminophen (NORCO) 7.5-325 MG tablet Take 1 tablet by mouth every 8 (eight) hours as needed. for pain  0  . KLOR-CON M20 20 MEQ tablet TAKE ONE TABLET BY MOUTH TWICE DAILY 180 tablet 2  . Lansoprazole (PREVACID 24HR PO) Take 1 capsule by mouth daily.      . Omega-3 1000 MG CAPS Take 1 g by mouth daily.     . pregabalin (LYRICA) 100 MG capsule Take 100 mg by mouth 2 (two) times daily.    Marland Kitchen. tiZANidine (ZANAFLEX) 4 MG tablet Take 4 mg by mouth 2 (two) times daily.    Marland Kitchen. triamterene-hydrochlorothiazide (MAXZIDE-25) 37.5-25 MG per tablet TAKE ONE TABLET BY MOUTH ONCE DAILY. 30 tablet 10  . Vitamin D, Ergocalciferol, (DRISDOL) 50000 units CAPS capsule Take 50,000 Units by mouth every 7 (seven) days.   2  . ibuprofen (ADVIL,MOTRIN) 200 MG tablet Take 400 mg by mouth every 6 (six) hours as needed for moderate pain. Reported on 09/22/2015     No current facility-administered medications on file prior to visit.    Social History  Substance Use Topics  . Smoking status: Never Smoker   .  Smokeless tobacco: Never Used  . Alcohol Use: 0.0 oz/week    0 Standard drinks or equivalent per week     Comment: 3-4 beers daily    Review of Systems  Constitutional: Negative for fever and chills.  Musculoskeletal: Positive for back pain.  Neurological: Negative for dizziness and headaches.  Psychiatric/Behavioral: Negative for suicidal ideas.      Objective:    BP 128/90 mmHg  Pulse 74  Temp(Src) 98 F (36.7 C) (Oral)  Ht 5\' 7"  (1.702 m)  Wt 165 lb 8 oz (75.07 kg)  BMI 25.91 kg/m2  SpO2 97%  LMP 09/15/2011   Physical Exam  Constitutional: She appears well-developed and well-nourished.  Eyes: Conjunctivae are normal.  Cardiovascular: Normal rate, regular rhythm, normal heart sounds and normal pulses.   Pulmonary/Chest: Effort normal and breath sounds  normal. She has no wheezes. She has no rhonchi. She has no rales.  Musculoskeletal:       Lumbar back: She exhibits pain and spasm. She exhibits normal range of motion, no tenderness, no bony tenderness, no swelling and no edema.       Back:       Arms: Pain spasm right low back as noted on diagram. Negative single leg raise.  Neurological: She is alert.  Reflex Scores:      Patellar reflexes are 2+ on the right side and 2+ on the left side. Strength and sensation intact bilateral lower extremities.  Skin: Skin is warm and dry.  Psychiatric: She has a normal mood and affect. Her speech is normal and behavior is normal. Thought content normal.  Vitals reviewed.      Assessment & Plan:   1. Depression Trial of Cymbalta. As I explained to patient this medication will likely help with neuropathy, anxiety, and depression. Follow-up 1 month. - DULoxetine (CYMBALTA) 20 MG capsule; Take 20 mg by mouth once a day for one week. Then increase to 20 mg twice a day and stay at that dose thereafter.  Dispense: 60 capsule; Refill: 2  2. Chronic low back pain Etiology likely degenerative disc disease/T12 compression fracture seen on spine xray. Follows with neurology for spinal injections. Trial of topical NSAID and at home PT exercises. Exhibits no red flag symptoms including saddle anesthesia, weakness, sciatica.  - diclofenac sodium (VOLTAREN) 1 % GEL; Apply 4 g topically 4 (four) times daily.  Dispense: 1 Tube; Refill: 3 - DULoxetine (CYMBALTA) 20 MG capsule; Take 20 mg by mouth once a day for one week. Then increase to 20 mg twice a day and stay at that dose thereafter.  Dispense: 60 capsule; Refill: 2    I have discontinued Christine Garcia's omega-3 acid ethyl esters, diazepam, HYDROmorphone, gabapentin, hydrocortisone cream, and oxyCODONE-acetaminophen. I am also having her start on diclofenac sodium and DULoxetine. Additionally, I am having her maintain her aspirin, ferrous sulfate, Lansoprazole  (PREVACID 24HR PO), ibuprofen, triamterene-hydrochlorothiazide, carvedilol, KLOR-CON M20, pregabalin, tiZANidine, HYDROcodone-acetaminophen, Omega-3, Vitamin D (Ergocalciferol), and ALPRAZolam.   Meds ordered this encounter  Medications  . diclofenac sodium (VOLTAREN) 1 % GEL    Sig: Apply 4 g topically 4 (four) times daily.    Dispense:  1 Tube    Refill:  3    Order Specific Question:  Supervising Provider    Answer:  Tresa Garter [[id number]]  . DULoxetine (CYMBALTA) 20 MG capsule    Sig: Take 20 mg by mouth once a day for one week. Then increase to 20 mg twice a day and stay at  that dose thereafter.    Dispense:  60 capsule    Refill:  2    Order Specific Question:  Supervising Provider    Answer:  Tresa Garter [[id number]]     Start medications as prescribed and explained to patient on After Visit Summary ( AVS). Risks, benefits, and alternatives of the medications and treatment plan prescribed today were discussed, and patient expressed understanding.   Education regarding symptom management and diagnosis given to patient.   Follow-up:Plan follow-up as discussed or as needed if any worsening symptoms or change in condition.   Continue to follow with Sanda Linger, MD for routine health maintenance.   Christine Garcia and I agreed with plan.   Rennie Plowman, FNP

## 2015-09-29 ENCOUNTER — Other Ambulatory Visit: Payer: Self-pay | Admitting: Cardiology

## 2015-09-29 NOTE — Telephone Encounter (Signed)
Rx request sent to pharmacy.  

## 2015-10-08 ENCOUNTER — Other Ambulatory Visit: Payer: Self-pay | Admitting: Internal Medicine

## 2015-10-18 ENCOUNTER — Ambulatory Visit: Payer: BLUE CROSS/BLUE SHIELD | Admitting: Internal Medicine

## 2015-10-19 ENCOUNTER — Encounter: Payer: Self-pay | Admitting: Family

## 2015-10-19 ENCOUNTER — Ambulatory Visit (INDEPENDENT_AMBULATORY_CARE_PROVIDER_SITE_OTHER): Payer: BLUE CROSS/BLUE SHIELD | Admitting: Family

## 2015-10-19 VITALS — BP 138/82 | HR 69 | Temp 98.2°F | Ht 67.0 in | Wt 164.2 lb

## 2015-10-19 DIAGNOSIS — F329 Major depressive disorder, single episode, unspecified: Secondary | ICD-10-CM

## 2015-10-19 DIAGNOSIS — G8929 Other chronic pain: Secondary | ICD-10-CM | POA: Diagnosis not present

## 2015-10-19 DIAGNOSIS — T148XXA Other injury of unspecified body region, initial encounter: Secondary | ICD-10-CM

## 2015-10-19 DIAGNOSIS — M545 Low back pain: Secondary | ICD-10-CM | POA: Diagnosis not present

## 2015-10-19 DIAGNOSIS — F32A Depression, unspecified: Secondary | ICD-10-CM

## 2015-10-19 DIAGNOSIS — T148 Other injury of unspecified body region: Secondary | ICD-10-CM | POA: Diagnosis not present

## 2015-10-19 MED ORDER — DULOXETINE HCL 20 MG PO CPEP: ORAL_CAPSULE | ORAL | Status: DC

## 2015-10-19 NOTE — Patient Instructions (Signed)
So great to see you today; please let know if you need anything else.   F/u with PCP in the next 6 months.

## 2015-10-19 NOTE — Progress Notes (Signed)
Subjective:    Patient ID: Christine Garcia, female    DOB: 1960-09-17, 55 y.o.   MRN: [id number]   Christine Garcia is a 55 y.o. female who presents today for an acute visit.    HPI Comments: Patient is here for follow-up after starting medication for treatment of depression.I last saw patient 5/11 for depression, and we started trial of Cymbalta to help with neuropathy, anxiety, and depression. Mood has improved, she is still slightly anxious as she has a surgery coming up for amputation of 2 fingers. She states she has a very supportive husband who continues to support her financially and emotionally while she is on disability. She notes 2 small bruises on her arms. She denies any vaginal bleeding, bleeding from nose, gums. Denies thoughts of hurting herself or anyone else.  H/o dupuytren contracture. 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     Past Medical History  Diagnosis Date  . GERD (gastroesophageal reflux disease)   . Migraines     occasional  . Seasonal allergies   . History of atrial fibrillation     one occurrence - states K+ was low at the time  . Murmur     states no problems  . Hypertension     under control with med., has been on med. x 2 yr.  . High triglycerides   . Dupuytren's contracture of right hand 08/2014    ring/middle fingers  . Complication of anesthesia     states "is hard to numb"  . Anxiety    Allergies: Penicillins Current Outpatient Prescriptions on File Prior to Visit  Medication Sig Dispense Refill  . ALPRAZolam (XANAX) 0.5 MG tablet Take 0.5 mg by mouth daily as needed for anxiety.   0  . aspirin 81 MG tablet Take 81 mg by mouth daily.      . carvedilol (COREG) 6.25 MG tablet Take 1 tablet (6.25 mg total) by mouth 2 (two) times daily with a meal. Please keep your upcoming appointment (12/11/15) for refills. 60 tablet 3  . diclofenac sodium (VOLTAREN) 1 % GEL Apply 4 g topically 4 (four) times  daily. 1 Tube 3  . DULoxetine (CYMBALTA) 20 MG capsule Take 20 mg by mouth once a day for one week. Then increase to 20 mg twice a day and stay at that dose thereafter. 60 capsule 2  . ferrous sulfate 325 (65 FE) MG tablet Take 325 mg by mouth daily.      Marland Kitchen HYDROcodone-acetaminophen (NORCO) 7.5-325 MG tablet Take 1 tablet by mouth every 8 (eight) hours as needed. for pain  0  . ibuprofen (ADVIL,MOTRIN) 200 MG tablet Take 400 mg by mouth every 6 (six) hours as needed for moderate pain. Reported on 09/22/2015    . KLOR-CON M20 20 MEQ tablet TAKE ONE TABLET BY MOUTH TWICE DAILY 180 tablet 2  . Lansoprazole (PREVACID 24HR PO) Take 1 capsule by mouth daily.      . Omega-3 1000 MG CAPS Take 1 g by mouth daily.     . pregabalin (LYRICA) 100 MG capsule Take 100 mg by mouth 2 (two) times daily.    Marland Kitchen tiZANidine (ZANAFLEX) 4 MG tablet Take 4 mg by mouth 2 (two) times daily.    Marland Kitchen triamterene-hydrochlorothiazide (MAXZIDE-25) 37.5-25 MG per tablet TAKE ONE TABLET BY MOUTH ONCE DAILY. 30 tablet 10  . Vitamin D, Ergocalciferol, (DRISDOL) 50000 units CAPS capsule Take 50,000 Units by mouth every 7 (  seven) days.   2   No current facility-administered medications on file prior to visit.    Social History  Substance Use Topics  . Smoking status: Never Smoker   . Smokeless tobacco: Never Used  . Alcohol Use: 0.0 oz/week    0 Standard drinks or equivalent per week     Comment: 3-4 beers daily    Review of Systems  Constitutional: Negative for fever and chills.  Respiratory: Negative for cough.   Cardiovascular: Negative for chest pain and palpitations.  Gastrointestinal: Negative for nausea and vomiting.  Hematological: Does not bruise/bleed easily.  Psychiatric/Behavioral: Negative for self-injury.      Objective:    BP 138/82 mmHg  Pulse 69  Temp(Src) 98.2 F (36.8 C) (Oral)  Ht 5\' 7"  (1.702 m)  Wt 164 lb 4 oz (74.503 kg)  BMI 25.72 kg/m2  SpO2 98%  LMP 09/15/2011   Physical Exam    Constitutional: She appears well-developed and well-nourished.  Eyes: Conjunctivae are normal.  Cardiovascular: Normal rate, regular rhythm, normal heart sounds and normal pulses.   Pulmonary/Chest: Effort normal and breath sounds normal. She has no wheezes. She has no rhonchi. She has no rales.  Musculoskeletal:       Arms: 2 small bruises noted bilateral arms as noted on diagram.  Neurological: She is alert.  Skin: Skin is warm and dry.  Psychiatric: She has a normal mood and affect. Her speech is normal and behavior is normal. Thought content normal.  Vitals reviewed.      Assessment & Plan:   1. Depression I am so pleased to see that the patient is doing better. She is such a delightful woman however has a lot on her plate right now. Refilled her medication and advised her to continue following her PCP so he could take over refills of this medication.  - DULoxetine (CYMBALTA) 20 MG capsule; Take 20 mg by mouth once a day for one week. Then increase to 20 mg twice a day and stay at that dose thereafter.  Dispense: 60 capsule; Refill: 4  2. Chronic low back pain Stable.  - DULoxetine (CYMBALTA) 20 MG capsule; Take 20 mg by mouth once a day for one week. Then increase to 20 mg twice a day and stay at that dose thereafter.  Dispense: 60 capsule; Refill: 4  3. Bruising Reassured the patient is not having any at the nasal bleeding or bleeding from gums or other orifices. I suspect the small bruises are merely coincidence. However I will check a CBC. - CBC with Differential/Platelet; Future    I am having Ms. Tatum maintain her aspirin, ferrous sulfate, Lansoprazole (PREVACID 24HR PO), ibuprofen, triamterene-hydrochlorothiazide, KLOR-CON M20, pregabalin, tiZANidine, HYDROcodone-acetaminophen, Omega-3, Vitamin D (Ergocalciferol), ALPRAZolam, diclofenac sodium, DULoxetine, and carvedilol.   No orders of the defined types were placed in this encounter.     Start medications as  prescribed and explained to patient on After Visit Summary ( AVS). Risks, benefits, and alternatives of the medications and treatment plan prescribed today were discussed, and patient expressed understanding.   Education regarding symptom management and diagnosis given to patient.   Follow-up:Plan follow-up and return precautions given if any worsening symptoms or change in condition.   Continue to follow with Sanda Lingerhomas Jones, MD for routine health maintenance.   Adela PortsBetty J Shvartsman and I agreed with plan.   Rennie PlowmanMargaret Arnett, FNP

## 2015-10-19 NOTE — Progress Notes (Signed)
Pre visit review using our clinic review tool, if applicable. No additional management support is needed unless otherwise documented below in the visit note. 

## 2015-11-11 ENCOUNTER — Other Ambulatory Visit (INDEPENDENT_AMBULATORY_CARE_PROVIDER_SITE_OTHER): Payer: BLUE CROSS/BLUE SHIELD

## 2015-11-11 DIAGNOSIS — T148 Other injury of unspecified body region: Secondary | ICD-10-CM

## 2015-11-11 DIAGNOSIS — T148XXA Other injury of unspecified body region, initial encounter: Secondary | ICD-10-CM

## 2015-11-11 LAB — CBC WITH DIFFERENTIAL/PLATELET
BASOS PCT: 0.6 % (ref 0.0–3.0)
Basophils Absolute: 0.1 10*3/uL (ref 0.0–0.1)
EOS PCT: 0.2 % (ref 0.0–5.0)
Eosinophils Absolute: 0 10*3/uL (ref 0.0–0.7)
HEMATOCRIT: 44.2 % (ref 36.0–46.0)
HEMOGLOBIN: 15.4 g/dL — AB (ref 12.0–15.0)
LYMPHS PCT: 9 % — AB (ref 12.0–46.0)
Lymphs Abs: 1 10*3/uL (ref 0.7–4.0)
MCHC: 34.7 g/dL (ref 30.0–36.0)
MCV: 93.7 fl (ref 78.0–100.0)
Monocytes Absolute: 0.9 10*3/uL (ref 0.1–1.0)
Monocytes Relative: 8.3 % (ref 3.0–12.0)
Neutro Abs: 9 10*3/uL — ABNORMAL HIGH (ref 1.4–7.7)
Neutrophils Relative %: 81.9 % — ABNORMAL HIGH (ref 43.0–77.0)
PLATELETS: 270 10*3/uL (ref 150.0–400.0)
RBC: 4.73 Mil/uL (ref 3.87–5.11)
RDW: 13.3 % (ref 11.5–15.5)
WBC: 11 10*3/uL — AB (ref 4.0–10.5)

## 2015-12-28 ENCOUNTER — Other Ambulatory Visit: Payer: Self-pay | Admitting: Cardiology

## 2015-12-28 NOTE — Telephone Encounter (Signed)
Rollene RotundaJames Hochrein, MD at 12/18/2013 4:40 PM  triamterene-hydrochlorothiazide (MAXZIDE-25) 37.5-25 MG per tablet TAKE ONE TABLET BY MOUTH ONCE DAILY   Patient Instructions   Your physician recommends that you schedule a follow-up appointment in: 2 years with Dr. Antoine PocheHochrein

## 2016-01-06 ENCOUNTER — Encounter: Payer: Self-pay | Admitting: Cardiology

## 2016-01-19 NOTE — Progress Notes (Signed)
HPI The patient presents for evaluation of chest discomfort. I saw her a few years ago in 2015.  She has had minimal coronary disease. In 2013 she had a brief episode of atrial fibrillation. She presents for follow-up visits been a couple of years. She had a catheterization 2012 this demonstrated 25% LAD stenosis and 30% proximal right coronary stenosis. She unfortunately has had a lot of orthopedic problems. She is injured shoulder. She has some back problems. 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 She is limited in her activities because of all of things that she does mild household chores. With this she does not get shortness of breath. She's not describing PND or orthopnea. She's not having any palpitations, presyncope or syncope. She's had no chest pressure, neck or arm discomfort. She's had no weight gain or edema.  Allergies  Allergen Reactions  . Penicillins Hives    Current Outpatient Prescriptions  Medication Sig Dispense Refill  . ALPRAZolam (XANAX) 0.5 MG tablet Take 0.5 mg by mouth daily as needed for anxiety.   0  . aspirin 81 MG tablet Take 81 mg by mouth daily.      . carvedilol (COREG) 6.25 MG tablet Take 1 tablet (6.25 mg total) by mouth 2 (two) times daily with a meal. Please keep your upcoming appointment (12/11/15) for refills. 60 tablet 3  . diclofenac sodium (VOLTAREN) 1 % GEL Apply 4 g topically 4 (four) times daily. 1 Tube 3  . DULoxetine (CYMBALTA) 20 MG capsule Take 20 mg by mouth once a day for one week. Then increase to 20 mg twice a day and stay at that dose thereafter. 60 capsule 4  . ferrous sulfate 325 (65 FE) MG tablet Take 325 mg by mouth daily.      Marland Kitchen HYDROcodone-acetaminophen (NORCO) 7.5-325 MG tablet Take 1 tablet by mouth every 8 (eight) hours as needed. for pain  0  . KLOR-CON M20 20 MEQ tablet TAKE ONE TABLET BY MOUTH TWICE DAILY 180  tablet 2  . Lansoprazole (PREVACID 24HR PO) Take 1 capsule by mouth daily.      . Omega-3 1000 MG CAPS Take 1 g by mouth daily.     . pregabalin (LYRICA) 100 MG capsule Take 100 mg by mouth 2 (two) times daily.    Marland Kitchen tiZANidine (ZANAFLEX) 4 MG tablet Take 4 mg by mouth 2 (two) times daily.    Marland Kitchen triamterene-hydrochlorothiazide (MAXZIDE-25) 37.5-25 MG tablet TAKE 1 TABLET BY MOUTH EVERY DAY 30 tablet 0  . Vitamin D, Ergocalciferol, (DRISDOL) 50000 units CAPS capsule Take 50,000 Units by mouth every 7 (seven) days.   2   No current facility-administered medications for this visit.     Past Medical History:  Diagnosis Date  . Anxiety   . Complication of anesthesia    states "is hard to numb"  . Dupuytren's contracture of right hand 08/2014   ring/middle fingers  . GERD (gastroesophageal reflux disease)   . High triglycerides   . History of atrial fibrillation    one occurrence - states K+ was low at the time  . Hypertension    under control with med., has been on med. x 2 yr.  . Migraines    occasional  . Murmur    states no problems  . Seasonal allergies     Past Surgical History:  Procedure Laterality Date  .  CARDIAC CATHETERIZATION  07/14/2010   "nonobstructive CAD"  . DILATION AND CURETTAGE OF UTERUS  2002  . FASCIECTOMY Right 09/02/2014   Procedure: RIGHT HAND FASCIECTOMY RIGHT RING AND RIGHT MIDDLE FINGER ;  Surgeon: Cindee SaltGary Kuzma, MD;  Location: [facility];  Service: Orthopedics;  Laterality: Right;  . ORIF HUMERUS FRACTURE Left 01/13/2015   Procedure: OPEN REDUCTION INTERNAL FIXATION (ORIF) LEFT PROXIMAL HUMERUS FRACTURE;  Surgeon: Francena HanlyKevin Supple, MD;  Location: [facility];  Service: Orthopedics;  Laterality: Left;    ROS:   As stated in the HPI and negative for all other systems.  PHYSICAL EXAM BP (!) 148/94   Pulse 74   Ht 5\' 7"  (1.702 m)   Wt 167 lb 12.8 oz (76.1 kg)   LMP 09/15/2011   BMI 26.28 kg/m  GENERAL:  Well appearing NECK:  No jugular venous  distention, waveform within normal limits, carotid upstroke brisk and symmetric, no bruits, no thyromegaly LUNGS:  Clear to auscultation bilaterally BACK:  No CVA tenderness CHEST:  Unremarkable HEART:  PMI not displaced or sustained,S1 and S2 within normal limits, no S3, no S4, no clicks, no rubs, no murmurs ABD:  Flat, positive bowel sounds normal in frequency in pitch, no bruits, no rebound, no guarding, no midline pulsatile mass, no hepatomegaly, no splenomegaly EXT:  2 plus pulses throughout, no edema, no cyanosis no clubbing,  contractures on both hands  EKG:  Sinus rhythm, rate 74, premature atrial contractions, poor anterior R wave progression, no acute ST-T wave changes. 01/20/2016  ASSESSMENT AND PLAN  ATRIAL FIB:  [name]. No further change in therapy is indicated.  CAD:  The patient did have nonobstructive coronary disease before. She has a somewhat limited functional activity. She's going to have surgery sounds like a significant surgery on her hand. I like to screen her prior to this with rest testing. With her orthopedic problem she might have trouble but she would consent to trying to do a POET (Plain Old Exercise Treadmill) .  She is going to call us back to discuss the timing of this surgery is going to be. Like to have Ali Molina as she is now living in [place].  HTN:  The blood pressure slightly elevated but this is unusual. No change in therapy is indicated.  She will keep a blood pressure diary.

## 2016-01-20 ENCOUNTER — Encounter: Payer: Self-pay | Admitting: Cardiology

## 2016-01-20 ENCOUNTER — Ambulatory Visit (INDEPENDENT_AMBULATORY_CARE_PROVIDER_SITE_OTHER): Payer: BLUE CROSS/BLUE SHIELD | Admitting: Cardiology

## 2016-01-20 VITALS — BP 148/94 | HR 74 | Ht 67.0 in | Wt 167.8 lb

## 2016-01-20 DIAGNOSIS — I2510 Atherosclerotic heart disease of native coronary artery without angina pectoris: Secondary | ICD-10-CM

## 2016-01-20 NOTE — Patient Instructions (Signed)
Your physician recommends that you schedule a follow-up appointment in: As Needed  Patient will call our office back as to when she will get an Exercise Tolerence Stress Test done

## 2016-01-30 ENCOUNTER — Other Ambulatory Visit: Payer: Self-pay | Admitting: Cardiology

## 2016-02-07 ENCOUNTER — Other Ambulatory Visit: Payer: Self-pay | Admitting: Cardiology

## 2016-03-25 ENCOUNTER — Other Ambulatory Visit: Payer: Self-pay | Admitting: Cardiology

## 2016-04-03 ENCOUNTER — Other Ambulatory Visit: Payer: Self-pay | Admitting: Family

## 2016-04-03 ENCOUNTER — Telehealth: Payer: Self-pay | Admitting: Family

## 2016-04-03 DIAGNOSIS — F3342 Major depressive disorder, recurrent, in full remission: Secondary | ICD-10-CM

## 2016-04-03 MED ORDER — DULOXETINE HCL 20 MG PO CPEP: ORAL_CAPSULE | ORAL | 0 refills | Status: DC

## 2016-04-03 NOTE — Telephone Encounter (Signed)
Call pt-  I refilled for one month supply cymbalta for depression.   She needs to follow with her PCP for continued refills  Advise f/u appt with pcp.

## 2016-04-03 NOTE — Telephone Encounter (Signed)
Patient has been informed.

## 2016-05-03 ENCOUNTER — Other Ambulatory Visit: Payer: Self-pay | Admitting: Family

## 2016-05-03 DIAGNOSIS — F3342 Major depressive disorder, recurrent, in full remission: Secondary | ICD-10-CM

## 2016-05-10 ENCOUNTER — Other Ambulatory Visit: Payer: Self-pay

## 2016-05-10 DIAGNOSIS — F3342 Major depressive disorder, recurrent, in full remission: Secondary | ICD-10-CM

## 2016-06-25 ENCOUNTER — Other Ambulatory Visit: Payer: Self-pay | Admitting: *Deleted

## 2016-06-25 MED ORDER — TRIAMTERENE-HCTZ 37.5-25 MG PO TABS: 1.0000 | ORAL_TABLET | Freq: Every day | ORAL | 3 refills | Status: DC

## 2016-07-23 ENCOUNTER — Other Ambulatory Visit: Payer: Self-pay

## 2016-07-23 MED ORDER — TRIAMTERENE-HCTZ 37.5-25 MG PO TABS: 1.0000 | ORAL_TABLET | Freq: Every day | ORAL | 0 refills | Status: DC

## 2016-07-27 ENCOUNTER — Other Ambulatory Visit: Payer: Self-pay

## 2016-07-27 MED ORDER — CARVEDILOL 6.25 MG PO TABS: 6.2500 mg | ORAL_TABLET | Freq: Two times a day (BID) | ORAL | 1 refills | Status: DC

## 2016-08-14 DIAGNOSIS — J309 Allergic rhinitis, unspecified: Secondary | ICD-10-CM | POA: Insufficient documentation

## 2016-08-14 DIAGNOSIS — G629 Polyneuropathy, unspecified: Secondary | ICD-10-CM | POA: Insufficient documentation

## 2016-08-14 DIAGNOSIS — M5412 Radiculopathy, cervical region: Secondary | ICD-10-CM | POA: Insufficient documentation

## 2016-08-14 DIAGNOSIS — G5600 Carpal tunnel syndrome, unspecified upper limb: Secondary | ICD-10-CM | POA: Insufficient documentation

## 2016-08-14 DIAGNOSIS — M5126 Other intervertebral disc displacement, lumbar region: Secondary | ICD-10-CM | POA: Insufficient documentation

## 2016-09-11 ENCOUNTER — Other Ambulatory Visit: Payer: Self-pay | Admitting: *Deleted

## 2016-09-11 MED ORDER — CARVEDILOL 6.25 MG PO TABS: 6.2500 mg | ORAL_TABLET | Freq: Two times a day (BID) | ORAL | 3 refills | Status: DC

## 2017-01-28 ENCOUNTER — Other Ambulatory Visit: Payer: Self-pay | Admitting: Cardiology

## 2017-03-19 ENCOUNTER — Other Ambulatory Visit: Payer: Self-pay | Admitting: Cardiology

## 2017-03-19 NOTE — Telephone Encounter (Signed)
REFILL 

## 2017-04-02 ENCOUNTER — Telehealth: Payer: Self-pay | Admitting: Cardiology

## 2017-04-02 NOTE — Telephone Encounter (Signed)
SPOKE WITH patient . She states neurologist has prescribed prednisone 21 day pak  because increasing back pain. Patient wanted to know if prednisone is okay to use with blood pressure medications.   Patient last visit with Dr Antoine Pochehochrein was 01/2016 - follow up was "prn"  Patient aware will defer to [facility] PHARMACIST for recommendation and Dr Antoine Pochehochrein

## 2017-04-02 NOTE — Telephone Encounter (Signed)
Spoke to patient. Information given verbalized understanding. She states she will start taking it tomorrow.

## 2017-04-02 NOTE — Telephone Encounter (Signed)
New message     Patient wants to know if Prednisone is safe to take with blood pressure medications.  Pt c/o medication issue:  1. Name of Medication: Prednisone  2. How are you currently taking this medication (dosage and times per day)? 21 days  3. Are you having a reaction (difficulty breathing--STAT)? NO  4. What is your medication issue? Is medication safe to take with blood pressure medication

## 2017-04-02 NOTE — Telephone Encounter (Signed)
Prednisone may cause an increase in blood pressure but should resolve shortly after discontinuation of medication.   Please take prednisone as as prescribed, monitor BP at home, and call if BP above 150/90

## 2017-04-03 NOTE — Telephone Encounter (Signed)
Agree 
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
## 2017-06-17 ENCOUNTER — Other Ambulatory Visit: Payer: Self-pay | Admitting: Cardiology

## 2017-06-17 NOTE — Telephone Encounter (Signed)
Rx request sent to pharmacy.  

## 2017-06-21 ENCOUNTER — Encounter: Payer: Self-pay | Admitting: *Deleted

## 2017-06-21 NOTE — Telephone Encounter (Signed)
   [facility] Pre-operative Risk Assessment    Request for surgical clearance:  1.    Deliah GoodyDebra Mathis 06/21/2017, 5:47 PM  _________________________________________________________________   (provider comments below)    This encounter was created in error - please disregard.

## 2017-06-26 ENCOUNTER — Telehealth: Payer: Self-pay | Admitting: *Deleted

## 2017-06-26 NOTE — Telephone Encounter (Signed)
   [facility] Pre-operative Risk Assessment    Request for CLEARANCE FOR DENTAL TREATMENT:  1. What type of clearance is required (medical clearance vs. Pharmacy clearance to hold med vs. Both)? MEDICAL CLEARANCE TO PROCEED WITH DENTAL TREATMENT WHICH COULD INVOLVE BLEEDING, USE OF LOCAL ANESTHETICS, AND/OR STRESS  2. Are there any medications that need to be held prior to surgery and how long?  3. Any premeds needed?  4. Practice name and name of physician performing surgery? [facility]   5. What is your office phone and fax number? PHONE [phone] FAX [phone]

## 2017-06-27 NOTE — Telephone Encounter (Signed)
   Primary Cardiologist: Rollene RotundaJames Hochrein, MD  Chart reviewed as part of pre-operative protocol coverage. Given past medical history and time since last visit, based on ACC/AHA guidelines, Christine PortsBetty J Garcia would be at acceptable risk for the planned procedure without further cardiovascular testing. She is able do > 4 mets of activity.   Dr. Antoine PocheHochrein, [facility] to [facility] and how long? Please forward your response to P [facility] DIV PREOP.   KankakeeBhavinkumar Kaiyon Garcia, [place] 06/27/2017, 3:04 PM

## 2017-06-27 NOTE — Telephone Encounter (Signed)
UNABLE TO LEAVE MESSAGE FOR PT TO CALL BACK MAILBOX HAS NOT BEEN SET UP AND MAILBOX IS FULL ON HUSBAND'S NUMBER . WILL TRY LATER .Zack Seal/CY

## 2017-06-30 NOTE — Telephone Encounter (Signed)
OK to hold ASA for five days prior to surgery.

## 2017-07-01 NOTE — Telephone Encounter (Signed)
   Primary Cardiologist:James Hochrein, MD  Chart reviewed as part of pre-operative protocol coverage. Pre-op clearance already addressed by colleagues in earlier phone notes. To summarize recommendations:  - Christine Bhagat PA-C spoke with patient and feels that based on ACC/AHA guidelines, Christine Garcia would be at acceptable risk for the planned procedure without further cardiovascular testing.  - Dr. Antoine Garcia states she is OK to hold ASA for 5 days prior to surgery (if patient is to have dental surgery where holding of aspirin is required).  Will route this bundled recommendation to requesting provider via Epic fax function. Please call with questions.  Christine Montanaayna N Damon Baisch, PA-C 07/01/2017, 12:56 PM

## 2017-09-18 ENCOUNTER — Other Ambulatory Visit: Payer: Self-pay | Admitting: Cardiology

## 2017-09-18 NOTE — Telephone Encounter (Signed)
triameterene-hctz refill refused - patient sees MD on PRN basis and last visit >1 year ago

## 2017-09-19 ENCOUNTER — Other Ambulatory Visit: Payer: Self-pay | Admitting: Cardiology

## 2017-10-18 ENCOUNTER — Other Ambulatory Visit: Payer: Self-pay | Admitting: Cardiology

## 2017-11-12 ENCOUNTER — Other Ambulatory Visit: Payer: Self-pay | Admitting: Cardiology

## 2017-12-09 DIAGNOSIS — S32020A Wedge compression fracture of second lumbar vertebra, initial encounter for closed fracture: Secondary | ICD-10-CM | POA: Insufficient documentation

## 2018-01-02 DIAGNOSIS — M51369 Other intervertebral disc degeneration, lumbar region without mention of lumbar back pain or lower extremity pain: Secondary | ICD-10-CM | POA: Insufficient documentation

## 2018-09-03 DIAGNOSIS — G8929 Other chronic pain: Secondary | ICD-10-CM | POA: Insufficient documentation

## 2019-09-28 DIAGNOSIS — R569 Unspecified convulsions: Secondary | ICD-10-CM | POA: Insufficient documentation

## 2019-10-13 DIAGNOSIS — E222 Syndrome of inappropriate secretion of antidiuretic hormone: Secondary | ICD-10-CM | POA: Insufficient documentation

## 2019-10-13 DIAGNOSIS — I4891 Unspecified atrial fibrillation: Secondary | ICD-10-CM | POA: Insufficient documentation

## 2019-11-04 DIAGNOSIS — E559 Vitamin D deficiency, unspecified: Secondary | ICD-10-CM | POA: Insufficient documentation

## 2019-11-05 DIAGNOSIS — M47819 Spondylosis without myelopathy or radiculopathy, site unspecified: Secondary | ICD-10-CM | POA: Insufficient documentation

## 2019-12-22 DIAGNOSIS — M810 Age-related osteoporosis without current pathological fracture: Secondary | ICD-10-CM | POA: Insufficient documentation

## 2022-07-23 NOTE — Progress Notes (Deleted)
I,Sha'taria Jarett Dralle,acting as a Education administrator for [employer], PA-C.,have documented all relevant documentation on the behalf of Mardene Speak, PA-C,as directed by  [employer], PA-C while in the presence of [employer], PA-C.   New patient visit   Patient: Christine Garcia   DOB: 1960/10/27   62 y.o. Female  MRN: [id number] Visit Date: 08/13/2022  Today's healthcare provider: Mardene Speak, PA-C   No chief complaint on file.  Subjective    Christine Garcia is a 62 y.o. female who presents today as a new patient to establish care.  HPI  ***  Past Medical History:  Diagnosis Date  . Anxiety   . Complication of anesthesia    states "is hard to numb"  . Dupuytren's contracture of right hand 08/2014   ring/middle fingers  . GERD (gastroesophageal reflux disease)   . High triglycerides   . History of atrial fibrillation    one occurrence - states K+ was low at the time  . Hypertension    under control with med., has been on med. x 2 yr.  . Migraines    occasional  . Murmur    states no problems  . Seasonal allergies    Past Surgical History:  Procedure Laterality Date  . CARDIAC CATHETERIZATION  07/14/2010   "nonobstructive CAD"  . DILATION AND CURETTAGE OF UTERUS  2002  . FASCIECTOMY Right 09/02/2014   Procedure: RIGHT HAND FASCIECTOMY RIGHT RING AND RIGHT MIDDLE FINGER ;  Surgeon: Daryll Brod, MD;  Location: [facility];  Service: Orthopedics;  Laterality: Right;  . ORIF HUMERUS FRACTURE Left 01/13/2015   Procedure: OPEN REDUCTION INTERNAL FIXATION (ORIF) LEFT PROXIMAL HUMERUS FRACTURE;  Surgeon: Justice Britain, MD;  Location: [facility];  Service: Orthopedics;  Laterality: Left;   Family Status  Relation Name Status  . Father  Deceased at age 62  . Mother  Deceased at age 67       CVA / MI  . Sister  Alive       MI  . Sister  (Not Specified)   Family History  Problem Relation Age of Onset  . Congestive Heart Failure Father   . COPD Father   . CVA Father    . Stroke Mother   . Heart attack Mother   . Heart attack Sister 34   Social History   Socioeconomic History  . Marital status: Married    Spouse name: Not on file  . Number of children: 1  . Years of education: Not on file  . Highest education level: Not on file  Occupational History  . Occupation: Metallurgist: [employer]  Tobacco Use  . Smoking status: Never  . Smokeless tobacco: Never  Substance and Sexual Activity  . Alcohol use: Yes    Alcohol/week: 0.0 standard drinks of alcohol    Comment: 3-4 beers daily  . Drug use: No  . Sexual activity: Yes  Other Topics Concern  . Not on file  Social History Narrative  . Not on file   Social Determinants of Health   Financial Resource Strain: Not on file  Food Insecurity: Not on file  Transportation Needs: Not on file  Physical Activity: Not on file  Stress: Not on file  Social Connections: Not on file   Outpatient Medications Prior to Visit  Medication Sig  . ALPRAZolam (XANAX) 0.5 MG tablet Take 0.5 mg by mouth daily as needed for anxiety.   Marland Kitchen aspirin 81 MG tablet  Take 81 mg by mouth daily.    . carvedilol (COREG) 6.25 MG tablet Take 1 tablet (6.25 mg total) by mouth 2 (two) times daily with a meal. Patient needs to schedule an appointment for refills  . diclofenac sodium (VOLTAREN) 1 % GEL Apply 4 g topically 4 (four) times daily.  . DULoxetine (CYMBALTA) 20 MG capsule Take 20 mg by mouth once a day for one week. Then increase to 20 mg twice a day and stay at that dose thereafter.  . ferrous sulfate 325 (65 FE) MG tablet Take 325 mg by mouth daily.    Marland Kitchen HYDROcodone-acetaminophen (NORCO) 7.5-325 MG tablet Take 1 tablet by mouth every 8 (eight) hours as needed. for pain  . KLOR-CON M20 20 MEQ tablet TAKE 1 TABLET BY MOUTH 2 TIMES A DAY  . Lansoprazole (PREVACID 24HR PO) Take 1 capsule by mouth daily.    . Omega-3 1000 MG CAPS Take 1 g by mouth daily.   . pregabalin (LYRICA) 100 MG capsule Take 100 mg by mouth 2 (two)  times daily.  Marland Kitchen tiZANidine (ZANAFLEX) 4 MG tablet Take 4 mg by mouth 2 (two) times daily.  Marland Kitchen triamterene-hydrochlorothiazide (MAXZIDE-25) 37.5-25 MG tablet Take 1 tablet by mouth daily.  . Vitamin D, Ergocalciferol, (DRISDOL) 50000 units CAPS capsule Take 50,000 Units by mouth every 7 (seven) days.    No facility-administered medications prior to visit.   Allergies  Allergen Reactions  . Penicillins Hives    Immunization History  Administered Date(s) Administered  . Influenza,inj,Quad PF,6+ Mos 02/01/2014  . Moderna Covid-19 Vaccine Bivalent Booster 26yr & up 05/02/2021  . Moderna Sars-Covid-2 Vaccination 07/28/2019, 08/25/2019  . Td 05/14/2005  . Tdap 02/01/2014    Health Maintenance  Topic Date Due  . HIV Screening  Never done  . PAP SMEAR-Modifier  Never done  . COLONOSCOPY (Pts 45-418yrInsurance coverage will need to be confirmed)  Never done  . MAMMOGRAM  Never done  . Zoster Vaccines- Shingrix (1 of 2) Never done  . INFLUENZA VACCINE  12/12/2021  . COVID-19 Vaccine (4 - 2023-24 season) 01/12/2022  . DTaP/Tdap/Td (3 - Td or Tdap) 02/02/2024  . Hepatitis C Screening  Completed  . HPV VACCINES  Aged Out    Patient Care Team: HoMinus BreedingMD as PCP - Cardiology (Cardiology)  Review of Systems  {Labs  Heme  Chem  Endocrine  Serology  Results Review (optional):23779}   Objective    LMP 09/15/2011  {Show previous vital signs (optional):23777}  Physical Exam ***  Depression Screen    04/19/2015    2:48 PM  PHQ 2/9 Scores  PHQ - 2 Score 3  PHQ- 9 Score 13   No results found for any visits on 08/13/22.  Assessment & Plan     ***  No follow-ups on file.     {provider attestation***:1}   JaMardene SpeakPA-C  [phone]) [phone])  [place]

## 2022-08-13 ENCOUNTER — Ambulatory Visit: Payer: Self-pay | Admitting: Physician Assistant

## 2022-10-07 DIAGNOSIS — S42301A Unspecified fracture of shaft of humerus, right arm, initial encounter for closed fracture: Secondary | ICD-10-CM | POA: Diagnosis not present

## 2022-10-07 DIAGNOSIS — W109XXA Fall (on) (from) unspecified stairs and steps, initial encounter: Secondary | ICD-10-CM | POA: Diagnosis not present

## 2022-10-07 DIAGNOSIS — S8991XA Unspecified injury of right lower leg, initial encounter: Secondary | ICD-10-CM | POA: Diagnosis not present

## 2022-10-07 DIAGNOSIS — S42351A Displaced comminuted fracture of shaft of humerus, right arm, initial encounter for closed fracture: Secondary | ICD-10-CM | POA: Diagnosis not present

## 2022-10-11 DIAGNOSIS — M899 Disorder of bone, unspecified: Secondary | ICD-10-CM | POA: Diagnosis not present

## 2022-10-11 DIAGNOSIS — S42324A Nondisplaced transverse fracture of shaft of humerus, right arm, initial encounter for closed fracture: Secondary | ICD-10-CM | POA: Diagnosis not present

## 2022-12-06 DIAGNOSIS — E785 Hyperlipidemia, unspecified: Secondary | ICD-10-CM | POA: Diagnosis not present

## 2022-12-06 DIAGNOSIS — I10 Essential (primary) hypertension: Secondary | ICD-10-CM | POA: Diagnosis not present

## 2022-12-06 DIAGNOSIS — G40909 Epilepsy, unspecified, not intractable, without status epilepticus: Secondary | ICD-10-CM | POA: Diagnosis not present

## 2022-12-06 DIAGNOSIS — Z780 Asymptomatic menopausal state: Secondary | ICD-10-CM | POA: Diagnosis not present

## 2022-12-06 DIAGNOSIS — M5136 Other intervertebral disc degeneration, lumbar region: Secondary | ICD-10-CM | POA: Diagnosis not present

## 2022-12-06 DIAGNOSIS — M810 Age-related osteoporosis without current pathological fracture: Secondary | ICD-10-CM | POA: Diagnosis not present

## 2022-12-06 DIAGNOSIS — I482 Chronic atrial fibrillation, unspecified: Secondary | ICD-10-CM | POA: Diagnosis not present

## 2022-12-06 DIAGNOSIS — R7301 Impaired fasting glucose: Secondary | ICD-10-CM | POA: Diagnosis not present

## 2022-12-06 DIAGNOSIS — Z1211 Encounter for screening for malignant neoplasm of colon: Secondary | ICD-10-CM | POA: Diagnosis not present

## 2022-12-06 DIAGNOSIS — M519 Unspecified thoracic, thoracolumbar and lumbosacral intervertebral disc disorder: Secondary | ICD-10-CM | POA: Diagnosis not present

## 2022-12-06 DIAGNOSIS — F419 Anxiety disorder, unspecified: Secondary | ICD-10-CM | POA: Diagnosis not present

## 2022-12-06 DIAGNOSIS — G894 Chronic pain syndrome: Secondary | ICD-10-CM | POA: Diagnosis not present

## 2022-12-13 DIAGNOSIS — I10 Essential (primary) hypertension: Secondary | ICD-10-CM | POA: Diagnosis not present

## 2022-12-13 DIAGNOSIS — R7309 Other abnormal glucose: Secondary | ICD-10-CM | POA: Diagnosis not present

## 2022-12-13 DIAGNOSIS — E785 Hyperlipidemia, unspecified: Secondary | ICD-10-CM | POA: Diagnosis not present

## 2022-12-13 DIAGNOSIS — R7301 Impaired fasting glucose: Secondary | ICD-10-CM | POA: Diagnosis not present

## 2022-12-18 DIAGNOSIS — S42324D Nondisplaced transverse fracture of shaft of humerus, right arm, subsequent encounter for fracture with routine healing: Secondary | ICD-10-CM | POA: Diagnosis not present

## 2022-12-18 DIAGNOSIS — M899 Disorder of bone, unspecified: Secondary | ICD-10-CM | POA: Diagnosis not present

## 2023-01-03 DIAGNOSIS — S42324D Nondisplaced transverse fracture of shaft of humerus, right arm, subsequent encounter for fracture with routine healing: Secondary | ICD-10-CM | POA: Diagnosis not present

## 2023-01-08 DIAGNOSIS — S42324K Nondisplaced transverse fracture of shaft of humerus, right arm, subsequent encounter for fracture with nonunion: Secondary | ICD-10-CM | POA: Diagnosis not present

## 2023-01-17 DIAGNOSIS — E871 Hypo-osmolality and hyponatremia: Secondary | ICD-10-CM | POA: Diagnosis not present

## 2023-01-17 DIAGNOSIS — K029 Dental caries, unspecified: Secondary | ICD-10-CM | POA: Diagnosis not present

## 2023-01-17 DIAGNOSIS — G894 Chronic pain syndrome: Secondary | ICD-10-CM | POA: Diagnosis not present

## 2023-01-17 DIAGNOSIS — K219 Gastro-esophageal reflux disease without esophagitis: Secondary | ICD-10-CM | POA: Diagnosis not present

## 2023-01-17 DIAGNOSIS — S42324D Nondisplaced transverse fracture of shaft of humerus, right arm, subsequent encounter for fracture with routine healing: Secondary | ICD-10-CM | POA: Diagnosis not present

## 2023-01-17 DIAGNOSIS — E538 Deficiency of other specified B group vitamins: Secondary | ICD-10-CM | POA: Diagnosis not present

## 2023-01-17 DIAGNOSIS — Z01818 Encounter for other preprocedural examination: Secondary | ICD-10-CM | POA: Diagnosis not present

## 2023-01-17 DIAGNOSIS — I4891 Unspecified atrial fibrillation: Secondary | ICD-10-CM | POA: Diagnosis not present

## 2023-01-17 DIAGNOSIS — L209 Atopic dermatitis, unspecified: Secondary | ICD-10-CM | POA: Diagnosis not present

## 2023-01-18 DIAGNOSIS — E871 Hypo-osmolality and hyponatremia: Secondary | ICD-10-CM | POA: Diagnosis not present

## 2023-01-21 DIAGNOSIS — I482 Chronic atrial fibrillation, unspecified: Secondary | ICD-10-CM | POA: Diagnosis not present

## 2023-01-21 DIAGNOSIS — F419 Anxiety disorder, unspecified: Secondary | ICD-10-CM | POA: Diagnosis not present

## 2023-01-21 DIAGNOSIS — I10 Essential (primary) hypertension: Secondary | ICD-10-CM | POA: Diagnosis not present

## 2023-01-28 DIAGNOSIS — M79672 Pain in left foot: Secondary | ICD-10-CM | POA: Diagnosis not present

## 2023-01-29 DIAGNOSIS — M79672 Pain in left foot: Secondary | ICD-10-CM | POA: Diagnosis not present

## 2023-01-29 DIAGNOSIS — S92355A Nondisplaced fracture of fifth metatarsal bone, left foot, initial encounter for closed fracture: Secondary | ICD-10-CM | POA: Diagnosis not present

## 2023-02-14 DIAGNOSIS — I4891 Unspecified atrial fibrillation: Secondary | ICD-10-CM | POA: Diagnosis not present

## 2023-02-14 DIAGNOSIS — R0602 Shortness of breath: Secondary | ICD-10-CM | POA: Diagnosis not present

## 2023-02-14 DIAGNOSIS — E785 Hyperlipidemia, unspecified: Secondary | ICD-10-CM | POA: Diagnosis not present

## 2023-02-16 DIAGNOSIS — I4891 Unspecified atrial fibrillation: Secondary | ICD-10-CM | POA: Diagnosis not present

## 2023-02-28 DIAGNOSIS — R7301 Impaired fasting glucose: Secondary | ICD-10-CM | POA: Diagnosis not present

## 2023-02-28 DIAGNOSIS — I10 Essential (primary) hypertension: Secondary | ICD-10-CM | POA: Diagnosis not present

## 2023-02-28 DIAGNOSIS — Z6379 Other stressful life events affecting family and household: Secondary | ICD-10-CM | POA: Diagnosis not present

## 2023-02-28 DIAGNOSIS — G40909 Epilepsy, unspecified, not intractable, without status epilepticus: Secondary | ICD-10-CM | POA: Diagnosis not present

## 2023-02-28 DIAGNOSIS — F1290 Cannabis use, unspecified, uncomplicated: Secondary | ICD-10-CM | POA: Diagnosis not present

## 2023-02-28 DIAGNOSIS — E785 Hyperlipidemia, unspecified: Secondary | ICD-10-CM | POA: Diagnosis not present

## 2023-02-28 DIAGNOSIS — M810 Age-related osteoporosis without current pathological fracture: Secondary | ICD-10-CM | POA: Diagnosis not present

## 2023-02-28 DIAGNOSIS — Z0001 Encounter for general adult medical examination with abnormal findings: Secondary | ICD-10-CM | POA: Diagnosis not present

## 2023-02-28 DIAGNOSIS — F5104 Psychophysiologic insomnia: Secondary | ICD-10-CM | POA: Diagnosis not present

## 2023-02-28 DIAGNOSIS — K056 Periodontal disease, unspecified: Secondary | ICD-10-CM | POA: Diagnosis not present

## 2023-02-28 DIAGNOSIS — K219 Gastro-esophageal reflux disease without esophagitis: Secondary | ICD-10-CM | POA: Diagnosis not present

## 2023-02-28 DIAGNOSIS — G894 Chronic pain syndrome: Secondary | ICD-10-CM | POA: Diagnosis not present

## 2023-03-11 DIAGNOSIS — Z136 Encounter for screening for cardiovascular disorders: Secondary | ICD-10-CM | POA: Diagnosis not present

## 2023-03-11 DIAGNOSIS — Z1211 Encounter for screening for malignant neoplasm of colon: Secondary | ICD-10-CM | POA: Diagnosis not present

## 2023-04-04 DIAGNOSIS — R0602 Shortness of breath: Secondary | ICD-10-CM | POA: Diagnosis not present

## 2023-04-08 DIAGNOSIS — R0602 Shortness of breath: Secondary | ICD-10-CM | POA: Diagnosis not present

## 2023-04-24 DIAGNOSIS — K047 Periapical abscess without sinus: Secondary | ICD-10-CM | POA: Diagnosis not present

## 2023-05-02 DIAGNOSIS — I4891 Unspecified atrial fibrillation: Secondary | ICD-10-CM | POA: Diagnosis not present

## 2023-05-02 DIAGNOSIS — R0602 Shortness of breath: Secondary | ICD-10-CM | POA: Diagnosis not present

## 2023-05-02 DIAGNOSIS — E785 Hyperlipidemia, unspecified: Secondary | ICD-10-CM | POA: Diagnosis not present

## 2023-05-04 DIAGNOSIS — G894 Chronic pain syndrome: Secondary | ICD-10-CM | POA: Diagnosis not present

## 2023-05-04 DIAGNOSIS — F419 Anxiety disorder, unspecified: Secondary | ICD-10-CM | POA: Diagnosis not present

## 2023-05-30 DIAGNOSIS — K056 Periodontal disease, unspecified: Secondary | ICD-10-CM | POA: Diagnosis not present

## 2023-05-30 DIAGNOSIS — S42301A Unspecified fracture of shaft of humerus, right arm, initial encounter for closed fracture: Secondary | ICD-10-CM | POA: Diagnosis not present

## 2023-05-30 DIAGNOSIS — F419 Anxiety disorder, unspecified: Secondary | ICD-10-CM | POA: Diagnosis not present

## 2023-05-30 DIAGNOSIS — Z6379 Other stressful life events affecting family and household: Secondary | ICD-10-CM | POA: Diagnosis not present

## 2023-05-30 DIAGNOSIS — F1290 Cannabis use, unspecified, uncomplicated: Secondary | ICD-10-CM | POA: Diagnosis not present

## 2023-05-30 DIAGNOSIS — G40909 Epilepsy, unspecified, not intractable, without status epilepticus: Secondary | ICD-10-CM | POA: Diagnosis not present

## 2023-05-30 DIAGNOSIS — I4891 Unspecified atrial fibrillation: Secondary | ICD-10-CM | POA: Diagnosis not present

## 2023-07-31 ENCOUNTER — Encounter: Payer: Self-pay | Admitting: Family Medicine

## 2023-07-31 ENCOUNTER — Ambulatory Visit (INDEPENDENT_AMBULATORY_CARE_PROVIDER_SITE_OTHER): Payer: Self-pay | Admitting: Family Medicine

## 2023-07-31 VITALS — BP 100/79 | HR 81 | Ht 67.0 in | Wt 121.0 lb

## 2023-07-31 DIAGNOSIS — I10 Essential (primary) hypertension: Secondary | ICD-10-CM

## 2023-07-31 DIAGNOSIS — F5105 Insomnia due to other mental disorder: Secondary | ICD-10-CM

## 2023-07-31 DIAGNOSIS — K219 Gastro-esophageal reflux disease without esophagitis: Secondary | ICD-10-CM

## 2023-07-31 DIAGNOSIS — E042 Nontoxic multinodular goiter: Secondary | ICD-10-CM

## 2023-07-31 DIAGNOSIS — F409 Phobic anxiety disorder, unspecified: Secondary | ICD-10-CM

## 2023-07-31 DIAGNOSIS — E785 Hyperlipidemia, unspecified: Secondary | ICD-10-CM

## 2023-07-31 DIAGNOSIS — M5126 Other intervertebral disc displacement, lumbar region: Secondary | ICD-10-CM

## 2023-07-31 DIAGNOSIS — F418 Other specified anxiety disorders: Secondary | ICD-10-CM

## 2023-07-31 DIAGNOSIS — M51362 Other intervertebral disc degeneration, lumbar region with discogenic back pain and lower extremity pain: Secondary | ICD-10-CM

## 2023-07-31 DIAGNOSIS — I2510 Atherosclerotic heart disease of native coronary artery without angina pectoris: Secondary | ICD-10-CM

## 2023-07-31 DIAGNOSIS — M5412 Radiculopathy, cervical region: Secondary | ICD-10-CM

## 2023-07-31 DIAGNOSIS — G90511 Complex regional pain syndrome I of right upper limb: Secondary | ICD-10-CM

## 2023-07-31 DIAGNOSIS — Z87898 Personal history of other specified conditions: Secondary | ICD-10-CM

## 2023-07-31 DIAGNOSIS — E0789 Other specified disorders of thyroid: Secondary | ICD-10-CM

## 2023-07-31 DIAGNOSIS — G629 Polyneuropathy, unspecified: Secondary | ICD-10-CM

## 2023-07-31 DIAGNOSIS — M8080XA Other osteoporosis with current pathological fracture, unspecified site, initial encounter for fracture: Secondary | ICD-10-CM

## 2023-07-31 DIAGNOSIS — S99911A Unspecified injury of right ankle, initial encounter: Secondary | ICD-10-CM

## 2023-07-31 DIAGNOSIS — G40909 Epilepsy, unspecified, not intractable, without status epilepticus: Secondary | ICD-10-CM

## 2023-07-31 DIAGNOSIS — E781 Pure hyperglyceridemia: Secondary | ICD-10-CM

## 2023-07-31 DIAGNOSIS — R569 Unspecified convulsions: Secondary | ICD-10-CM

## 2023-07-31 DIAGNOSIS — Z1231 Encounter for screening mammogram for malignant neoplasm of breast: Secondary | ICD-10-CM

## 2023-07-31 DIAGNOSIS — M720 Palmar fascial fibromatosis [Dupuytren]: Secondary | ICD-10-CM

## 2023-07-31 DIAGNOSIS — S32020S Wedge compression fracture of second lumbar vertebra, sequela: Secondary | ICD-10-CM

## 2023-07-31 DIAGNOSIS — I482 Chronic atrial fibrillation, unspecified: Secondary | ICD-10-CM

## 2023-07-31 MED ORDER — METOPROLOL SUCCINATE ER 25 MG PO TB24: 25.0000 mg | ORAL_TABLET | Freq: Every day | ORAL | 3 refills | Status: AC

## 2023-07-31 MED ORDER — FOLIC ACID 1 MG PO TABS: 1.0000 mg | ORAL_TABLET | Freq: Every day | ORAL | 3 refills | Status: DC

## 2023-07-31 MED ORDER — GABAPENTIN 100 MG PO CAPS: 100.0000 mg | ORAL_CAPSULE | Freq: Every day | ORAL | 1 refills | Status: DC

## 2023-07-31 NOTE — Patient Instructions (Addendum)
 [facility]  [address] [place], [place] [address] Phone: [phone]  Someone will call for thyroid ultrasound   A referral has been placed on your behalf. Our referral coordination team or the office you will be visiting will contact you within the next 2 weeks.  If you have not received a phone call within 10 business days please let us know so that we can check into this for you.

## 2023-07-31 NOTE — Progress Notes (Unsigned)
 New patient visit   Patient: Christine Garcia   DOB: 1960-08-16   63 y.o. Female  MRN: [id number] Visit Date: 07/31/2023  Today's healthcare provider: Ronnald Ramp, MD   Chief Complaint  Patient presents with   Arm Injury    Larey Seat and broke her R arm, back in July. She was seen by an Ortho provider and was scheduled to have procedure done but couldn't move forward because she she was in A-fib. She then was seen by Cardiology whom scheduled some test but she could them because moved back to [place].     Foot Injury    She had a fall two days ago and now her R foot and ankle is bruised and swollen.    Subjective    Christine Garcia is a 63 y.o. female who presents today as a new patient to establish care.   HPI     Arm Injury    Additional comments: Larey Seat and broke her R arm, back in July. She was seen by an Ortho provider and was scheduled to have procedure done but couldn't move forward because she she was in A-fib. She then was seen by Cardiology whom scheduled some test but she could them because moved back to [place].          Foot Injury    Additional comments: She had a fall two days ago and now her R foot and ankle is bruised and swollen.       Last edited by Thedora Hinders, CMA on 07/31/2023 10:32 AM.       Discussed the use of AI scribe software for clinical note transcription with the patient, who gave verbal consent to proceed.  History of Present Illness   Christine Garcia is a 63 year old female who presents to establish care as a new patient. She is accompanied by her husband.  She has a history of alcohol use disorder and has been sober for 44 days. Her husband has been sober for a year. She uses medical marijuana for pain and anxiety management, which she finds helpful in reducing pain and anxiety levels.  She has a history of anxiety and depression, managed with duloxetine 30 mg twice daily and hydroxyzine 25 mg three times a day. She also takes  trazodone 50 mg at bedtime for insomnia related to anxiety. She experiences significant stress due to family issues, which may exacerbate symptoms.  She has chronic pain in the right upper extremity following a fracture. In July, she fell and was seen by an orthopedic provider in [place]. A procedure was scheduled but not completed due to atrial fibrillation. She was evaluated by cardiology but did not complete testing due to moving to [facility]. Her right arm injury has healed but causes pain due to something 'rubbing' under the skin, along with neuropathy pain.  She sustained a foot injury after a fall two days ago, resulting in bruising and swelling of the right foot. She uses ice to manage the swelling. She has difficulty walking due to neuropathy in her feet, which sometimes causes her feet not to move, leading to falls. She does not use a cane consistently.  She has a history of atrial fibrillation and is on Eliquis 5 mg twice daily and metoprolol once daily, though she does not recall the dose. She also takes Lasix 20 mg daily and pravastatin 20 mg daily.  She has a history of seizures, with the last episode occurring three  years ago, resulting in a coma. She takes Keppra 1000 mg twice daily and valproic acid 250 mg twice daily for seizure management. She reports a past incident where Narcan was administered during a seizure, which she believes worsened the seizure. No recent seizures.  She reports a history of back pain with a twisted spine and seven fractures, as well as degenerative disc disease and a compression fracture in the lumbar region. She has been receiving back injections for pain management in [place]. She also has Dupuytren's contracture in both hands, which limits finger function. She has a history of osteoporosis and arthritis, and she takes Tylenol 500 mg three times a day and tramadol 50 mg daily for pain. She also takes gabapentin 100 mg at night for neuropathy.  She has  a history of multiple thyroid nodules and has been diagnosed with syndrome of inappropriate antidiuretic hormone secretion, which causes increased thirst. She drinks a lot of water as a result.  She has a family history of neurological issues, though she is the only one with Dupuytren's contracture. Her brother is undergoing chemotherapy for stage three lung cancer.          Past Medical History:  Diagnosis Date   Anxiety    Complication of anesthesia    states "is hard to numb"   Dupuytren's contracture of right hand 08/2014   ring/middle fingers   GERD (gastroesophageal reflux disease)    High triglycerides    History of atrial fibrillation    one occurrence - states K+ was low at the time   Hypertension    under control with med., has been on med. x 2 yr.   Migraines    occasional   Murmur    states no problems   Seasonal allergies     Outpatient Medications Prior to Visit  Medication Sig   ALPRAZolam (XANAX) 0.5 MG tablet Take 0.5 mg by mouth daily as needed for anxiety.    aspirin 81 MG tablet Take 81 mg by mouth daily.     diclofenac sodium (VOLTAREN) 1 % GEL Apply 4 g topically 4 (four) times daily.   DULoxetine (CYMBALTA) 20 MG capsule Take 20 mg by mouth once a day for one week. Then increase to 20 mg twice a day and stay at that dose thereafter.   ferrous sulfate 325 (65 FE) MG tablet Take 325 mg by mouth daily.     HYDROcodone-acetaminophen (NORCO) 7.5-325 MG tablet Take 1 tablet by mouth every 8 (eight) hours as needed. for pain   KLOR-CON M20 20 MEQ tablet TAKE 1 TABLET BY MOUTH 2 TIMES A DAY   Lansoprazole (PREVACID 24HR PO) Take 1 capsule by mouth daily.     levETIRAcetam (KEPPRA) 1000 MG tablet Take 1,000 mg by mouth 2 (two) times daily.   Omega-3 1000 MG CAPS Take 1 g by mouth daily.    pravastatin (PRAVACHOL) 20 MG tablet Take 20 mg by mouth daily.   tiZANidine (ZANAFLEX) 4 MG tablet Take 4 mg by mouth 2 (two) times daily.   triamterene-hydrochlorothiazide  (MAXZIDE-25) 37.5-25 MG tablet Take 1 tablet by mouth daily.   valproic acid (DEPAKENE) 250 MG capsule Take 250 mg by mouth 2 (two) times daily.   Vitamin D, Ergocalciferol, (DRISDOL) 50000 units CAPS capsule Take 50,000 Units by mouth every 7 (seven) days.    [DISCONTINUED] carvedilol (COREG) 6.25 MG tablet Take 1 tablet (6.25 mg total) by mouth 2 (two) times daily with a meal. Patient needs to schedule  an appointment for refills   [DISCONTINUED] folic acid (FOLVITE) 1 MG tablet Take 1 mg by mouth daily.   [DISCONTINUED] pregabalin (LYRICA) 100 MG capsule Take 100 mg by mouth 2 (two) times daily.   No facility-administered medications prior to visit.    Past Surgical History:  Procedure Laterality Date   CARDIAC CATHETERIZATION  07/14/2010   "nonobstructive CAD"   DILATION AND CURETTAGE OF UTERUS  2002   FASCIECTOMY Right 09/02/2014   Procedure: RIGHT HAND FASCIECTOMY RIGHT RING AND RIGHT MIDDLE FINGER ;  Surgeon: Cindee Salt, MD;  Location: [facility];  Service: Orthopedics;  Laterality: Right;   ORIF HUMERUS FRACTURE Left 01/13/2015   Procedure: OPEN REDUCTION INTERNAL FIXATION (ORIF) LEFT PROXIMAL HUMERUS FRACTURE;  Surgeon: Francena Hanly, MD;  Location: [facility] OR;  Service: Orthopedics;  Laterality: Left;   Family Status  Relation Name Status   Father  Deceased at age 91   Mother  Deceased at age 55       CVA / MI   Sister  Alive       MI   Sister  (Not Specified)  No partnership data on file   Family History  Problem Relation Age of Onset   Congestive Heart Failure Father    COPD Father    CVA Father    Stroke Mother    Heart attack Mother    Heart attack Sister 33   Social History   Socioeconomic History   Marital status: Married    Spouse name: Not on file   Number of children: 1   Years of education: Not on file   Highest education level: Not on file  Occupational History   Occupation: Control and instrumentation engineer: [employer]  Tobacco Use   Smoking status: Never    Smokeless tobacco: Never  Substance and Sexual Activity   Alcohol use: Yes    Alcohol/week: 0.0 standard drinks of alcohol    Comment: 3-4 beers daily   Drug use: No   Sexual activity: Yes  Other Topics Concern   Not on file  Social History Narrative   Not on file   Social Drivers of Health   Financial Resource Strain: Low Risk  (07/31/2023)   Overall Financial Resource Strain (CARDIA)    Difficulty of Paying Living Expenses: Not hard at all  Food Insecurity: No Food Insecurity (07/31/2023)   Hunger Vital Sign    Worried About Running Out of Food in the Last Year: Never true    Ran Out of Food in the Last Year: Never true  Transportation Needs: No Transportation Needs (07/31/2023)   PRAPARE - Administrator, Civil Service (Medical): No    Lack of Transportation (Non-Medical): No  Physical Activity: Not on file  Stress: No Stress Concern Present (07/31/2023)   [employer] of Occupational Health - Occupational Stress Questionnaire    Feeling of Stress : Only a little  Social Connections: Not on file    Allergies  Allergen Reactions   Penicillins Hives    Immunization History  Administered Date(s) Administered   Influenza,inj,Quad PF,6+ Mos 02/01/2014   Moderna Covid-19 Vaccine Bivalent Booster 32yrs & up 05/02/2021   Moderna Sars-Covid-2 Vaccination 07/28/2019, 08/25/2019   Td 05/14/2005   Tdap 02/01/2014    Health Maintenance  Topic Date Due   Medicare Annual Wellness (AWV)  Never done   Pneumococcal Vaccine 8-94 Years old (1 of 2 - PCV) Never done   HIV Screening  Never done  Cervical Cancer Screening (HPV/Pap Cotest)  Never done   Colonoscopy  Never done   MAMMOGRAM  Never done   Zoster Vaccines- Shingrix (1 of 2) Never done   INFLUENZA VACCINE  12/13/2022   COVID-19 Vaccine (4 - 2024-25 season) 01/13/2023   DTaP/Tdap/Td (3 - Td or Tdap) 02/02/2024   Hepatitis C Screening  Completed   HPV VACCINES  Aged Out    Patient Care  Team: Simmons-Robinson, Tawanna Cooler, MD as PCP - General (Family Medicine) Rollene Rotunda, MD as PCP - Cardiology (Cardiology)  Review of Systems      Objective    BP 100/79   Pulse 81   Ht 5\' 7"  (1.702 m)   Wt 121 lb (54.9 kg)   LMP 09/15/2011   SpO2 100%   BMI 18.95 kg/m  BP Readings from Last 3 Encounters:  07/31/23 100/79  01/20/16 (!) 148/94  10/19/15 138/82   Wt Readings from Last 3 Encounters:  07/31/23 121 lb (54.9 kg)  01/20/16 167 lb 12.8 oz (76.1 kg)  10/19/15 164 lb 4 oz (74.5 kg)        Depression Screen    07/31/2023   10:35 AM 04/19/2015    2:48 PM  PHQ 2/9 Scores  PHQ - 2 Score 0 3  PHQ- 9 Score 4 13   No results found for any visits on 07/31/23.   Physical Exam Physical Exam   HEENT: Tonsils absent. CHEST: Lungs clear to auscultation, no wheezing, no crackles. CARDIOVASCULAR: Heart regular rate and rhythm. MUSCULOSKELETAL: Limited finger extension. Kyphosis present. Right upper extremity deformity with limited ROM       Assessment & Plan      Problem List Items Addressed This Visit       Cardiovascular and Mediastinum   Essential hypertension - Primary   Relevant Medications   pravastatin (PRAVACHOL) 20 MG tablet   metoprolol succinate (TOPROL-XL) 25 MG 24 hr tablet   Other Relevant Orders   Ambulatory referral to Cardiology   CMP14+EGFR   Coronary atherosclerosis of native coronary artery   Relevant Medications   pravastatin (PRAVACHOL) 20 MG tablet   metoprolol succinate (TOPROL-XL) 25 MG 24 hr tablet   Other Relevant Orders   CBC   Atrial fibrillation (HCC)   Relevant Medications   pravastatin (PRAVACHOL) 20 MG tablet   metoprolol succinate (TOPROL-XL) 25 MG 24 hr tablet   Other Relevant Orders   Ambulatory referral to Cardiology     Digestive   GERD   Relevant Orders   CBC     Endocrine   Multiple thyroid nodules   Relevant Medications   metoprolol succinate (TOPROL-XL) 25 MG 24 hr tablet   Other Relevant Orders    TSH+T4F+T3Free   US THYROID     Nervous and Auditory   Polyneuropathy   Relevant Medications   levETIRAcetam (KEPPRA) 1000 MG tablet   valproic acid (DEPAKENE) 250 MG capsule   folic acid (FOLVITE) 1 MG tablet   gabapentin (NEURONTIN) 100 MG capsule   Other Relevant Orders   Ambulatory referral to Neurosurgery   Hemoglobin A1c   Vitamin B12   Complex regional pain syndrome of right upper extremity   Relevant Medications   levETIRAcetam (KEPPRA) 1000 MG tablet   valproic acid (DEPAKENE) 250 MG capsule   gabapentin (NEURONTIN) 100 MG capsule   Other Relevant Orders   Ambulatory referral to Neurosurgery   Cervical radiculopathy   Relevant Medications   levETIRAcetam (KEPPRA) 1000 MG tablet   valproic acid (DEPAKENE)  250 MG capsule   gabapentin (NEURONTIN) 100 MG capsule   Other Relevant Orders   Ambulatory referral to Neurosurgery     Musculoskeletal and Integument   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   Relevant Orders   Ambulatory referral to Neurosurgery   Dupuytren contracture of both hands   Relevant Orders   Ambulatory referral to Neurosurgery   Degeneration of intervertebral disc of lumbar region   Relevant Orders   Ambulatory referral to Neurosurgery   Compression fracture of second lumbar vertebra [facility])   Relevant Orders   Ambulatory referral to Neurosurgery     Other   Seizure [facility])   Relevant Medications   levETIRAcetam (KEPPRA) 1000 MG tablet   valproic acid (DEPAKENE) 250 MG capsule   gabapentin (NEURONTIN) 100 MG capsule   Insomnia due to anxiety and fear   Hypertriglyceridemia   Relevant Medications   pravastatin (PRAVACHOL) 20 MG tablet   metoprolol succinate (TOPROL-XL) 25 MG 24 hr tablet   Other Relevant Orders   Lipid panel   Hyperlipidemia with target LDL less than 100   Relevant Medications   pravastatin (PRAVACHOL) 20 MG tablet   metoprolol succinate (TOPROL-XL) 25 MG 24 hr tablet   Other Relevant Orders    Lipid panel   Depression with anxiety   Other Visit Diagnoses       Encounter for screening mammogram for malignant neoplasm of breast       Relevant Orders   MM 3D SCREENING MAMMOGRAM BILATERAL BREAST     History of alcohol use disorder       Relevant Medications   gabapentin (NEURONTIN) 100 MG capsule     Seizure disorder (HCC)       Relevant Medications   levETIRAcetam (KEPPRA) 1000 MG tablet   valproic acid (DEPAKENE) 250 MG capsule   gabapentin (NEURONTIN) 100 MG capsule   Other Relevant Orders   Ambulatory referral to Neurology     Other osteoporosis with current pathological fracture, initial encounter       Relevant Orders   VITAMIN D 25 Hydroxy (Vit-D Deficiency, Fractures)     Injury of right ankle, initial encounter       Relevant Orders   Ambulatory referral to Podiatry     Thyroid pain       Relevant Orders   US THYROID          Right foot injury Acute right foot injury following a fall two days ago, with bruising and swelling suggestive of a possible fracture. - Refer to [facility] for evaluation of the right foot injury.  Right arm injury Chronic right arm injury from a fall in July, initially evaluated in [place]. Procedure was not performed due to atrial fibrillation. The arm healed with deformity and pain, causing neuropathy and discomfort under the shoulder. Further orthopedic evaluation is needed to assess the necessity of surgical intervention, potentially involving rebreaking and plating the arm. - Refer to [facility] for evaluation of the right arm injury.  Atrial fibrillation Chronic atrial fibrillation managed with metoprolol and Eliquis. Transitioned from carvedilol to metoprolol, starting at 25 mg once daily, pending further cardiology evaluation. - Start metoprolol 25 mg once daily. - Refer to cardiology for further management.  Seizure disorder Seizure disorder diagnosed three years ago, with a significant event leading to coma. Managed  with Keppra and valproic acid. Neurology referral is needed for ongoing management and assessment of treatment efficacy. - Refer to neurology for further management of seizure disorder.  Chronic pain Chronic pain in the right upper extremity and back, with a history of spinal deformity, seven fractures, and degenerative disc disease. Pain management includes tramadol, Tylenol, gabapentin, and previous back injections. Neurosurgical evaluation is necessary to explore potential interventions for pain relief. - Refer to neurosurgery for evaluation of back pain and potential interventions. - Prescribe gabapentin 100 mg at night.  Neuropathy Neuropathy affecting the back, legs, and feet, contributing to falls and difficulty walking. Dupuytren's contracture in both hands affects finger function. Further evaluation is needed to assess the extent and cause of neuropathy. - Refer to neurosurgery for evaluation of neuropathy. - Order B12 and vitamin D levels to assess for contributing factors.  Alcohol use disorder Alcohol use disorder with recent abstinence for 44 days. Continued abstinence is encouraged to support overall health and management of comorbid conditions.  General Health Maintenance Routine health maintenance and screenings are due. She has osteoporosis and multiple thyroid nodules. Comprehensive evaluation is necessary to monitor and manage these conditions. - Order mammogram for breast cancer screening. - Order thyroid ultrasound to evaluate thyroid nodules. - Order comprehensive labs including cholesterol, kidney and liver function, A1c, CBC, B12, and vitamin D levels.  Follow-up Follow-up is needed to address multiple health concerns and establish care with specialists. Coordination of care is essential to manage her complex medical history. - Schedule follow-up visit in early April to discuss pain management and controlled medications. - Order labs and instruct her to re turn for  blood work between 8 AM and 11:30 AM or 1 PM to 4:30 PM, Monday through Friday. - Coordinate referrals to neurosurgery, neurology, cardiology, and podiatry. - Advise her to answer unknown numbers for referral scheduling.          Return in about 2 weeks (around 08/14/2023) for CHRONIC F/U.      Ronnald Ramp, MD  [facility] [phone] (phone) [phone] (fax)  [facility]

## 2023-08-05 ENCOUNTER — Telehealth: Payer: Self-pay

## 2023-08-05 NOTE — Telephone Encounter (Signed)
 Copied from CRM [id number]. Topic: Clinical - Medication Question >> Aug 05, 2023  3:16 PM Everette C wrote: Reason for CRM: The patient would like to be contacted by a member of clinical staff when possible to review and confirm the dosage of their prescription for metoprolol succinate (TOPROL-XL) 25 MG 24 hr tablet [[id number]]   Please contact further when possible

## 2023-08-05 NOTE — Telephone Encounter (Signed)
 Patient called and she says that she has a prescription that was picked up and it says Metoprolol ER 50 mg and she supposed to be on 25 mg, wonders if she can split the pill in half. She says Dr. Roxan Hockey misunderstood her when she was going over the medications. Advised on 07/31/23 when she was seen in the office that Dr. Roxan Hockey prescribed Metoprolol XL 25 mg daily not 50 mg. Advised since the one she has is an extended release pill, it's best not to split those. Advised to call the pharmacy and let them know she will need to pick up the one that was sent in for 25 mg. She verbalized understanding. She also says to ask Dr. Roxan Hockey is it necessary to take Folic Acid because when she picked it up from the pharmacy it cost $25 and she doesn't usually pay for it, but her insurance will change in April so hopefully it will be no cost. Advised I will send this to Dr. Roxan Hockey, but on 07/31/23 a 3 mo supply/3 refills was sent. She says the bottle she has says it was filled on 07/23/23. Advised it must not be the Rx Dr. Roxan Hockey sent in, since she just saw her on 07/31/23. Advised all that was sent to the pharmacy on 07/31/23 are probably on file for the next refill. She verbalized understanding.

## 2023-08-07 ENCOUNTER — Other Ambulatory Visit: Payer: Self-pay | Admitting: Family Medicine

## 2023-08-07 ENCOUNTER — Telehealth: Payer: Self-pay

## 2023-08-07 NOTE — Telephone Encounter (Signed)
 Copied from CRM [id number]. Topic: Clinical - Medication Refill >> Aug 07, 2023  4:02 PM Geroge Baseman wrote: Most Recent Primary Care Visit:  Provider: Ronnald Ramp  Department: [facility]  Visit Type: NEW PATIENT  Date: 07/31/2023  Medication:  diclofenac sodium (VOLTAREN) 1 % GEL  Has the patient contacted their pharmacy? Yes (Agent: If no, request that the patient contact the pharmacy for the refill. If patient does not wish to contact the pharmacy document the reason why and proceed with request.) (Agent: If yes, when and what did the pharmacy advise?)  Is this the correct pharmacy for this prescription? Yes If no, delete pharmacy and type the correct one.  This is the patient's preferred pharmacy:   [id number] - [place], [place] - [address] [place] [address] Phone: [phone] Fax: [phone]   Has the prescription been filled recently? No  Is the patient out of the medication? Yes, four more days worth.  Has the patient been seen for an appointment in the last year OR does the patient have an upcoming appointment? No  Can we respond through MyChart? No  Agent: Please be advised that Rx refills may take up to 3 business days. We ask that you follow-up with your pharmacy.

## 2023-08-07 NOTE — Telephone Encounter (Signed)
 Copied from CRM [id number]. Topic: Clinical - Prescription Issue >> Aug 07, 2023  4:06 PM Geroge Baseman wrote: Reason for CRM:  Patient requests refills on:  diclofenac sodium (VOLTAREN) 1 % GEL (not available for refill per epic, states must place new order)  hydroxine hcl 50 mg tablets (not listed in medication list)  [facility] [address] [place], [place]

## 2023-08-07 NOTE — Telephone Encounter (Signed)
ERROR

## 2023-08-12 MED ORDER — DICLOFENAC SODIUM 1 % EX GEL: 4.0000 g | Freq: Four times a day (QID) | CUTANEOUS | 0 refills | Status: DC

## 2023-08-12 NOTE — Progress Notes (Unsigned)
 Referring Physician:  Ronnald Ramp, MD [address] [place],  [place] [address]  Primary Physician:  Ronnald Ramp, MD  History of Present Illness: 08/13/23 Christine Garcia is here today with a chief complaint of arm pain, intermittent numbness and tingling in her hands as well as low back pain.  Patient states that she had a right arm injury from a fall back in July 2024, however it has not healed.  She adds that they decided treated nonoperatively due to her A-fib, but feels as though her arm is "floating" she states she is unable to do much with her right upper extremity and is concerned.  She does states she has intermittent pain in the back of her neck however it is not consistent or severe.  The intermittent numbness and tingling in her hands is in bilateral upper extremities, but worse in the right compared to the left.  She has contractures in bilateral hands that make it difficult at baseline.   In addition, patient states that she has a history of multiple spine fractures and degenerative disc disease in which she has previously undergone injections for.  She is concerned because she feels as though her ambulation has been affected and she been tripping on her feet and always feels as though there is something on the bottom of her right foot.  She uses a cane for ambulation and she denies any pain that shoots down bilateral lower extremities consistently however feels as though sometimes her "feet stop".  Conservative measures:  Physical therapy:  has not participated in PT Multimodal medical therapy including regular antiinflammatories:  Tramadol, Tizanidine, Hydrocodone, Cymbalta Injections: 12/13/2017, 11/11/2019 epidural steroid injections Lumbar spine   The symptoms are causing a significant impact on the patient's life.   Review of Systems:  A 10 point review of systems is negative, except for the pertinent positives and negatives  detailed in the HPI.  Past Medical History: Past Medical History:  Diagnosis Date   Anxiety    Complication of anesthesia    states "is hard to numb"   Dupuytren's contracture of right hand 08/2014   ring/middle fingers   GERD (gastroesophageal reflux disease)    High triglycerides    History of atrial fibrillation    one occurrence - states K+ was low at the time   Hypertension    under control with med., has been on med. x 2 yr.   Migraines    occasional   Murmur    states no problems   Seasonal allergies     Past Surgical History: Past Surgical History:  Procedure Laterality Date   CARDIAC CATHETERIZATION  07/14/2010   "nonobstructive CAD"   DILATION AND CURETTAGE OF UTERUS  2002   FASCIECTOMY Right 09/02/2014   Procedure: RIGHT HAND FASCIECTOMY RIGHT RING AND RIGHT MIDDLE FINGER ;  Surgeon: Cindee Salt, MD;  Location: [facility];  Service: Orthopedics;  Laterality: Right;   ORIF HUMERUS FRACTURE Left 01/13/2015   Procedure: OPEN REDUCTION INTERNAL FIXATION (ORIF) LEFT PROXIMAL HUMERUS FRACTURE;  Surgeon: Francena Hanly, MD;  Location: [facility];  Service: Orthopedics;  Laterality: Left;    Allergies: Allergies as of 08/13/2023 - Review Complete 08/13/2023  Allergen Reaction Noted   Penicillins Hives 07/10/2010    Medications: Outpatient Encounter Medications as of 08/13/2023  Medication Sig   acetaminophen (TYLENOL) 325 MG tablet Take 650 mg by mouth every 6 (six) hours as needed.   aspirin 81 MG tablet Take 81 mg  by mouth daily.     diclofenac Sodium (VOLTAREN) 1 % GEL Apply 4 g topically 4 (four) times daily.   DULoxetine (CYMBALTA) 20 MG capsule Take 20 mg by mouth once a day for one week. Then increase to 20 mg twice a day and stay at that dose thereafter.   ELIQUIS 5 MG TABS tablet Take 5 mg by mouth 2 (two) times daily.   ferrous sulfate 325 (65 FE) MG tablet Take 325 mg by mouth daily.     folic acid (FOLVITE) 1 MG tablet Take 1 tablet (1 mg total) by  mouth daily.   gabapentin (NEURONTIN) 100 MG capsule Take 1 capsule (100 mg total) by mouth at bedtime.   HYDROcodone-acetaminophen (NORCO) 7.5-325 MG tablet Take 1 tablet by mouth every 8 (eight) hours as needed. for pain   KLOR-CON M20 20 MEQ tablet TAKE 1 TABLET BY MOUTH 2 TIMES A DAY   levETIRAcetam (KEPPRA) 1000 MG tablet Take 1,000 mg by mouth 2 (two) times daily.   naloxone (NARCAN) nasal spray 4 mg/0.1 mL Narcan 4 mg/actuation nasal spray  1 spray in one nostril may repeat dose every 2-3 mins until patient is responsive or EMS arrives.  use as needed for opiate overdose.   pravastatin (PRAVACHOL) 20 MG tablet Take 20 mg by mouth daily.   pregabalin (LYRICA) 200 MG capsule Take 1 capsule by mouth 2 (two) times daily.   valproic acid (DEPAKENE) 250 MG capsule Take 250 mg by mouth 2 (two) times daily.   [DISCONTINUED] hydrOXYzine (ATARAX) 25 MG tablet Take 25 mg by mouth 3 (three) times daily.   metoprolol succinate (TOPROL-XL) 25 MG 24 hr tablet Take 1 tablet (25 mg total) by mouth daily. (Patient not taking: Reported on 08/13/2023)   [DISCONTINUED] ALPRAZolam (XANAX) 0.5 MG tablet Take 0.5 mg by mouth daily as needed for anxiety.    [DISCONTINUED] diclofenac sodium (VOLTAREN) 1 % GEL Apply 4 g topically 4 (four) times daily.   [DISCONTINUED] Lansoprazole (PREVACID 24HR PO) Take 1 capsule by mouth daily.     [DISCONTINUED] Omega-3 1000 MG CAPS Take 1 g by mouth daily.  (Patient not taking: Reported on 08/13/2023)   [DISCONTINUED] tiZANidine (ZANAFLEX) 4 MG tablet Take 4 mg by mouth 2 (two) times daily.   [DISCONTINUED] triamterene-hydrochlorothiazide (MAXZIDE-25) 37.5-25 MG tablet Take 1 tablet by mouth daily.   [DISCONTINUED] Vitamin D, Ergocalciferol, (DRISDOL) 50000 units CAPS capsule Take 50,000 Units by mouth every 7 (seven) days.    No facility-administered encounter medications on file as of 08/13/2023.    Social History: Social History   Tobacco Use   Smoking status: Never    Smokeless tobacco: Never  Substance Use Topics   Alcohol use: Yes    Alcohol/week: 0.0 standard drinks of alcohol    Comment: 3-4 beers daily   Drug use: No    Family Medical History: Family History  Problem Relation Age of Onset   Congestive Heart Failure Father    COPD Father    CVA Father    Stroke Mother    Heart attack Mother    Heart attack Sister 52    Physical Examination: @VITALWITHPAIN @  General: Patient is well developed, well nourished, calm, collected, and in no apparent distress. Attention to examination is appropriate.  Psychiatric: Patient is non-anxious.  Head:  Pupils equal, round, and reactive to light.  ENT:  Oral mucosa appears well hydrated.  Neck:   Supple.  Full range of motion.  Respiratory: Patient is breathing without  any difficulty.  Extremities: No edema.  Vascular: Palpable dorsal pedal pulses.  Skin:   On exposed skin, there are no abnormal skin lesions.  NEUROLOGICAL:     Awake, alert, oriented to person, place, and time.  Speech is clear and fluent. Fund of knowledge is appropriate.   Cranial Nerves: Pupils equal round and reactive to light.  Facial tone is symmetric.  Facial sensation is symmetric.   Some tenderness to palpation of cervical and thoracic spine  Strength:  ** contractures in  bilateral hands   Obviously displaced right humerus fracture.  This is mobile and severely unstable.  Right upper extremity exam deferred.  Left upper extremity patient is able to actively range.   Side Iliopsoas Quads Hamstring PF DF EHL  R 5 5 5 5 5 5   L 5 5 5 5 5 5       Hoffman's is absent, but difficult due to contractures. Clonus is not present.  Toes are down-going.  Bilateral upper and lower extremity sensation is intact to light touch.    Abnormal gait.  Medical Decision Making  Imaging:  CT RUE 12/2022 1. A healing fracture through the middle third of the right humeral shaft with external callus formation but without  significant internal point bridging. There is nonspecific periosteal reaction, likely related to fracture healing.  2. Nonspecific periosteal reaction at the fracture site, likely related to fracture healing.   I have personally reviewed the images and agree with the above interpretation.  Assessment and Plan: Christine Garcia is a pleasant 63 y.o. female with a past medical history of anxiety, Dupuytren's contractures, A-fib, hypertension, and CRPS diagnosis.  She comes today with predominantly right arm pain as well as neck and back pain.  Right displaced humerus fracture -Updated x-rays today -Orthopedic reach out to and recommended surgeons in [place].   Neuropathy/back pain -Labs already ordered per primary workup -Referral to neurology made, formal consult and EMG requested. -Lumbar x-rays ordered today  Pleasure to see patient in clinic today.  Of utmost importance is grossly unstable right humerus fracture.  Patient does have history of multiple compression fractures in her spine and I am concerned due to lack of healing.  Endocrinology consult placed as well for evaluation.  Thank you for involving me in the care of this patient.   I spent a total of 60 minutes in both face-to-face and non-face-to-face activities for this visit on the date of this encounter including preparing to see the patient, reviewing previous test, obtaining and reviewing history and examination, counseling the patient and her spouse, ordering additional tests, discussing her case with on-call orthopedic specialist, independently interpreting results, and care coordination.  Joan Flores, PA-C Dept. of Neurosurgery

## 2023-08-13 ENCOUNTER — Ambulatory Visit
Admission: RE | Admit: 2023-08-13 | Discharge: 2023-08-13 | Disposition: A | Discharge status: Home / Self Care | Attending: Physician Assistant | Admitting: Physician Assistant

## 2023-08-13 ENCOUNTER — Telehealth: Payer: Self-pay | Admitting: Family Medicine

## 2023-08-13 ENCOUNTER — Ambulatory Visit (INDEPENDENT_AMBULATORY_CARE_PROVIDER_SITE_OTHER): Admitting: Physician Assistant

## 2023-08-13 ENCOUNTER — Encounter: Payer: Self-pay | Admitting: Physician Assistant

## 2023-08-13 ENCOUNTER — Ambulatory Visit
Admission: RE | Admit: 2023-08-13 | Discharge: 2023-08-13 | Disposition: A | Discharge status: Home / Self Care | Source: Ambulatory Visit | Attending: Physician Assistant | Admitting: Physician Assistant

## 2023-08-13 VITALS — BP 112/68 | Ht 64.0 in | Wt 114.0 lb

## 2023-08-13 DIAGNOSIS — G8929 Other chronic pain: Secondary | ICD-10-CM | POA: Insufficient documentation

## 2023-08-13 DIAGNOSIS — S42209A Unspecified fracture of upper end of unspecified humerus, initial encounter for closed fracture: Secondary | ICD-10-CM | POA: Diagnosis not present

## 2023-08-13 DIAGNOSIS — S42301D Unspecified fracture of shaft of humerus, right arm, subsequent encounter for fracture with routine healing: Secondary | ICD-10-CM | POA: Diagnosis not present

## 2023-08-13 DIAGNOSIS — M549 Dorsalgia, unspecified: Secondary | ICD-10-CM | POA: Diagnosis not present

## 2023-08-13 DIAGNOSIS — R202 Paresthesia of skin: Secondary | ICD-10-CM | POA: Diagnosis not present

## 2023-08-13 DIAGNOSIS — R200 Anesthesia of skin: Secondary | ICD-10-CM | POA: Insufficient documentation

## 2023-08-13 DIAGNOSIS — S42309G Unspecified fracture of shaft of humerus, unspecified arm, subsequent encounter for fracture with delayed healing: Secondary | ICD-10-CM

## 2023-08-13 DIAGNOSIS — M545 Low back pain, unspecified: Secondary | ICD-10-CM

## 2023-08-13 DIAGNOSIS — M40209 Unspecified kyphosis, site unspecified: Secondary | ICD-10-CM | POA: Diagnosis not present

## 2023-08-13 DIAGNOSIS — M4856XA Collapsed vertebra, not elsewhere classified, lumbar region, initial encounter for fracture: Secondary | ICD-10-CM | POA: Diagnosis not present

## 2023-08-13 MED ORDER — HYDROXYZINE HCL 25 MG PO TABS: 25.0000 mg | ORAL_TABLET | Freq: Three times a day (TID) | ORAL | 0 refills | Status: DC

## 2023-08-13 NOTE — Telephone Encounter (Signed)
[facility] faxed refill request for the following medications:  hydrOXYzine (ATARAX) 25 MG tablet   Please advise.

## 2023-08-14 ENCOUNTER — Other Ambulatory Visit: Payer: Self-pay | Admitting: Physician Assistant

## 2023-08-14 ENCOUNTER — Telehealth: Payer: Self-pay

## 2023-08-14 DIAGNOSIS — S32019G Unspecified fracture of first lumbar vertebra, subsequent encounter for fracture with delayed healing: Secondary | ICD-10-CM

## 2023-08-14 DIAGNOSIS — F409 Phobic anxiety disorder, unspecified: Secondary | ICD-10-CM

## 2023-08-14 DIAGNOSIS — S32049D Unspecified fracture of fourth lumbar vertebra, subsequent encounter for fracture with routine healing: Secondary | ICD-10-CM

## 2023-08-14 DIAGNOSIS — F419 Anxiety disorder, unspecified: Secondary | ICD-10-CM

## 2023-08-14 DIAGNOSIS — S32029G Unspecified fracture of second lumbar vertebra, subsequent encounter for fracture with delayed healing: Secondary | ICD-10-CM

## 2023-08-14 DIAGNOSIS — S22009B Unspecified fracture of unspecified thoracic vertebra, initial encounter for open fracture: Secondary | ICD-10-CM

## 2023-08-14 DIAGNOSIS — M5416 Radiculopathy, lumbar region: Secondary | ICD-10-CM

## 2023-08-14 LAB — CMP14+EGFR
ALT: 15 IU/L (ref 0–32)
AST: 20 IU/L (ref 0–40)
Albumin: 4.5 g/dL (ref 3.9–4.9)
Alkaline Phosphatase: 81 IU/L (ref 44–121)
BUN/Creatinine Ratio: 16 (ref 12–28)
BUN: 12 mg/dL (ref 8–27)
Bilirubin Total: 0.4 mg/dL (ref 0.0–1.2)
CO2: 27 mmol/L (ref 20–29)
Calcium: 9.6 mg/dL (ref 8.7–10.3)
Chloride: 91 mmol/L — ABNORMAL LOW (ref 96–106)
Creatinine, Ser: 0.75 mg/dL (ref 0.57–1.00)
Globulin, Total: 2.2 g/dL (ref 1.5–4.5)
Glucose: 96 mg/dL (ref 70–99)
Potassium: 4.6 mmol/L (ref 3.5–5.2)
Sodium: 135 mmol/L (ref 134–144)
Total Protein: 6.7 g/dL (ref 6.0–8.5)
eGFR: 90 mL/min/{1.73_m2} (ref >59)

## 2023-08-14 LAB — LIPID PANEL
Chol/HDL Ratio: 3.7 ratio (ref 0.0–4.4)
Cholesterol, Total: 229 mg/dL — ABNORMAL HIGH (ref 100–199)
HDL: 62 mg/dL (ref >39)
LDL Chol Calc (NIH): 157 mg/dL — ABNORMAL HIGH (ref 0–99)
Triglycerides: 59 mg/dL (ref 0–149)
VLDL Cholesterol Cal: 10 mg/dL (ref 5–40)

## 2023-08-14 LAB — CBC
Hematocrit: 40 % (ref 34.0–46.6)
Hemoglobin: 13.3 g/dL (ref 11.1–15.9)
MCH: 30.7 pg (ref 26.6–33.0)
MCHC: 33.3 g/dL (ref 31.5–35.7)
MCV: 92 fL (ref 79–97)
Platelets: 321 10*3/uL (ref 150–450)
RBC: 4.33 x10E6/uL (ref 3.77–5.28)
RDW: 11.9 % (ref 11.7–15.4)
WBC: 6.4 10*3/uL (ref 3.4–10.8)

## 2023-08-14 LAB — TSH+T4F+T3FREE
Free T4: 1.67 ng/dL (ref 0.82–1.77)
T3, Free: 3 pg/mL (ref 2.0–4.4)
TSH: 1.25 u[IU]/mL (ref 0.450–4.500)

## 2023-08-14 LAB — HEMOGLOBIN A1C
Est. average glucose Bld gHb Est-mCnc: 103 mg/dL
Hgb A1c MFr Bld: 5.2 % (ref 4.8–5.6)

## 2023-08-14 LAB — VITAMIN B12: Vitamin B-12: 760 pg/mL (ref 232–1245)

## 2023-08-14 LAB — VITAMIN D 25 HYDROXY (VIT D DEFICIENCY, FRACTURES): Vit D, 25-Hydroxy: 57.9 ng/mL (ref 30.0–100.0)

## 2023-08-14 NOTE — Progress Notes (Addendum)
 Endocrinology referral placed and MRI lumbar ordered

## 2023-08-14 NOTE — Telephone Encounter (Signed)
 Copied from CRM [id number]. Topic: Clinical - Medication Question >> Aug 14, 2023  9:49 AM Gery Pray wrote: Reason for CRM: Patient would like to know if she can take 4 a day of the hydrOXYzine (ATARAX) 25 MG tablet due to her taking 200 mg a day of the hydrOXYzine (ATARAX) 50 MG tablet 4 times daily previously. Please contact patient at [phone].

## 2023-08-15 ENCOUNTER — Other Ambulatory Visit: Payer: Self-pay | Admitting: Physician Assistant

## 2023-08-15 DIAGNOSIS — S32049D Unspecified fracture of fourth lumbar vertebra, subsequent encounter for fracture with routine healing: Secondary | ICD-10-CM

## 2023-08-15 DIAGNOSIS — S32029G Unspecified fracture of second lumbar vertebra, subsequent encounter for fracture with delayed healing: Secondary | ICD-10-CM

## 2023-08-15 DIAGNOSIS — S22009B Unspecified fracture of unspecified thoracic vertebra, initial encounter for open fracture: Secondary | ICD-10-CM

## 2023-08-15 DIAGNOSIS — S32019G Unspecified fracture of first lumbar vertebra, subsequent encounter for fracture with delayed healing: Secondary | ICD-10-CM

## 2023-08-15 NOTE — Addendum Note (Signed)
 Addended by: Joan Flores on: 08/15/2023 10:14 AM   Modules accepted: Orders

## 2023-08-16 MED ORDER — HYDROXYZINE HCL 25 MG PO TABS: 25.0000 mg | ORAL_TABLET | Freq: Four times a day (QID) | ORAL | 1 refills | Status: DC | PRN

## 2023-08-16 NOTE — Telephone Encounter (Signed)
 Patient advised.

## 2023-08-16 NOTE — Telephone Encounter (Signed)
 Patient is ok to take hydroxyzine 25mg  four times daily. Prescription has been updated in order for her to receive the correct amount.

## 2023-08-16 NOTE — Addendum Note (Signed)
 Addended by: Bing Neighbors on: 08/16/2023 08:24 AM   Modules accepted: Orders

## 2023-08-19 ENCOUNTER — Ambulatory Visit: Payer: Self-pay | Admitting: Student

## 2023-08-19 ENCOUNTER — Encounter: Payer: Self-pay | Admitting: Family Medicine

## 2023-08-19 DIAGNOSIS — S42301K Unspecified fracture of shaft of humerus, right arm, subsequent encounter for fracture with nonunion: Secondary | ICD-10-CM | POA: Diagnosis not present

## 2023-08-21 ENCOUNTER — Other Ambulatory Visit: Payer: Self-pay

## 2023-08-21 ENCOUNTER — Encounter (HOSPITAL_COMMUNITY): Payer: Self-pay | Admitting: Student

## 2023-08-21 NOTE — Pre-Procedure Instructions (Addendum)
 SDW CALL  Patient was given pre-op instructions over the phone. The opportunity was given for the patient to ask questions. No further questions asked. Patient verbalized understanding of instructions given.   PCP - Mendel Corning, NP- in [place].    Cardiologist - Rollene Rotunda, MD (prior to moving to [place])- pt states she does not remember her cardiologist's name in [place]    PPM/ICD - denies   Chest x-ray - denies EKG - requested from PCP- DOS if not received- pt reports she had an EKG within the last year  Stress Test - 06/30/2010 ECHO - 09/28/19- care everywhere Cardiac Cath - 07/16/10  Sleep Study - denies   Fasting Blood Sugar - N/A   Blood Thinner Instructions: last dose of Eliquis AM 08/20/23. Pt takes this for Afib Aspirin Instructions: n/a  ERAS Protcol - ERAS per order   COVID TEST- N/A   Anesthesia review: yes- cardiac hx, follow up records- attempted to fax request for EKG/cardiac studies and Office note from PCP in [place]. Marland Kitchen Pt repots hx of seizure last in 2022- pt denies seizure since and has not followed up with neurologist. Pt states she was living in [place] the last year and is just moving back here and re-establishing care here.   Patient denies shortness of breath, fever, cough and chest pain over the phone call    Surgical Instructions    Your procedure is scheduled on August 23, 2023  Report to [facility] Main Entrance "A" at 7:40 A.M., then check in with the Admitting office.  Call this number if you have problems the morning of surgery:  [phone]    Remember:  Do not eat after midnight the night before your surgery.  You may drink clear liquids until 7:10 the morning of your surgery.   Clear liquids allowed are: Water, apple juice, Carbonated Beverages, Clear Tea, Black Coffee ONLY (NO MILK, CREAM OR POWDERED CREAMER of any kind), and Gatorade (no HONEY)   Take these medicines the morning of surgery with A SIP OF WATER: cymbalta,  lyrica, hydroxyzine, keppra, metoprolol, prevastatin tylenol  Follow your surgeon's instructions on stopping Eliquis  As of today, STOP taking any Aspirin (unless otherwise instructed by your surgeon) Aleve, Naproxen, Ibuprofen, Motrin, Advil, Goody's, BC's, all herbal medications, fish oil, and all vitamins., no diclofenac gel  [facility] is not responsible for any belongings or valuables. .   Do NOT Smoke (Tobacco/Vaping)  24 hours prior to your procedure    Contacts, glasses, hearing aids, dentures or partials may not be worn into surgery, please bring cases for these belongings   Patients discharged the day of surgery will not be allowed to drive home, and someone needs to stay with them for 24 hours.   SURGICAL WAITING ROOM VISITATION You may have 1 visitor in the pre-op area at a time determined by the pre-op nurse. (Visitor may not switch out) Patients having surgery or a procedure in a hospital may have two support people in the waiting room. Children under the age of 47 must have an adult with them who is not the patient. They may stay in the waiting area during the procedure and may switch out with other visitors. If the patient needs to stay at the hospital during part of their recovery, the visitor guidelines for inpatient rooms apply.  Please refer to the [facility] website for the visitor guidelines for Inpatients (after your surgery is over and you are in a regular room).  Special instructions:    Oral Hygiene is also important to reduce your risk of infection.  Remember - BRUSH YOUR TEETH THE MORNING OF SURGERY WITH YOUR REGULAR TOOTHPASTE   Day of Surgery:  Take a shower the day of and night before with antibacterial soap. Wear Clean/Comfortable clothing the morning of surgery Do not apply any deodorants/lotions.   Do not wear jewelry or makeup Do not wear lotions, powders, perfumes/colognes, or deodorant. Do not shave 48 hours prior to surgery.   Do not  bring valuables to the hospital. Do not wear nail polish, gel polish, artificial nails, or any other type of covering on natural nails (fingers and toes) If you have artificial nails or gel coating that need to be removed by a nail salon, please have this removed prior to surgery. Artificial nails or gel coating may interfere with anesthesia's ability to adequately monitor your vital signs.

## 2023-08-22 ENCOUNTER — Encounter (HOSPITAL_COMMUNITY): Payer: Self-pay | Admitting: Student

## 2023-08-22 NOTE — Progress Notes (Signed)
 Anesthesia Chart Review: SAME DAY WORK-UP  Case: [id number] Date/Time: 08/23/23 0955   Procedure: OPEN REDUCTION INTERNAL FIXATION (ORIF) HUMERAL SHAFT FRACTURE (Right)   Anesthesia type: General   Pre-op diagnosis: Right humeral nonunion   [facility] ROOM 03 / [facility]   Surgeons: Roby Lofts, MD       DISCUSSION: Patient is a 63 year old female scheduled for the above procedure.  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 Reported "hard to numb". + Marijuana use.   She recently moved back to [facility] fro [place] just a couple of months ago. When she lived in [place] previously, she was evaluated by cardiologist Dr. Rollene Rotunda for PAT in 2012 for chest pain. She had a cardiac cath on 07/14/10 that showed mild non-obstructive disease (25% LAD, RI and ~ 30% RCA but thought possibly due to spasm). She had afib with RVR in 11/2011 that was attributed to poor diet and increased ETOH use. Episode was brief, so anticoagulation therapy was not initiated. She later saw a cardiologist with [facility] in May 2019 for LVH on echo. She had a cMRI on 09/29/17 that showed mild focal LVH in the basal inferoseptal wall that was not felt to be clinically significant. There was no evidence of infiltrative cardiomyopathy. Last noted echo was on 09/28/19 during admission for seizure in setting of hyponatremia, possible due to SIADH or ETOH use. She was noted to be in afib with RVR by EMS. Echo showed LVEF > 55%, normal LV/RV systolic function, trivial AR, mild MR/TR, RVSP 42 mmHg. She was placed on Eliquis at that time. She  thinks she saw a cardiologist while living in [place], but did not recall his/her name.   She has been sober for about 70 days now.  She is a same day work-up. She reported last Eliquis 08/20/23. Last EKG available is > 1 year ago, so will need updated tracing on arrival. She did have labs on 08/13/23 through primary care.    VS: LMP 09/15/2011  BP Readings from Last 3 Encounters:  08/13/23 112/68  07/31/23 100/79  01/20/16 (!) 148/94   Pulse Readings from Last 3 Encounters:  07/31/23 81  01/20/16 74  10/19/15 69     PROVIDERS: Ronnald Ramp, MD is PCP, recently established 07/31/23.     LABS: Most recent results in [facility] include: Lab Results  Component Value Date   WBC 6.4 08/13/2023   HGB 13.3 08/13/2023   HCT 40.0 08/13/2023   PLT 321 08/13/2023   GLUCOSE 96 08/13/2023   CHOL 229 (H) 08/13/2023   TRIG 59 08/13/2023   HDL 62 08/13/2023   LDLDIRECT 85.5 02/01/2014   LDLCALC 157 (H) 08/13/2023   ALT 15 08/13/2023   AST 20 08/13/2023   NA 135 08/13/2023   K 4.6 08/13/2023   CL 91 (L) 08/13/2023   CREATININE 0.75 08/13/2023   BUN 12 08/13/2023   CO2 27 08/13/2023   TSH 1.250 08/13/2023   INR 0.98 01/11/2015   HGBA1C 5.2 08/13/2023     IMAGES: Right Humerus Fracture 08/13/23: FINDINGS: There is an old fracture of the mid proximal humeral diaphyseal region. There is some  callus formation along the proximal fragment laterally. There is significant anterior displacement of the distal humeral fragment in relation to the proximal indicating pseudo arthrosis. (Hypertrophic pseudoarthrosis?) IMPRESSION: Humeral fracture as described with pseudoarthrosis.   CT RUE 01/03/23 ([facility]): IMPRESSION:  1. A healing fracture through the middle third of the right humeral shaft with external callus formation but without significant internal point bridging. There is nonspecific periosteal reaction, likely related to fracture healing.  2. Nonspecific periosteal  reaction at the fracture site, likely related to fracture healing.    EKG: For day of surgery.    CV: TTE 09/28/19 ([facility]): NORMAL LEFT VENTRICULAR SYSTOLIC FUNCTION WITH MILD LVH. LVEF > 55% (Estimated) NORMAL RIGHT VENTRICULAR SYSTOLIC FUNCTION VALVULAR REGURGITATION: TRIVIAL AR, MILD MR, MILD TR NO VALVULAR STENOSIS POOR SOUND TRANSMISSION ESTIMATED RVSP =    MRI Cardiac 09/19/17 ([facility]): IMPRESSION:  1. Mild focal hypertrophy of the left ventricular basal inferoseptal wall, of doubtful clinical  significance.  2. No evidence of infiltrative cardiomyopathy.  3. Multilevel compression deformity of the thoracolumbar spine, some of which demonstrate marrow edema  worrisome for acute or subacute fractures. Please correlate with clinical exam and history of recent  trauma. Patient may benefit from a thoracic spine radiograph.     Cardiac cath 07/14/10: - LM Normal - LAD mid long 25% stenosis - CX in the AV groove was normal - RI large with ostial 25% stenosis - RCA dominant vessel with proximzl 30% stenosis which might be related to spasm - PDA small-moderate size and normal - LVEF 65% with normal wall motoin CONCLUSION:  Nonobstructive coronary artery disease.  There was some suggestion of coronary spasm.  She does have narrow caliber vessels.   Past Medical History:  Diagnosis Date   Anxiety    Atrial fibrillation (HCC)    Complication of anesthesia    states "is hard to numb"   Dupuytren's contracture of right hand 08/13/2014   ring/middle fingers   GERD (gastroesophageal reflux disease)    High triglycerides    History of atrial fibrillation    PAF with RVR 11/2011 & 09/2019   Hypertension    under control with med., has been on med. x 2 yr.   Migraines    occasional   Murmur    trivial AR, mild MR/TR 09/28/19 TTE   Neuropathy    Seasonal allergies    Seizures (HCC)    last seizure in 2022 per patient   Substance abuse (HCC)    pt reports ETOH  abuse in past    Past Surgical History:  Procedure Laterality Date   CARDIAC CATHETERIZATION  07/14/2010   "nonobstructive CAD"   DILATION AND CURETTAGE OF UTERUS  2002   FASCIECTOMY Right 09/02/2014   Procedure: RIGHT HAND FASCIECTOMY RIGHT RING AND RIGHT MIDDLE FINGER ;  Surgeon: Cindee Salt, MD;  Location: [facility];  Service: Orthopedics;  Laterality: Right;   ORIF HUMERUS FRACTURE Left 01/13/2015   Procedure: OPEN REDUCTION INTERNAL FIXATION (ORIF) LEFT PROXIMAL HUMERUS FRACTURE;  Surgeon: Francena Hanly, MD;  Location: [facility];  Service: Orthopedics;  Laterality: Left;    MEDICATIONS: No current facility-administered medications for this encounter.    acetaminophen (TYLENOL) 325 MG tablet   alendronate (FOSAMAX) 70 MG tablet   diclofenac Sodium (VOLTAREN) 1 % GEL   DULoxetine (CYMBALTA) 20 MG capsule   ELIQUIS 5 MG TABS tablet   folic acid (FOLVITE) 1 MG tablet   gabapentin (NEURONTIN) 100 MG  capsule   hydrOXYzine (ATARAX) 25 MG tablet   levETIRAcetam (KEPPRA) 100 MG/ML solution   metoprolol succinate (TOPROL-XL) 25 MG 24 hr tablet   Multiple Vitamins-Minerals (MULTIVITAMIN WITH MINERALS) tablet   naloxone (NARCAN) nasal spray 4 mg/0.1 mL   pravastatin (PRAVACHOL) 20 MG tablet   pregabalin (LYRICA) 200 MG capsule   tetrahydrozoline 0.05 % ophthalmic solution    Shonna Chock, PA-C Surgical Short Stay/Anesthesiology [facility] Phone [phone] [facility] Phone [phone] 08/22/2023 10:45 AM

## 2023-08-22 NOTE — H&P (Signed)
 [facility] (OTS) H&P  Patient ID: Christine Garcia MRN: [id number] DOB/AGE: June 24, 1960 63 y.o.  Reason for surgery: Right humeral shaft fracture  HPI: Christine Garcia is a 63 y.o. female with significant past medical history listed below presenting for surgery on right upper extremity.  Sustained a fall and July 2024, resulting in right humeral shaft fracture.  She was evaluated by an orthopedic provider in [place] at that time.  Initially, the plan was to move forward with surgery but due to her A-fib, decision was made to treat the fracture nonoperatively.  Over the past 8+ months, patient has continued to have pain and limited use of the right arm.  She complains of numbness and tingling through the right upper extremity.  States she feels that her right arm is "floating".  Patient seen in the OTS clinic on 08/20/2023 for evaluation of her right upper extremity.  She presents now for surgical fixation of the fracture. Patient is disabled, not currently working.  She ambulates at baseline with a cane.  She is on Eliquis for her A-fib (Last dose 08/20/2023).  Past Medical History:  Diagnosis Date   Anxiety    Atrial fibrillation (HCC)    Complication of anesthesia    states "is hard to numb"   Dupuytren's contracture of right hand 08/13/2014   ring/middle fingers   GERD (gastroesophageal reflux disease)    High triglycerides    History of atrial fibrillation    PAF with RVR 11/2011 & 09/2019   Hypertension    under control with med., has been on med. x 2 yr.   Migraines    occasional   Murmur    trivial AR, mild MR/TR 09/28/19 TTE   Neuropathy    Seasonal allergies    Seizures (HCC)    last seizure in 2022 per patient   Substance abuse (HCC)    pt reports ETOH abuse in past   Past Surgical History:  Procedure Laterality Date   CARDIAC CATHETERIZATION  07/14/2010   "nonobstructive CAD"   DILATION AND CURETTAGE OF UTERUS  2002   FASCIECTOMY Right 09/02/2014    Procedure: RIGHT HAND FASCIECTOMY RIGHT RING AND RIGHT MIDDLE FINGER ;  Surgeon: Cindee Salt, MD;  Location: [facility];  Service: Orthopedics;  Laterality: Right;   ORIF HUMERUS FRACTURE Left 01/13/2015   Procedure: OPEN REDUCTION INTERNAL FIXATION (ORIF) LEFT PROXIMAL HUMERUS FRACTURE;  Surgeon: Francena Hanly, MD;  Location: [facility] OR;  Service: Orthopedics;  Laterality: Left;   Family History  Problem Relation Age of Onset   Congestive Heart Failure Father    COPD Father    CVA Father    Stroke Mother    Heart attack Mother    Heart attack Sister 34    Social History:  reports that she has never smoked. She has never used smokeless tobacco. She reports that she does not currently use alcohol. She reports current drug use. Frequency: 21.00 times per week. Drug: Marijuana.  Allergies:  Allergies  Allergen Reactions   Penicillins Hives    Medications: Prior to Admission medications   Medication Sig Start Date End Date Taking? Authorizing Provider  acetaminophen (TYLENOL) 325 MG tablet Take 650 mg by mouth every 6 (six) hours as needed for mild pain (pain score 1-3) or moderate pain (pain score 4-6).   Yes [provider]  alendronate (FOSAMAX) 70 MG tablet Take 70 mg by mouth once a week. Take with a full glass of water on an  empty stomach.   Yes [provider]  diclofenac Sodium (VOLTAREN) 1 % GEL Apply 4 g topically 4 (four) times daily. 08/12/23  Yes Pardue, Monico Blitz, DO  DULoxetine (CYMBALTA) 20 MG capsule Take 20 mg by mouth once a day for one week. Then increase to 20 mg twice a day and stay at that dose thereafter. Patient taking differently: Take 30 mg by mouth 2 (two) times daily. 04/03/16  Yes Arnett, Lyn Records, FNP  ELIQUIS 5 MG TABS tablet Take 5 mg by mouth 2 (two) times daily.   Yes [provider]  folic acid (FOLVITE) 1 MG tablet Take 1 tablet (1 mg total) by mouth daily. 07/31/23  Yes Simmons-Robinson, Makiera, MD  gabapentin  (NEURONTIN) 100 MG capsule Take 1 capsule (100 mg total) by mouth at bedtime. 07/31/23  Yes Simmons-Robinson, Makiera, MD  hydrOXYzine (ATARAX) 25 MG tablet Take 1 tablet (25 mg total) by mouth every 6 (six) hours as needed. Patient taking differently: Take 50 mg by mouth 2 (two) times daily. 08/16/23  Yes Simmons-Robinson, Makiera, MD  levETIRAcetam (KEPPRA) 100 MG/ML solution Take 10 mLs by mouth 2 (two) times daily.   Yes [provider]  metoprolol succinate (TOPROL-XL) 25 MG 24 hr tablet Take 1 tablet (25 mg total) by mouth daily. 07/31/23  Yes Simmons-Robinson, Makiera, MD  Multiple Vitamins-Minerals (MULTIVITAMIN WITH MINERALS) tablet Take 1 tablet by mouth daily.   Yes [provider]  naloxone (NARCAN) nasal spray 4 mg/0.1 mL Narcan 4 mg/actuation nasal spray  1 spray in one nostril may repeat dose every 2-3 mins until patient is responsive or EMS arrives.  use as needed for opiate overdose.   Yes [provider]  pravastatin (PRAVACHOL) 20 MG tablet Take 20 mg by mouth daily.   Yes [provider]  pregabalin (LYRICA) 200 MG capsule Take 200 mg by mouth 2 (two) times daily. 08/30/21  Yes [provider]  tetrahydrozoline 0.05 % ophthalmic solution Place 1 drop into both eyes daily as needed (Dry eye).   Yes [provider]   I have reviewed the patient's current medications.  Positive ROS: All other systems have been reviewed and were otherwise negative with the exception of those mentioned in the HPI and as above.  Exam: Last menstrual period 09/15/2011. General: Alert and oriented, no acute distress Cardiovascular: No pedal edema Respiratory: No cyanosis, no use of accessory musculature GI: No organomegaly, abdomen is soft and non-tender Skin: No lesions in the area of chief complaint Neurologic: Sensation intact distally Psychiatric: Patient is competent for consent with normal mood and affect  Musculoskeletal: Right upper  extremity: No significant bruising or swelling noted throughout the extremity.  Patient with with bony prominence and obvious deformity to the humerus.  Motion at the fracture site.  Tenderness with palpation throughout the humerus.  Nontender through the forearm.  Noted to have contracture of her right hand.  Endorses sensation to light touch over the hand. + Radial pulse.  Left upper extremity: Healed surgical scar over the shoulder from previous ORIF proximal humerus.  No tenderness to palpation.  Contracture of the hand noted.  Tolerates shoulder, elbow, wrist motion without significant discomfort.  Motor/sensory function at baseline. Neurovascularly intact.   Medical Decision Making: Data: Imaging: AP and lateral views of the right humerus show displaced midshaft humerus fracture.  There has been some callus formation but fracture not fully healed.  Labs: .No results found for this or any previous visit (from the past  week).   Medical history and chart was reviewed and case discussed with attending provider.  Assessment/Plan: 63 year old female status post right midshaft humerus fracture in July 2024, initially treated nonoperatively.   Patient has unfortunately failed nonoperative management of her right humerus fracture.  She continues to have pain and limited use of the right arm.  On exam, patient has motion at the fracture site.  At this point, I would recommend proceeding with open reduction internal fixation of the right humerus fracture.  Risks and benefits of the procedure have been discussed with the patient.  Risks discussed included bleeding, infection, malunion, nonunion, damage to surrounding nerves and blood vessels, pain, hardware prominence or irritation, hardware failure, stiffness, DVT/PE, compartment syndrome, and even anesthesia complications. Patient states her understanding of these risks and agrees to proceed with surgery.  Consent will be obtained.  Will plan to  discharge patient home from the PACU postoperatively.   Thompson Caul PA-C [facility] [phone] (office) [email or web address]

## 2023-08-22 NOTE — Anesthesia Preprocedure Evaluation (Signed)
 Anesthesia Evaluation  Patient identified by MRN, date of birth, ID band Patient awake    Reviewed: Allergy & Precautions, H&P , NPO status , Patient's Chart, lab work & pertinent test results  History of Anesthesia Complications (+) PONV and history of anesthetic complications  Airway Mallampati: II   Neck ROM: full    Dental   Pulmonary neg pulmonary ROS   breath sounds clear to auscultation       Cardiovascular hypertension,  Rhythm:regular Rate:Normal     Neuro/Psych  Headaches PSYCHIATRIC DISORDERS  Depression Bipolar Disorder    Neuromuscular disease    GI/Hepatic ,GERD  ,,  Endo/Other  Hypothyroidism    Renal/GU      Musculoskeletal  (+)  Fibromyalgia -  Abdominal   Peds  Hematology   Anesthesia Other Findings   Reproductive/Obstetrics                             Anesthesia Physical Anesthesia Plan  ASA: 3  Anesthesia Plan: General   Post-op Pain Management:    Induction: Intravenous  PONV Risk Score and Plan: 4 or greater and Ondansetron, Dexamethasone, Midazolam and Treatment may vary due to age or medical condition  Airway Management Planned: Nasal ETT  Additional Equipment:   Intra-op Plan:   Post-operative Plan: Extubation in OR  Informed Consent: I have reviewed the patients History and Physical, chart, labs and discussed the procedure including the risks, benefits and alternatives for the proposed anesthesia with the patient or authorized representative who has indicated his/her understanding and acceptance.     Dental advisory given  Plan Discussed with: CRNA, Anesthesiologist and Surgeon  Anesthesia Plan Comments: (PAT note written 08/22/2023 by Shonna Chock, PA-C.  )       Anesthesia Quick Evaluation

## 2023-08-23 ENCOUNTER — Ambulatory Visit (HOSPITAL_COMMUNITY)
Admission: RE | Admit: 2023-08-23 | Discharge: 2023-08-23 | Disposition: A | Discharge status: Home / Self Care | Attending: Student | Admitting: Student

## 2023-08-23 ENCOUNTER — Encounter (HOSPITAL_COMMUNITY): Payer: Self-pay | Admitting: Student

## 2023-08-23 ENCOUNTER — Ambulatory Visit (HOSPITAL_COMMUNITY): Payer: Self-pay | Admitting: Vascular Surgery

## 2023-08-23 ENCOUNTER — Ambulatory Visit

## 2023-08-23 ENCOUNTER — Ambulatory Visit (HOSPITAL_COMMUNITY)

## 2023-08-23 ENCOUNTER — Other Ambulatory Visit: Payer: Self-pay

## 2023-08-23 ENCOUNTER — Encounter (HOSPITAL_COMMUNITY)
Admission: RE | Disposition: A | Discharge status: Home / Self Care | Payer: Self-pay | Source: Home / Self Care | Attending: Student

## 2023-08-23 ENCOUNTER — Ambulatory Visit: Admission: RE | Admit: 2023-08-23 | Source: Ambulatory Visit

## 2023-08-23 DIAGNOSIS — I38 Endocarditis, valve unspecified: Secondary | ICD-10-CM | POA: Diagnosis not present

## 2023-08-23 DIAGNOSIS — F32A Depression, unspecified: Secondary | ICD-10-CM | POA: Insufficient documentation

## 2023-08-23 DIAGNOSIS — I4891 Unspecified atrial fibrillation: Secondary | ICD-10-CM | POA: Diagnosis not present

## 2023-08-23 DIAGNOSIS — I2510 Atherosclerotic heart disease of native coronary artery without angina pectoris: Secondary | ICD-10-CM

## 2023-08-23 DIAGNOSIS — F419 Anxiety disorder, unspecified: Secondary | ICD-10-CM | POA: Diagnosis not present

## 2023-08-23 DIAGNOSIS — R569 Unspecified convulsions: Secondary | ICD-10-CM | POA: Insufficient documentation

## 2023-08-23 DIAGNOSIS — I10 Essential (primary) hypertension: Secondary | ICD-10-CM | POA: Diagnosis not present

## 2023-08-23 DIAGNOSIS — K219 Gastro-esophageal reflux disease without esophagitis: Secondary | ICD-10-CM | POA: Diagnosis not present

## 2023-08-23 DIAGNOSIS — S42301K Unspecified fracture of shaft of humerus, right arm, subsequent encounter for fracture with nonunion: Secondary | ICD-10-CM | POA: Diagnosis not present

## 2023-08-23 DIAGNOSIS — W19XXXD Unspecified fall, subsequent encounter: Secondary | ICD-10-CM | POA: Insufficient documentation

## 2023-08-23 DIAGNOSIS — F418 Other specified anxiety disorders: Secondary | ICD-10-CM | POA: Diagnosis not present

## 2023-08-23 DIAGNOSIS — Z7901 Long term (current) use of anticoagulants: Secondary | ICD-10-CM | POA: Insufficient documentation

## 2023-08-23 DIAGNOSIS — S42301A Unspecified fracture of shaft of humerus, right arm, initial encounter for closed fracture: Secondary | ICD-10-CM | POA: Diagnosis not present

## 2023-08-23 DIAGNOSIS — G8918 Other acute postprocedural pain: Secondary | ICD-10-CM | POA: Diagnosis not present

## 2023-08-23 DIAGNOSIS — Z9889 Other specified postprocedural states: Secondary | ICD-10-CM | POA: Diagnosis not present

## 2023-08-23 DIAGNOSIS — R609 Edema, unspecified: Secondary | ICD-10-CM | POA: Diagnosis not present

## 2023-08-23 DIAGNOSIS — E781 Pure hyperglyceridemia: Secondary | ICD-10-CM | POA: Insufficient documentation

## 2023-08-23 HISTORY — DX: Unspecified convulsions: R56.9

## 2023-08-23 HISTORY — DX: Other psychoactive substance abuse, uncomplicated: F19.10

## 2023-08-23 HISTORY — DX: Polyneuropathy, unspecified: G62.9

## 2023-08-23 SURGERY — OPEN REDUCTION INTERNAL FIXATION (ORIF) HUMERAL SHAFT FRACTURE
Anesthesia: Regional | Laterality: Right

## 2023-08-23 MED ORDER — PHENYLEPHRINE 80 MCG/ML (10ML) SYRINGE FOR IV PUSH (FOR BLOOD PRESSURE SUPPORT)
PREFILLED_SYRINGE | INTRAVENOUS | Status: DC | PRN
Start: 2023-08-23 — End: 2023-08-23
  Administered 2023-08-23: 160 ug via INTRAVENOUS

## 2023-08-23 MED ORDER — PROPOFOL 10 MG/ML IV BOLUS
INTRAVENOUS | Status: DC | PRN
Start: 2023-08-23 — End: 2023-08-23
  Administered 2023-08-23: 80 mg via INTRAVENOUS

## 2023-08-23 MED ORDER — OXYCODONE HCL 5 MG/5ML PO SOLN: 5.0000 mg | Freq: Once | ORAL | Status: DC | PRN

## 2023-08-23 MED ORDER — LIDOCAINE 2% (20 MG/ML) 5 ML SYRINGE
INTRAMUSCULAR | Status: AC
  Filled 2023-08-23: qty 5

## 2023-08-23 MED ORDER — DEXAMETHASONE SODIUM PHOSPHATE 10 MG/ML IJ SOLN
INTRAMUSCULAR | Status: AC
  Filled 2023-08-23: qty 1

## 2023-08-23 MED ORDER — 0.9 % SODIUM CHLORIDE (POUR BTL) OPTIME
TOPICAL | Status: DC | PRN
  Administered 2023-08-23: 1000 mL

## 2023-08-23 MED ORDER — PROPOFOL 10 MG/ML IV BOLUS
INTRAVENOUS | Status: AC
  Filled 2023-08-23: qty 20

## 2023-08-23 MED ORDER — FENTANYL CITRATE (PF) 250 MCG/5ML IJ SOLN
INTRAMUSCULAR | Status: AC
  Filled 2023-08-23: qty 5

## 2023-08-23 MED ORDER — FENTANYL CITRATE (PF) 250 MCG/5ML IJ SOLN
INTRAMUSCULAR | Status: DC | PRN
  Administered 2023-08-23: 25 ug via INTRAVENOUS
  Administered 2023-08-23 (×2): 50 ug via INTRAVENOUS

## 2023-08-23 MED ORDER — MIDAZOLAM HCL 2 MG/2ML IJ SOLN
INTRAMUSCULAR | Status: AC
  Filled 2023-08-23: qty 2

## 2023-08-23 MED ORDER — FENTANYL CITRATE (PF) 100 MCG/2ML IJ SOLN
INTRAMUSCULAR | Status: AC
  Filled 2023-08-23: qty 2

## 2023-08-23 MED ORDER — CHLORHEXIDINE GLUCONATE 0.12 % MT SOLN
15.0000 mL | Freq: Once | OROMUCOSAL | Status: AC
  Administered 2023-08-23: 15 mL via OROMUCOSAL
  Filled 2023-08-23: qty 15

## 2023-08-23 MED ORDER — FENTANYL CITRATE (PF) 100 MCG/2ML IJ SOLN: 25.0000 ug | INTRAMUSCULAR | Status: DC | PRN

## 2023-08-23 MED ORDER — HYDROCODONE-ACETAMINOPHEN 5-325 MG PO TABS
1.0000 | ORAL_TABLET | Freq: Four times a day (QID) | ORAL | 0 refills | Status: DC | PRN
Start: 2023-08-23 — End: 2023-09-10

## 2023-08-23 MED ORDER — BUPIVACAINE HCL (PF) 0.25 % IJ SOLN
INTRAMUSCULAR | Status: AC
  Filled 2023-08-23: qty 30

## 2023-08-23 MED ORDER — LIDOCAINE HCL (CARDIAC) PF 100 MG/5ML IV SOSY
PREFILLED_SYRINGE | INTRAVENOUS | Status: DC | PRN
Start: 2023-08-23 — End: 2023-08-23
  Administered 2023-08-23: 40 mg via INTRAVENOUS

## 2023-08-23 MED ORDER — BUPIVACAINE-EPINEPHRINE (PF) 0.5% -1:200000 IJ SOLN
INTRAMUSCULAR | Status: DC | PRN
  Administered 2023-08-23: 25 mL

## 2023-08-23 MED ORDER — VANCOMYCIN HCL 1000 MG IV SOLR
INTRAVENOUS | Status: DC | PRN
  Administered 2023-08-23: 1000 mg

## 2023-08-23 MED ORDER — ONDANSETRON HCL 4 MG/2ML IJ SOLN
INTRAMUSCULAR | Status: DC | PRN
  Administered 2023-08-23: 4 mg via INTRAVENOUS

## 2023-08-23 MED ORDER — ACETAMINOPHEN 10 MG/ML IV SOLN: 1000.0000 mg | Freq: Once | INTRAVENOUS | Status: DC | PRN

## 2023-08-23 MED ORDER — ONDANSETRON HCL 4 MG/2ML IJ SOLN
INTRAMUSCULAR | Status: AC
  Filled 2023-08-23: qty 2

## 2023-08-23 MED ORDER — VANCOMYCIN HCL 1000 MG IV SOLR
INTRAVENOUS | Status: AC
  Filled 2023-08-23: qty 20

## 2023-08-23 MED ORDER — OXYCODONE HCL 5 MG PO TABS: 5.0000 mg | ORAL_TABLET | Freq: Once | ORAL | Status: DC | PRN

## 2023-08-23 MED ORDER — ORAL CARE MOUTH RINSE: 15.0000 mL | Freq: Once | OROMUCOSAL | Status: AC

## 2023-08-23 MED ORDER — MIDAZOLAM HCL 2 MG/2ML IJ SOLN
INTRAMUSCULAR | Status: DC | PRN
  Administered 2023-08-23: 2 mg via INTRAVENOUS

## 2023-08-23 MED ORDER — PHENYLEPHRINE HCL-NACL 20-0.9 MG/250ML-% IV SOLN
INTRAVENOUS | Status: DC | PRN
  Administered 2023-08-23: 30 ug/min via INTRAVENOUS

## 2023-08-23 MED ORDER — CEFAZOLIN SODIUM-DEXTROSE 2-4 GM/100ML-% IV SOLN
2.0000 g | INTRAVENOUS | Status: AC
  Administered 2023-08-23: 2 g via INTRAVENOUS
  Filled 2023-08-23: qty 100

## 2023-08-23 MED ORDER — DEXAMETHASONE SODIUM PHOSPHATE 10 MG/ML IJ SOLN
INTRAMUSCULAR | Status: DC | PRN
  Administered 2023-08-23: 5 mg via INTRAVENOUS

## 2023-08-23 MED ORDER — ROCURONIUM BROMIDE 10 MG/ML (PF) SYRINGE
PREFILLED_SYRINGE | INTRAVENOUS | Status: AC
  Filled 2023-08-23: qty 10

## 2023-08-23 MED ORDER — ONDANSETRON HCL 4 MG/2ML IJ SOLN: 4.0000 mg | Freq: Once | INTRAMUSCULAR | Status: DC | PRN

## 2023-08-23 MED ORDER — LACTATED RINGERS IV SOLN: INTRAVENOUS | Status: DC

## 2023-08-23 MED ORDER — DEXAMETHASONE SODIUM PHOSPHATE 10 MG/ML IJ SOLN
INTRAMUSCULAR | Status: DC | PRN
  Administered 2023-08-23: 10 mg

## 2023-08-23 SURGICAL SUPPLY — 67 items
BAG COUNTER SPONGE SURGICOUNT (BAG) ×1 IMPLANT
BIT DRILL 2.5 X LONG (BIT) ×1 IMPLANT
BIT DRILL 2.8MM (BIT) ×1 IMPLANT
BIT DRILL 2.8X165 (BIT) ×1 IMPLANT
BIT DRILL CANN QC 2.8X165 (BIT) IMPLANT
BIT DRILL X LONG 2.5 (BIT) IMPLANT
BNDG COHESIVE 4X5 TAN STRL LF (GAUZE/BANDAGES/DRESSINGS) ×1 IMPLANT
BNDG ELASTIC 3INX 5YD STR LF (GAUZE/BANDAGES/DRESSINGS) IMPLANT
BNDG ELASTIC 4INX 5YD STR LF (GAUZE/BANDAGES/DRESSINGS) IMPLANT
BNDG ELASTIC 4X5.8 VLCR STR LF (GAUZE/BANDAGES/DRESSINGS) ×1 IMPLANT
BNDG ELASTIC 6INX 5YD STR LF (GAUZE/BANDAGES/DRESSINGS) ×1 IMPLANT
BRUSH SCRUB EZ PLAIN DRY (MISCELLANEOUS) ×2 IMPLANT
CHLORAPREP W/TINT 26 (MISCELLANEOUS) ×1 IMPLANT
CNTNR URN SCR LID CUP LEK RST (MISCELLANEOUS) IMPLANT
COVER SURGICAL LIGHT HANDLE (MISCELLANEOUS) ×2 IMPLANT
DERMABOND ADVANCED .7 DNX12 (GAUZE/BANDAGES/DRESSINGS) ×2 IMPLANT
DRAPE C-ARM 42X72 X-RAY (DRAPES) ×1 IMPLANT
DRAPE INCISE IOBAN 66X45 STRL (DRAPES) ×1 IMPLANT
DRAPE SURG 17X23 STRL (DRAPES) ×1 IMPLANT
DRAPE SURG ORHT 6 SPLT 77X108 (DRAPES) ×2 IMPLANT
DRAPE U-SHAPE 47X51 STRL (DRAPES) ×2 IMPLANT
DRSG MEPILEX POST OP 4X8 (GAUZE/BANDAGES/DRESSINGS) ×1 IMPLANT
ELECT REM PT RETURN 9FT ADLT (ELECTROSURGICAL) ×1 IMPLANT
ELECTRODE REM PT RTRN 9FT ADLT (ELECTROSURGICAL) ×1 IMPLANT
EVACUATOR 1/8 PVC DRAIN (DRAIN) IMPLANT
GAUZE PAD ABD 8X10 STRL (GAUZE/BANDAGES/DRESSINGS) ×1 IMPLANT
GLOVE BIO SURGEON STRL SZ 6.5 (GLOVE) ×3 IMPLANT
GLOVE BIO SURGEON STRL SZ7.5 (GLOVE) ×4 IMPLANT
GLOVE BIOGEL PI IND STRL 6.5 (GLOVE) ×1 IMPLANT
GLOVE BIOGEL PI IND STRL 7.5 (GLOVE) ×1 IMPLANT
GOWN STRL REUS W/ TWL LRG LVL3 (GOWN DISPOSABLE) ×2 IMPLANT
K-WIRE 1.6X150 (WIRE) ×1 IMPLANT
KIT BASIN OR (CUSTOM PROCEDURE TRAY) ×1 IMPLANT
KIT TURNOVER KIT B (KITS) ×1 IMPLANT
KWIRE 1.6X150 (WIRE) IMPLANT
MANIFOLD NEPTUNE II (INSTRUMENTS) ×1 IMPLANT
NDL HYPO 25X1 1.5 SAFETY (NEEDLE) ×1 IMPLANT
NEEDLE HYPO 25X1 1.5 SAFETY (NEEDLE) ×1 IMPLANT
NS IRRIG 1000ML POUR BTL (IV SOLUTION) ×1 IMPLANT
PACK ORTHO EXTREMITY (CUSTOM PROCEDURE TRAY) ×1 IMPLANT
PAD ARMBOARD POSITIONER FOAM (MISCELLANEOUS) ×2 IMPLANT
PAD CAST 3X4 CTTN HI CHSV (CAST SUPPLIES) IMPLANT
PAD CAST 4YDX4 CTTN HI CHSV (CAST SUPPLIES) IMPLANT
PROS LCP PLATE 9H 124M (Plate) ×1 IMPLANT
PROSTHESIS LCP PLATE 9H 124M (Plate) IMPLANT
SCREW LOCK CORT ST 3.5X26 (Screw) IMPLANT
SCREW LOCK CORT ST 3.5X28 (Screw) IMPLANT
SCREW LOCK T15 FT 24X3.5X2.9X (Screw) IMPLANT
SCREW LOCK T15 FT 28X3.5X2.9X (Screw) IMPLANT
SCREW LOCK T15 FT 30X3.5X2.9X (Screw) IMPLANT
SLING ARM FOAM STRAP MED (SOFTGOODS) IMPLANT
SPONGE T-LAP 18X18 ~~LOC~~+RFID (SPONGE) ×1 IMPLANT
STAPLER VISISTAT 35W (STAPLE) ×1 IMPLANT
SUCTION TUBE FRAZIER 10FR DISP (SUCTIONS) ×1 IMPLANT
SUT ETHILON 3 0 PS 1 (SUTURE) ×2 IMPLANT
SUT MNCRL AB 3-0 PS2 18 (SUTURE) ×1 IMPLANT
SUT MON AB 2-0 CT2 27 (SUTURE) IMPLANT
SUT PROLENE 0 CT (SUTURE) IMPLANT
SUT VIC AB 0 CT1 27XBRD ANBCTR (SUTURE) ×2 IMPLANT
SUT VIC AB 2-0 CT1 TAPERPNT 27 (SUTURE) ×2 IMPLANT
SYR CONTROL 10ML LL (SYRINGE) ×1 IMPLANT
TOWEL GREEN STERILE (TOWEL DISPOSABLE) ×3 IMPLANT
TOWEL GREEN STERILE FF (TOWEL DISPOSABLE) ×1 IMPLANT
TRAY FOLEY MTR SLVR 16FR STAT (SET/KITS/TRAYS/PACK) IMPLANT
TUBE CONNECTING 12X1/4 (SUCTIONS) ×1 IMPLANT
WATER STERILE IRR 1000ML POUR (IV SOLUTION) ×1 IMPLANT
YANKAUER SUCT BULB TIP NO VENT (SUCTIONS) IMPLANT

## 2023-08-23 NOTE — Op Note (Signed)
 Orthopaedic Surgery Operative Note (CSN: [id number] ) Date of Surgery: 08/23/2023  Admit Date: 08/23/2023   Diagnoses: Pre-Op Diagnoses: Right humeral shaft nonunion   Post-Op Diagnosis: Same  Procedures: CPT 24430-Open reduction internal fixation of right humeral shaft nonunion  Surgeons : Primary: Roby Lofts, MD  Assistant: Thyra Breed, PA-C  Location: OR 5   Anesthesia: General   Antibiotics: Ancef 2g preop with with vancomycin powder   Tourniquet time: None    Estimated Blood Loss:150 mL  Complications: None  Specimens:* No specimens in log *   Implants: Implant Name Type Inv. Item Serial No. Manufacturer Lot No. LRB No. Used Action  SCREW LOCK CORT ST 3.5X28 - [id number] Screw SCREW LOCK CORT ST 3.5X28  [employer]  Right 2 Implanted  SCREW LOCK CORT ST 3.5X26 - [id number] Screw SCREW LOCK CORT ST 3.5X26  [employer]  Right 2 Implanted  SCREW LOCK T15 FT 30X3.5X2.9X - [id number] Screw SCREW LOCK T15 FT 30X3.5X2.9X  [employer]  Right 2 Implanted  SCREW LOCK T15 FT 28X3.5X2.9X - [id number] Screw SCREW LOCK T15 FT 28X3.5X2.9X  [employer]  Right 2 Implanted  SCREW LOCK T15 FT 24X3.5X2.9X - [id number] Screw SCREW LOCK T15 FT 24X3.5X2.9X  [employer]  Right 1 Implanted  PROS LCP PLATE 9H 403K - [id number] Plate PROS LCP PLATE 9H 564P  [employer]  Right 1 Implanted     Indications for Surgery: 63 year old female who sustained a right humeral shaft fracture in July 2024.  She was initially treated nonoperatively and she developed a nonunion.  She continued to have symptomatic instability with continued pain.  She developed a pseudoarthrosis.  I recommend proceeding to the operating room for repair of her right humeral shaft nonunion.  Risks and benefits were discussed with the patient.  Risks include but not limited to bleeding, infection, persistent nonunion, nerve and blood vessel injury, shoulder stiffness, even possibility  of sutures.  She agreed to proceed with surgery and consent was obtained.  Operative Findings: 1.  Right humeral shaft nonunion with pseudoarthrosis treated with open reduction internal fixation using Synthes 3.5 mm LCP plate  Procedure: The patient was identified in the preoperative holding area. Consent was confirmed with the patient and their family and all questions were answered. The operative extremity was marked after confirmation with the patient. she was then brought back to the operating room by our anesthesia colleagues.  She was carefully transferred over to radiolucent flattop table.  She was placed under general anesthetic.  The right upper extremity was then prepped and draped in usual sterile fashion.  A timeout was performed to verify the patient, the procedure, and the extremity.  Preoperative antibiotics were dosed.  Fluoroscopic imaging showed the unstable nature of her injury.  A anterior lateral approach to the humerus was made and carried down through skin subcutaneous tissue.  Identified the cephalic vein and mobilized this medially.  I split the brachialis in line with my incision and developed the interval proximally between the deltoid and the pectoralis major.  There is significant muscle atrophy of the biceps.  I then incised through the pseudocapsule to expose the nonunion site.  I took some cultures to send to the lab to make sure there was no infection.  It did not grossly look infected or purulent.  I then proceeded to debride the bone edges to get back to healthy bleeding bone.  I used a curette and a 2.5 mm drill bit to drill into the medullary canal  both proximally and distally.  Once I had the bone ends debrided I then made a unicortical drill holes proximally and distally and used a reduction tenaculum to anatomically reduce the nonunion site with good alignment and significant compression.  I then placed a 9 hole Synthes 3.5 mm LCP plate along the anterior surface of  the humerus.  I drilled and placed nonlocking screws proximal and distal to the fracture and confirmed positioning with fluoroscopy.  I then proceeded to place another nonlocking screw proximally.  The bone quality was not great so then the remainder of the screws were made locking.  I had excellent fixation after I was done and final fluoroscopic imaging was obtained.  The incision was copiously irrigated.  A gram of vancomycin powder was placed into the incision.  A layered closure of 0 Vicryl, 2-0 Monocryl and 3-0 nylon was used to close the skin.  Sterile dressings were applied.  The patient was then awoke from anesthesia and taken to the PACU in stable condition.  Post Op Plan/Instructions: The patient be nonweightbearing to the right upper extremity.  She will receive no DVT prophylaxis as she is ambulatory upper extremity patient.  She will be without range of motion restrictions.  She will discharge home from the PACU.  I was present and performed the entire surgery.  Thyra Breed, PA-C did assist me throughout the case. An assistant was necessary given the difficulty in approach, maintenance of reduction and ability to instrument the fracture.   Truitt Merle, MD [facility]

## 2023-08-23 NOTE — Transfer of Care (Signed)
 Immediate Anesthesia Transfer of Care Note  Patient: Christine Garcia  Procedure(s) Performed: OPEN REDUCTION INTERNAL FIXATION (ORIF) HUMERAL SHAFT FRACTURE (Right)  Patient Location: PACU  Anesthesia Type:General and Regional  Level of Consciousness: awake, alert , and oriented  Airway & Oxygen Therapy: Patient Spontanous Breathing and Patient connected to face mask oxygen  Post-op Assessment: Report given to RN and Post -op Vital signs reviewed and stable  Post vital signs: Reviewed and stable  Last Vitals:  Vitals Value Taken Time  BP 125/85 08/23/23 1606  Temp 36.7 C 08/23/23 1606  Pulse 88 08/23/23 1613  Resp 18 08/23/23 1613  SpO2 91 % 08/23/23 1613  Vitals shown include unfiled device data.  Last Pain:  Vitals:   08/23/23 1606  TempSrc:   PainSc: 0-No pain      Patients Stated Pain Goal: 4 (08/23/23 1610)  Complications: No notable events documented.

## 2023-08-23 NOTE — Progress Notes (Signed)
 Notified Dr. Ace Gins of patient eating a saltine cracker at 0600 this morning. Will delay surgery by 6 hours per anesthesia. Dr. Jena Gauss made aware and case moved to 1:30pm start time.

## 2023-08-23 NOTE — Anesthesia Procedure Notes (Addendum)
 Procedure Name: LMA Insertion Date/Time: 08/23/2023 2:41 PM  Performed by: Darlina Guys, CRNAPre-anesthesia Checklist: Patient identified, Emergency Drugs available, Suction available and Patient being monitored Patient Re-evaluated:Patient Re-evaluated prior to induction Oxygen Delivery Method: Circle System Utilized Preoxygenation: Pre-oxygenation with 100% oxygen Induction Type: IV induction Ventilation: Mask ventilation without difficulty LMA: LMA inserted LMA Size: 3.0 Number of attempts: 1 Placement Confirmation: positive ETCO2 Tube secured with: Tape Dental Injury: Teeth and Oropharynx as per pre-operative assessment  Comments: Patient with baseline issues with lying flat d/t kyphosis. CRNA very careful to support head and neck in comfortable and ergonomic position for induction. Atraumatic induction/LMA insertion. Patient with very poor dentition preoperatively. Dentition and oral mucosa as per preop- no damage from anesthesia.

## 2023-08-23 NOTE — Anesthesia Procedure Notes (Signed)
 Anesthesia Regional Block: Supraclavicular block   Pre-Anesthetic Checklist: , timeout performed,  Correct Patient, Correct Site, Correct Laterality,  Correct Procedure, Correct Position, site marked,  Risks and benefits discussed,  Surgical consent,  Pre-op evaluation,  At surgeon's request and post-op pain management  Laterality: Right  Prep: Maximum Sterile Barrier Precautions used, chloraprep       Needles:  Injection technique: Single-shot  Needle Type: Echogenic Needle      Needle Gauge: 20     Additional Needles:   Procedures:,,,, ultrasound used (permanent image in chart),,    Narrative:  Start time: 08/23/2023 12:31 PM End time: 08/23/2023 12:36 PM Injection made incrementally with aspirations every 5 mL.  Performed by: Personally  Anesthesiologist: Mariann Barter, MD

## 2023-08-23 NOTE — Interval H&P Note (Signed)
 History and Physical Interval Note:  08/23/2023 10:54 AM  Christine Garcia  has presented today for surgery, with the diagnosis of Right humeral nonunion.  The various methods of treatment have been discussed with the patient and family. After consideration of risks, benefits and other options for treatment, the patient has consented to  Procedure(s): OPEN REDUCTION INTERNAL FIXATION (ORIF) HUMERAL SHAFT FRACTURE (Right) as a surgical intervention.  The patient's history has been reviewed, patient examined, no change in status, stable for surgery.  I have reviewed the patient's chart and labs.  Questions were answered to the patient's satisfaction.     Caryn Bee P Javonne Dorko

## 2023-08-23 NOTE — Discharge Instructions (Addendum)
 Orthopaedic Trauma Service Discharge Instructions   General Discharge Instructions  WEIGHT BEARING STATUS:Non-weightbearing right upper extremity  RANGE OF MOTION/ACTIVITY: Ok for shoulder and elbow motion as tolerated. Use sling as needed for comfort  Wound Care: You may remove your surgical dressing on post op day 2 (Monday 08/26/23). Incisions can be left open to air if there is no drainage. Once the incision is completely dry and without drainage, it may be left open to air out.  Showering may begin post op day 4 (Tuesday 08/27/23).  Clean incision gently with soap and water.  DVT/PE prophylaxis:  Resume Eliquis on 08/24/23  Diet: as you were eating previously.  Can use over the counter stool softeners and bowel preparations, such as Miralax, to help with bowel movements.  Narcotics can be constipating.  Be sure to drink plenty of fluids  PAIN MEDICATION USE AND EXPECTATIONS  You have likely been given narcotic medications to help control your pain.  After a traumatic event that results in an fracture (broken bone) with or without surgery, it is ok to use narcotic pain medications to help control one's pain.  We understand that everyone responds to pain differently and each individual patient will be evaluated on a regular basis for the continued need for narcotic medications. Ideally, narcotic medication use should last no more than 6-8 weeks (coinciding with fracture healing).   As a patient it is your responsibility as well to monitor narcotic medication use and report the amount and frequency you use these medications when you come to your office visit.   We would also advise that if you are using narcotic medications, you should take a dose prior to therapy to maximize you participation.  IF YOU ARE ON NARCOTIC MEDICATIONS IT IS NOT PERMISSIBLE TO OPERATE A MOTOR VEHICLE (MOTORCYCLE/CAR/TRUCK/MOPED) OR HEAVY MACHINERY DO NOT MIX NARCOTICS WITH OTHER CNS (CENTRAL NERVOUS SYSTEM)  DEPRESSANTS SUCH AS ALCOHOL  POST-OPERATIVE OPIOID TAPER INSTRUCTIONS: It is important to wean off of your opioid medication as soon as possible. If you do not need pain medication after your surgery it is ok to stop day one. Opioids include: Codeine, Hydrocodone(Norco, Vicodin), Oxycodone(Percocet, oxycontin) and hydromorphone amongst others.  Long term and even short term use of opiods can cause: Increased pain response Dependence Constipation Depression Respiratory depression And more.  Withdrawal symptoms can include Flu like symptoms Nausea, vomiting And more Techniques to manage these symptoms Hydrate well Eat regular healthy meals Stay active Use relaxation techniques(deep breathing, meditating, yoga) Do Not substitute Alcohol to help with tapering If you have been on opioids for less than two weeks and do not have pain than it is ok to stop all together.  Plan to wean off of opioids This plan should start within one week post op of your fracture surgery  Maintain the same interval or time between taking each dose and first decrease the dose.  Cut the total daily intake of opioids by one tablet each day Next start to increase the time between doses. The last dose that should be eliminated is the evening dose.    STOP SMOKING OR USING NICOTINE PRODUCTS!!!!  As discussed nicotine severely impairs your body's ability to heal surgical and traumatic wounds but also impairs bone healing.  Wounds and bone heal by forming microscopic blood vessels (angiogenesis) and nicotine is a vasoconstrictor (essentially, shrinks blood vessels).  Therefore, if vasoconstriction occurs to these microscopic blood vessels they essentially disappear and are unable to deliver necessary nutrients to the  healing tissue.  This is one modifiable factor that you can do to dramatically increase your chances of healing your injury.  (This means no smoking, no nicotine gum, patches, etc)  DO NOT USE  NONSTEROIDAL ANTI-INFLAMMATORY DRUGS (NSAID'S)  Using products such as Advil (ibuprofen), Aleve (naproxen), Motrin (ibuprofen) for additional pain control during fracture healing can delay and/or prevent the healing response.  If you would like to take over the counter (OTC) medication, Tylenol (acetaminophen) is ok.  However, some narcotic medications that are given for pain control contain acetaminophen as well. Therefore, you should not exceed more than 4000 mg of tylenol in a day if you do not have liver disease.  Also note that there are may OTC medicines, such as cold medicines and allergy medicines that my contain tylenol as well.  If you have any questions about medications and/or interactions please ask your doctor/PA or your pharmacist.      ICE AND ELEVATE INJURED/OPERATIVE EXTREMITY  Using ice and elevating the injured extremity above your heart can help with swelling and pain control.  Icing in a pulsatile fashion, such as 20 minutes on and 20 minutes off, can be followed.    Do not place ice directly on skin. Make sure there is a barrier between to skin and the ice pack.    Using frozen items such as frozen peas works well as the conform nicely to the are that needs to be iced.  USE AN ACE WRAP OR TED HOSE FOR SWELLING CONTROL  In addition to icing and elevation, Ace wraps or TED hose are used to help limit and resolve swelling.  It is recommended to use Ace wraps or TED hose until you are informed to stop.    When using Ace Wraps start the wrapping distally (farthest away from the body) and wrap proximally (closer to the body)   Example: If you had surgery on your leg or thing and you do not have a splint on, start the ace wrap at the toes and work your way up to the thigh        If you had surgery on your upper extremity and do not have a splint on, start the ace wrap at your fingers and work your way up to the upper arm   CALL THE OFFICE FOR MEDICATION REFILLS OR WITH ANY  QUESTIONS/CONCERNS: [phone]   VISIT OUR WEBSITE FOR ADDITIONAL INFORMATION: [email or web address]    Discharge Wound Care Instructions  Do NOT apply any ointments, solutions or lotions to pin sites or surgical wounds.  These prevent needed drainage and even though solutions like hydrogen peroxide kill bacteria, they also damage cells lining the pin sites that help fight infection.  Applying lotions or ointments can keep the wounds moist and can cause them to breakdown and open up as well. This can increase the risk for infection. When in doubt call the office.  Surgical incisions should be dressed daily.  If any drainage is noted, use one layer of adaptic or Mepitel, then gauze, Kerlix, and an ace wrap. - These dressing supplies should be available at local medical supply stores [facility], [facility], etc) as well as Insurance claims handler ([facility], [facility], [place], etc)  Once the incision is completely dry and without drainage, it may be left open to air out.  Showering may begin 36-48 hours later.  Cleaning gently with soap and water.  Traumatic wounds should be dressed daily as well.    One layer of adaptic, gauze,  Kerlix, then ace wrap.  The adaptic can be discontinued once the draining has ceased    If you have a wet to dry dressing: wet the gauze with saline the squeeze as much saline out so the gauze is moist (not soaking wet), place moistened gauze over wound, then place a dry gauze over the moist one, followed by Kerlix wrap, then ace wrap.    Call office for the following: Temperature greater than 101F Persistent nausea and vomiting Severe uncontrolled pain Redness, tenderness, or signs of infection (pain, swelling, redness, odor or green/yellow discharge around the site) Difficulty breathing, headache or visual disturbances Hives Persistent dizziness or light-headedness Extreme fatigue Any other questions or concerns you may have after discharge  In an emergency,  call 911 or go to an Emergency Department at a nearby hospital  OTHER HELPFUL INFORMATION  If you had a block, it will wear off between 8-24 hrs postop typically.  This is period when your pain may go from nearly zero to the pain you would have had postop without the block.  This is an abrupt transition but nothing dangerous is happening.  You may take an extra dose of narcotic when this happens.  You should wean off your narcotic medicines as soon as you are able.  Most patients will be off or using minimal narcotics before their first postop appointment.   We suggest you use the pain medication the first night prior to going to bed, in order to ease any pain when the anesthesia wears off. You should avoid taking pain medications on an empty stomach as it will make you nauseous.  Do not drink alcoholic beverages or take illicit drugs when taking pain medications.  In most states it is against the law to drive while you are in a splint or sling.  And certainly against the law to drive while taking narcotics.  You may return to work/school in the next couple of days when you feel up to it.   Pain medication may make you constipated.  Below are a few solutions to try in this order: Decrease the amount of pain medication if you aren't having pain. Drink lots of decaffeinated fluids. Drink prune juice and/or each dried prunes  If the first 3 don't work start with additional solutions Take Colace - an over-the-counter stool softener Take Senokot - an over-the-counter laxative Take Miralax - a stronger over-the-counter laxative

## 2023-08-26 ENCOUNTER — Other Ambulatory Visit: Payer: Self-pay | Admitting: Family Medicine

## 2023-08-26 ENCOUNTER — Encounter (HOSPITAL_COMMUNITY): Payer: Self-pay | Admitting: Student

## 2023-08-26 NOTE — Anesthesia Postprocedure Evaluation (Signed)
 Anesthesia Post Note  Patient: Christine Garcia  Procedure(s) Performed: OPEN REDUCTION INTERNAL FIXATION (ORIF) HUMERAL SHAFT FRACTURE (Right)     Patient location during evaluation: PACU Anesthesia Type: Regional and General Level of consciousness: awake and alert Pain management: pain level controlled Vital Signs Assessment: post-procedure vital signs reviewed and stable Respiratory status: spontaneous breathing, nonlabored ventilation, respiratory function stable and patient connected to nasal cannula oxygen Cardiovascular status: blood pressure returned to baseline and stable Postop Assessment: no apparent nausea or vomiting Anesthetic complications: no   No notable events documented.  Last Vitals:  Vitals:   08/23/23 1615 08/23/23 1630  BP: 105/67 (!) 108/97  Pulse: 81 88  Resp: 20 20  Temp:  36.8 C  SpO2: 95% 95%    Last Pain:  Vitals:   08/23/23 1630  TempSrc:   PainSc: 0-No pain                 Loyal Holzheimer S

## 2023-08-27 ENCOUNTER — Other Ambulatory Visit: Payer: Self-pay | Admitting: Family Medicine

## 2023-08-27 DIAGNOSIS — F3342 Major depressive disorder, recurrent, in full remission: Secondary | ICD-10-CM

## 2023-08-27 DIAGNOSIS — F409 Phobic anxiety disorder, unspecified: Secondary | ICD-10-CM

## 2023-08-27 DIAGNOSIS — F419 Anxiety disorder, unspecified: Secondary | ICD-10-CM

## 2023-08-27 NOTE — Telephone Encounter (Signed)
 Requested medication (s) are due for refill today:   Requested medication (s) are on the active medication list: No  Last refill:    Future visit scheduled: Yes  Notes to clinic:  Not on medication list.    Requested Prescriptions  Pending Prescriptions Disp Refills   furosemide (LASIX) 20 MG tablet [Pharmacy Med Name: FUROSEMIDE 20 MG TABLET] 90 tablet     Sig: TAKE 1 TABLET BY MOUTH DAILY     Cardiovascular:  Diuretics - Loop Failed - 08/27/2023  3:17 PM      Failed - Cl in normal range and within 180 days    Chloride  Date Value Ref Range Status  08/13/2023 91 (L) 96 - 106 mmol/L Final         Failed - Mg Level in normal range and within 180 days    Magnesium  Date Value Ref Range Status  11/15/2011 2.1 1.5 - 2.5 mg/dL Final         Failed - Last BP in normal range    BP Readings from Last 1 Encounters:  08/23/23 (!) 108/97         Passed - K in normal range and within 180 days    Potassium  Date Value Ref Range Status  08/13/2023 4.6 3.5 - 5.2 mmol/L Final         Passed - Ca in normal range and within 180 days    Calcium  Date Value Ref Range Status  08/13/2023 9.6 8.7 - 10.3 mg/dL Final   Calcium, Ion  Date Value Ref Range Status  05/11/2010 1.10 (L) 1.12 - 1.32 mmol/L Final         Passed - Na in normal range and within 180 days    Sodium  Date Value Ref Range Status  08/13/2023 135 134 - 144 mmol/L Final         Passed - Cr in normal range and within 180 days    Creatinine, Ser  Date Value Ref Range Status  08/13/2023 0.75 0.57 - 1.00 mg/dL Final         Passed - Valid encounter within last 6 months    Recent Outpatient Visits           3 weeks ago Essential hypertension   [facility] [facility] Simmons-Robinson, Tawanna Cooler, MD       Future Appointments             In 2 weeks Simmons-Robinson, Tawanna Cooler, MD [facility]   In 2 months End, Cristal Deer, MD [facility]              omeprazole (PRILOSEC) 20 MG capsule [Pharmacy Med Name: OMEPRAZOLE DR 20 MG CAPSULE] 90 capsule     Sig: TAKE 1 CAPSULE BY MOUTH DAILY     Gastroenterology: Proton Pump Inhibitors Passed - 08/27/2023  3:17 PM      Passed - Valid encounter within last 12 months    Recent Outpatient Visits           3 weeks ago Essential hypertension   [facility] Simmons-Robinson, Tawanna Cooler, MD       Future Appointments             In 2 weeks Simmons-Robinson, Tawanna Cooler, MD [facility], [facility]   In 2 months End, Cristal Deer, MD [facility]

## 2023-08-27 NOTE — Telephone Encounter (Signed)
 Copied from CRM [id number]. Topic: Clinical - Medication Refill >> Aug 27, 2023  3:00 PM Santiya F wrote: Most Recent Primary Care Visit:  Provider: Mimi Alt  Department: [facility]  Visit Type: NEW PATIENT  Date: 07/31/2023  Medication: hydrOXYzine (ATARAX) 25 MG tablet [[id number]], DULoxetine (CYMBALTA) 20 MG capsule [[id number]]  Has the patient contacted their pharmacy? Yes  (Agent: If yes, when and what did the pharmacy advise?) Contact office, pharmacy also sent request but hadn't heard  back.   Is this the correct pharmacy for this prescription? Yes  This is the patient's preferred pharmacy:  [id number] [place], [place] - [address] [place] [address] Phone: [phone] Fax: [phone]   Has the prescription been filled recently? Yes  Is the patient out of the medication? Yes  Has the patient been seen for an appointment in the last year OR does the patient have an upcoming appointment? Yes   Patients says the pharmacy has sent requests for both medications but hasn't heard back. Patient says there is no medication at the pharmacy. Patient is requesting a call when the medication is sent.   Can we respond through MyChart? Yes  Agent: Please be advised that Rx refills may take up to 3 business days. We ask that you follow-up with your pharmacy.

## 2023-08-28 ENCOUNTER — Other Ambulatory Visit: Payer: Self-pay | Admitting: Family Medicine

## 2023-08-28 LAB — AEROBIC/ANAEROBIC CULTURE W GRAM STAIN (SURGICAL/DEEP WOUND)
Culture: NO GROWTH
Gram Stain: NONE SEEN

## 2023-08-28 NOTE — Telephone Encounter (Signed)
 Requested medication (s) are due for refill today: routing for review  Requested medication (s) are on the active medication list: no  Last refill:  06/04/15  Future visit scheduled: yes  Notes to clinic:  Unable to refill per protocol, last refill by another/historical provider.      Requested Prescriptions  Pending Prescriptions Disp Refills   DULoxetine (CYMBALTA) 20 MG capsule 60 capsule 0    Sig: Take 20 mg by mouth once a day for one week. Then increase to 20 mg twice a day and stay at that dose thereafter.     Psychiatry: Antidepressants - SNRI - duloxetine Failed - 08/28/2023  3:44 PM      Failed - Last BP in normal range    BP Readings from Last 1 Encounters:  08/23/23 (!) 108/97         Passed - Cr in normal range and within 360 days    Creatinine, Ser  Date Value Ref Range Status  08/13/2023 0.75 0.57 - 1.00 mg/dL Final         Passed - eGFR is 30 or above and within 360 days    GFR calc Af Amer  Date Value Ref Range Status  01/11/2015 >60 >60 mL/min Final    Comment:    (NOTE) The eGFR has been calculated using the CKD EPI equation. This calculation has not been validated in all clinical situations. eGFR's persistently <60 mL/min signify possible Chronic Kidney Disease.    GFR calc non Af Amer  Date Value Ref Range Status  01/11/2015 >60 >60 mL/min Final   GFR  Date Value Ref Range Status  12/30/2014 52.81 (L) >60.00 mL/min Final   eGFR  Date Value Ref Range Status  08/13/2023 90 >59 mL/min/1.73 Final         Passed - Completed PHQ-2 or PHQ-9 in the last 360 days      Passed - Valid encounter within last 6 months    Recent Outpatient Visits           4 weeks ago Essential hypertension   [place], [place], MD       Future Appointments             In 1 week Simmons-Robinson, Makiera, MD [facility]   In 2 months End, Cristal Deer, MD [place] [facility]  [facility] Prescriptions Disp Refills   hydrOXYzine (ATARAX) 25 MG tablet 120 tablet 1    Sig: Take 1 tablet (25 mg total) by mouth every 6 (six) hours as needed.     Ear, Nose, and Throat:  Antihistamines 2 Passed - 08/28/2023  3:44 PM      Passed - Cr in normal range and within 360 days    Creatinine, Ser  Date Value Ref Range Status  08/13/2023 0.75 0.57 - 1.00 mg/dL Final         Passed - Valid encounter within last 12 months    Recent Outpatient Visits           4 weeks ago Essential hypertension   [facility] Simmons-Robinson, Tawanna Cooler, MD       Future Appointments             In 1 week Simmons-Robinson, Tawanna Cooler, MD [facility], [facility]   In 2 months End, Cristal Deer, MD [facility] at [facility]

## 2023-08-28 NOTE — Telephone Encounter (Signed)
 Duplicate request, lasted refilled 08/16/23.  Requested Prescriptions  Pending Prescriptions Disp Refills   hydrOXYzine (ATARAX) 25 MG tablet 120 tablet 1    Sig: Take 1 tablet (25 mg total) by mouth every 6 (six) hours as needed.     Ear, Nose, and Throat:  Antihistamines 2 Passed - 08/28/2023  3:44 PM      Passed - Cr in normal range and within 360 days    Creatinine, Ser  Date Value Ref Range Status  08/13/2023 0.75 0.57 - 1.00 mg/dL Final         Passed - Valid encounter within last 12 months    Recent Outpatient Visits           4 weeks ago Essential hypertension   [place] [facility] Simmons-Robinson, Judyann Number, MD       Future Appointments             In 1 week Simmons-Robinson, Makiera, MD [facility], [facility]   In 2 months End, Veryl Gottron, MD [place] [facility]             DULoxetine (CYMBALTA) 20 MG capsule 60 capsule 0    Sig: Take 20 mg by mouth once a day for one week. Then increase to 20 mg twice a day and stay at that dose thereafter.     Psychiatry: Antidepressants - SNRI - duloxetine Failed - 08/28/2023  3:44 PM      Failed - Last BP in normal range    BP Readings from Last 1 Encounters:  08/23/23 (!) 108/97         Passed - Cr in normal range and within 360 days    Creatinine, Ser  Date Value Ref Range Status  08/13/2023 0.75 0.57 - 1.00 mg/dL Final         Passed - eGFR is 30 or above and within 360 days    GFR calc Af Amer  Date Value Ref Range Status  01/11/2015 >60 >60 mL/min Final    Comment:    (NOTE) The eGFR has been calculated using the CKD EPI equation. This calculation has not been validated in all clinical situations. eGFR's persistently <60 mL/min signify possible Chronic Kidney Disease.    GFR calc non Af Amer  Date Value Ref Range Status  01/11/2015 >60 >60 mL/min Final   GFR  Date Value Ref Range Status  12/30/2014 52.81 (L) >60.00 mL/min Final   eGFR  Date  Value Ref Range Status  08/13/2023 90 >59 mL/min/1.73 Final         Passed - Completed PHQ-2 or PHQ-9 in the last 360 days      Passed - Valid encounter within last 6 months    Recent Outpatient Visits           4 weeks ago Essential hypertension    [facility] Simmons-Robinson, Judyann Number, MD       Future Appointments             In 1 week Simmons-Robinson, Judyann Number, MD [facility]   In 2 months End, Veryl Gottron, MD [facility] at [facility]

## 2023-08-29 ENCOUNTER — Encounter: Payer: Self-pay | Admitting: Family Medicine

## 2023-08-29 ENCOUNTER — Other Ambulatory Visit: Payer: Self-pay | Admitting: Family Medicine

## 2023-08-29 ENCOUNTER — Encounter (HOSPITAL_COMMUNITY): Payer: Self-pay | Admitting: Student

## 2023-08-29 DIAGNOSIS — F409 Phobic anxiety disorder, unspecified: Secondary | ICD-10-CM

## 2023-08-29 DIAGNOSIS — F419 Anxiety disorder, unspecified: Secondary | ICD-10-CM

## 2023-08-30 MED ORDER — DULOXETINE HCL 30 MG PO CPEP: 30.0000 mg | ORAL_CAPSULE | Freq: Every day | ORAL | 6 refills | Status: DC

## 2023-08-30 NOTE — Telephone Encounter (Signed)
 Duloxetine  30mg  daily sent to patient's pharmacy on file

## 2023-09-02 ENCOUNTER — Ambulatory Visit: Payer: Self-pay

## 2023-09-02 DIAGNOSIS — S42301K Unspecified fracture of shaft of humerus, right arm, subsequent encounter for fracture with nonunion: Secondary | ICD-10-CM | POA: Diagnosis not present

## 2023-09-03 ENCOUNTER — Other Ambulatory Visit: Payer: Self-pay | Admitting: Family Medicine

## 2023-09-03 ENCOUNTER — Encounter: Payer: Self-pay | Admitting: Family Medicine

## 2023-09-03 DIAGNOSIS — F419 Anxiety disorder, unspecified: Secondary | ICD-10-CM

## 2023-09-03 DIAGNOSIS — G629 Polyneuropathy, unspecified: Secondary | ICD-10-CM

## 2023-09-03 MED ORDER — DULOXETINE HCL 60 MG PO CPEP: 60.0000 mg | ORAL_CAPSULE | Freq: Every day | ORAL | 6 refills | Status: DC

## 2023-09-03 NOTE — Telephone Encounter (Signed)
 Updated duloxetine  dose to 60mg  daily. New prescription sent for 30 day  supply with 6 refills

## 2023-09-09 ENCOUNTER — Ambulatory Visit

## 2023-09-09 ENCOUNTER — Ambulatory Visit: Admission: RE | Admit: 2023-09-09 | Source: Ambulatory Visit

## 2023-09-09 ENCOUNTER — Telehealth: Payer: Self-pay | Admitting: Physician Assistant

## 2023-09-09 NOTE — Telephone Encounter (Signed)
 Patient canceled her MRI's for today. Wants to see Ruthann Cover in person to discuss her MRI. Scheduled for next Monday.

## 2023-09-09 NOTE — Telephone Encounter (Signed)
 Patient left a call with the answering service. I called her to get further information and she is requesting a call back from [place] regarding her MRI's.      Media Information  Document Information  AMB Correspondence  NEUROSURGERY ANSWERING SERVICE CALL  09/06/2023 09:38  Attached To:  Timmie Footman  Source Information  Default, Provider, MD  Document History

## 2023-09-10 ENCOUNTER — Encounter: Payer: Self-pay | Admitting: Family Medicine

## 2023-09-10 ENCOUNTER — Other Ambulatory Visit: Payer: Self-pay | Admitting: Family Medicine

## 2023-09-10 ENCOUNTER — Ambulatory Visit (INDEPENDENT_AMBULATORY_CARE_PROVIDER_SITE_OTHER): Admitting: Family Medicine

## 2023-09-10 ENCOUNTER — Ambulatory Visit: Admitting: Family Medicine

## 2023-09-10 VITALS — BP 113/75 | HR 75 | Ht 64.0 in | Wt 121.0 lb

## 2023-09-10 DIAGNOSIS — M5412 Radiculopathy, cervical region: Secondary | ICD-10-CM | POA: Diagnosis not present

## 2023-09-10 DIAGNOSIS — Z87898 Personal history of other specified conditions: Secondary | ICD-10-CM | POA: Diagnosis not present

## 2023-09-10 DIAGNOSIS — M47819 Spondylosis without myelopathy or radiculopathy, site unspecified: Secondary | ICD-10-CM

## 2023-09-10 DIAGNOSIS — F5105 Insomnia due to other mental disorder: Secondary | ICD-10-CM | POA: Diagnosis not present

## 2023-09-10 DIAGNOSIS — G894 Chronic pain syndrome: Secondary | ICD-10-CM | POA: Diagnosis not present

## 2023-09-10 DIAGNOSIS — G629 Polyneuropathy, unspecified: Secondary | ICD-10-CM

## 2023-09-10 DIAGNOSIS — M5126 Other intervertebral disc displacement, lumbar region: Secondary | ICD-10-CM | POA: Diagnosis not present

## 2023-09-10 DIAGNOSIS — M51362 Other intervertebral disc degeneration, lumbar region with discogenic back pain and lower extremity pain: Secondary | ICD-10-CM

## 2023-09-10 DIAGNOSIS — I10 Essential (primary) hypertension: Secondary | ICD-10-CM | POA: Diagnosis not present

## 2023-09-10 DIAGNOSIS — F409 Phobic anxiety disorder, unspecified: Secondary | ICD-10-CM | POA: Diagnosis not present

## 2023-09-10 DIAGNOSIS — G90511 Complex regional pain syndrome I of right upper limb: Secondary | ICD-10-CM

## 2023-09-10 DIAGNOSIS — F418 Other specified anxiety disorders: Secondary | ICD-10-CM

## 2023-09-10 MED ORDER — TRAZODONE HCL 50 MG PO TABS: 50.0000 mg | ORAL_TABLET | Freq: Every day | ORAL | 1 refills | Status: DC

## 2023-09-10 MED ORDER — HYDROCODONE-ACETAMINOPHEN 5-325 MG PO TABS: 1.0000 | ORAL_TABLET | Freq: Four times a day (QID) | ORAL | 0 refills | Status: DC | PRN

## 2023-09-10 MED ORDER — GABAPENTIN 100 MG PO CAPS
100.0000 mg | ORAL_CAPSULE | Freq: Two times a day (BID) | ORAL | Status: DC
Start: 2023-09-10 — End: 2023-09-16

## 2023-09-10 MED ORDER — BUSPIRONE HCL 5 MG PO TABS: 5.0000 mg | ORAL_TABLET | Freq: Two times a day (BID) | ORAL | 6 refills | Status: DC

## 2023-09-10 NOTE — Assessment & Plan Note (Signed)
 Chronic  Well controlled 113/75 Metoprolol  25mg 

## 2023-09-10 NOTE — Progress Notes (Signed)
 Established patient visit   Patient: Christine Garcia   DOB: [birth year]   63 y.o. Female  MRN: [id number] Visit Date: 09/10/2023  Today's healthcare provider: Mimi Alt, MD   Chief Complaint  Patient presents with   Follow-up    Discuss some medication    Subjective     HPI     Follow-up    Additional comments: Discuss some medication       Last edited by Bart Lieu, CMA on 09/10/2023 10:28 AM.       Discussed the use of AI scribe software for clinical note transcription with the patient, who gave verbal consent to proceed.  History of Present Illness Christine Garcia is a 63 year old female with atrial fibrillation who presents for a one month follow-up after recent surgery.  She feels very good after her recent surgery but is currently unable to scoop a litter box with her right arm. She uses her left hand for vacuuming and is optimistic about regaining full use of her arm over time. She is satisfied with having her arm back, regardless of its appearance. The surgery was performed while her heart was in atrial fibrillation, which was not possible in [place]  due to her heart condition. She notes that the surgery went well despite this.  She experiences anxiety and takes duloxetine  60 mg once daily, after finishing her 30 mg twice daily dose. She also takes trazodone 50 mg at bedtime for sleep. She is concerned that duloxetine  does not fully address her anxiety.  She takes gabapentin  100 mg twice daily for nerve pain and hydrocodone  5/325 mg as needed for pain, which she uses sparingly. She has a history of alcohol use but states that she and her husband no longer drink.  She has a history of seizures but reports no new seizures since her last visit. She is scheduled to see neurology for further evaluation.  She uses triamcinolone cream for her arm post-surgery, initially prescribed for pre-surgery use due to skin irritation. She is cautious  about using it on her scar to avoid skin thinning. She reports dry, flaky skin on her arm and uses Eucerin for moisturizing. She is concerned about maintaining skin health post-surgery.  She mentions having canker sores on her feet and uses patches for treatment. She has been referred to a podiatrist for further management.     Past Medical History:  Diagnosis Date   Anxiety    Atrial fibrillation (HCC)    Complication of anesthesia    states "is hard to numb"   Dupuytren's contracture of right hand 08/13/2014   ring/middle fingers   GERD (gastroesophageal reflux disease)    High triglycerides    History of atrial fibrillation    PAF with RVR 11/2011 & 09/2019   Hypertension    under control with med., has been on med. x 2 yr.   Migraines    occasional   Murmur    trivial AR, mild MR/TR 09/28/19 TTE   Neuropathy    Seasonal allergies    Seizures (HCC)    last seizure in 2022 per patient   Substance abuse (HCC)    pt reports ETOH abuse in past    Medications: Outpatient Medications Prior to Visit  Medication Sig   acetaminophen  (TYLENOL ) 325 MG tablet Take 650 mg by mouth every 6 (six) hours as needed for mild pain (pain score 1-3) or moderate pain (pain score 4-6).   alendronate (FOSAMAX)  70 MG tablet Take 70 mg by mouth once a week. Take with a full glass of water on an empty stomach.   diclofenac  Sodium (VOLTAREN ) 1 % GEL Apply 4 g topically 4 (four) times daily.   DULoxetine  (CYMBALTA ) 60 MG capsule Take 1 capsule (60 mg total) by mouth daily.   ELIQUIS 5 MG TABS tablet Take 5 mg by mouth 2 (two) times daily.   folic acid  (FOLVITE ) 1 MG tablet Take 1 tablet (1 mg total) by mouth daily.   furosemide (LASIX) 20 MG tablet TAKE 1 TABLET BY MOUTH DAILY   hydrOXYzine  (ATARAX ) 25 MG tablet Take 1 tablet (25 mg total) by mouth every 6 (six) hours as needed. (Patient taking differently: Take 50 mg by mouth 2 (two) times daily.)   levETIRAcetam (KEPPRA) 100 MG/ML solution Take 10  mLs by mouth 2 (two) times daily.   metoprolol  succinate (TOPROL -XL) 25 MG 24 hr tablet Take 1 tablet (25 mg total) by mouth daily.   Multiple Vitamins-Minerals (MULTIVITAMIN WITH MINERALS) tablet Take 1 tablet by mouth daily.   naloxone (NARCAN) nasal spray 4 mg/0.1 mL Narcan 4 mg/actuation nasal spray  1 spray in one nostril may repeat dose every 2-3 mins until patient is responsive or EMS arrives.  use as needed for opiate overdose.   omeprazole (PRILOSEC) 20 MG capsule TAKE 1 CAPSULE BY MOUTH DAILY   pravastatin (PRAVACHOL) 20 MG tablet Take 20 mg by mouth daily.   pregabalin (LYRICA) 200 MG capsule Take 200 mg by mouth 2 (two) times daily.   tetrahydrozoline 0.05 % ophthalmic solution Place 1 drop into both eyes daily as needed (Dry eye).   [DISCONTINUED] gabapentin  (NEURONTIN ) 100 MG capsule Take 1 capsule (100 mg total) by mouth at bedtime.   [DISCONTINUED] HYDROcodone -acetaminophen  (NORCO/VICODIN) 5-325 MG tablet Take 1 tablet by mouth every 6 (six) hours as needed for severe pain (pain score 7-10).   No facility-administered medications prior to visit.    Review of Systems  Last CBC Lab Results  Component Value Date   WBC 6.4 08/13/2023   HGB 13.3 08/13/2023   HCT 40.0 08/13/2023   MCV 92 08/13/2023   MCH 30.7 08/13/2023   RDW 11.9 08/13/2023   PLT 321 08/13/2023   Last metabolic panel Lab Results  Component Value Date   GLUCOSE 96 08/13/2023   NA 135 08/13/2023   K 4.6 08/13/2023   CL 91 (L) 08/13/2023   CO2 27 08/13/2023   BUN 12 08/13/2023   CREATININE 0.75 08/13/2023   EGFR 90 08/13/2023   CALCIUM 9.6 08/13/2023   PROT 6.7 08/13/2023   ALBUMIN 4.5 08/13/2023   LABGLOB 2.2 08/13/2023   BILITOT 0.4 08/13/2023   ALKPHOS 81 08/13/2023   AST 20 08/13/2023   ALT 15 08/13/2023   ANIONGAP 9 01/11/2015   Last lipids Lab Results  Component Value Date   CHOL 229 (H) 08/13/2023   HDL 62 08/13/2023   LDLCALC 157 (H) 08/13/2023   LDLDIRECT 85.5 02/01/2014    TRIG 59 08/13/2023   CHOLHDL 3.7 08/13/2023   Last hemoglobin A1c Lab Results  Component Value Date   HGBA1C 5.2 08/13/2023   Last thyroid  functions Lab Results  Component Value Date   TSH 1.250 08/13/2023        Objective    BP 113/75   Pulse 75   Ht 5\' 4"  (1.626 m)   Wt 121 lb (54.9 kg)   LMP 09/15/2011   SpO2 100%   BMI 20.77 kg/m  BP Readings from Last 3 Encounters:  09/10/23 113/75  08/23/23 (!) 108/97  08/13/23 112/68   Wt Readings from Last 3 Encounters:  09/10/23 121 lb (54.9 kg)  08/23/23 114 lb (51.7 kg)  08/13/23 114 lb (51.7 kg)        Physical Exam Vitals reviewed.  Constitutional:      General: She is not in acute distress.    Appearance: Normal appearance. She is not ill-appearing, toxic-appearing or diaphoretic.     Comments: Chronically ill appearing female in NAD seated in exam room   Eyes:     Conjunctiva/sclera: Conjunctivae normal.  Cardiovascular:     Rate and Rhythm: Normal rate and regular rhythm.     Pulses: Normal pulses.     Heart sounds: Normal heart sounds. No murmur heard.    No friction rub. No gallop.  Pulmonary:     Effort: Pulmonary effort is normal. No respiratory distress.     Breath sounds: Normal breath sounds. No stridor. No wheezing, rhonchi or rales.  Abdominal:     General: Bowel sounds are normal. There is no distension.     Palpations: Abdomen is soft.     Tenderness: There is no abdominal tenderness.  Musculoskeletal:       Arms:     Right lower leg: No edema.     Left lower leg: No edema.     Comments: Kyphosis   Skin:    Findings: No erythema or rash.  Neurological:     Mental Status: She is alert and oriented to person, place, and time.  Psychiatric:        Mood and Affect: Mood and affect normal.        Speech: Speech normal.        Behavior: Behavior normal. Behavior is cooperative.       No results found for any visits on 09/10/23.  Assessment & Plan     Problem List Items Addressed  This Visit       Cardiovascular and Mediastinum   Essential hypertension - Primary     Nervous and Auditory   Polyneuropathy   Relevant Medications   traZODone (DESYREL) 50 MG tablet   busPIRone (BUSPAR) 5 MG tablet   gabapentin  (NEURONTIN ) 100 MG capsule   Complex regional pain syndrome of right upper extremity   Relevant Medications   traZODone (DESYREL) 50 MG tablet   busPIRone (BUSPAR) 5 MG tablet   HYDROcodone -acetaminophen  (NORCO/VICODIN) 5-325 MG tablet   gabapentin  (NEURONTIN ) 100 MG capsule   Cervical radiculopathy   Relevant Medications   traZODone (DESYREL) 50 MG tablet   busPIRone (BUSPAR) 5 MG tablet   HYDROcodone -acetaminophen  (NORCO/VICODIN) 5-325 MG tablet   gabapentin  (NEURONTIN ) 100 MG capsule     Musculoskeletal and Integument   Spondylosis without myelopathy   Relevant Medications   HYDROcodone -acetaminophen  (NORCO/VICODIN) 5-325 MG tablet   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   Relevant Medications   HYDROcodone -acetaminophen  (NORCO/VICODIN) 5-325 MG tablet   Degeneration of intervertebral disc of lumbar region   Relevant Medications   HYDROcodone -acetaminophen  (NORCO/VICODIN) 5-325 MG tablet     Other   Insomnia due to anxiety and fear   Depression with anxiety   Relevant Medications   traZODone (DESYREL) 50 MG tablet   busPIRone (BUSPAR) 5 MG tablet   Chronic pain   Relevant Medications   traZODone (DESYREL) 50 MG tablet   HYDROcodone -acetaminophen  (NORCO/VICODIN) 5-325 MG tablet   gabapentin  (NEURONTIN ) 100 MG capsule   Other Visit Diagnoses  History of alcohol use disorder       Relevant Medications   gabapentin  (NEURONTIN ) 100 MG capsule        Assessment & Plan Atrial Fibrillation Chronic  Atrial fibrillation with well-controlled heart rate during surgery, anesthesia tolerated without complications. - Follow-up with cardiology on July 3rd. -continue Eliquis 5mg  twice per day & continue metoprolol   25mg  daily   Seizure disorder Chronic  No seizures reported since last visit. Follow-up appointments with neurology and neurosurgery are scheduled. - Follow-up with neurosurgery on May 5th. - Follow-up with neurology on May 27th.  Chronic pain Chronic pain managed with hydrocodone  5/325 mg as needed and gabapentin  100 mg twice daily for nerve pain. - Prescribe hydrocodone  5/325 mg every six hours as needed, dispense 30 tablets. - Continue gabapentin  100 mg twice daily.  Anxiety Anxiety not well-controlled with current duloxetine  regimen. Discussed potential use of Xanax, but concerns about long-term effects such as dementia and dizziness. Buspirone considered as a maintenance option. - Start buspirone 5 mg twice daily. - Continue duloxetine  60 mg once daily after finishing current 30 mg twice daily supply. - Continue trazodone 50 mg at bedtime for sleep.     Return in about 4 months (around 01/10/2024) for CHRONIC F/U.         Mimi Alt, MD  [facility] [phone] (phone) [phone] (fax)  [facility]

## 2023-09-11 ENCOUNTER — Other Ambulatory Visit: Payer: Self-pay

## 2023-09-11 ENCOUNTER — Other Ambulatory Visit: Payer: Self-pay | Admitting: Family Medicine

## 2023-09-11 MED ORDER — MULTI-VITAMIN/MINERALS PO TABS
1.0000 | ORAL_TABLET | Freq: Every day | ORAL | 0 refills | Status: DC
  Filled 2023-09-11: qty 130, 130d supply, fill #0

## 2023-09-11 MED ORDER — PRAVASTATIN SODIUM 20 MG PO TABS: 20.0000 mg | ORAL_TABLET | Freq: Every day | ORAL | 3 refills | Status: DC

## 2023-09-11 NOTE — Telephone Encounter (Signed)
 [facility] is requesting refill pravastatin (PRAVACHOL) 20 MG tablet   Please advise

## 2023-09-11 NOTE — Telephone Encounter (Signed)
 LOV 4*59*25 NOV 8*28*25 LRF 1*8*25 LABS 4*1*25

## 2023-09-12 ENCOUNTER — Telehealth: Payer: Self-pay

## 2023-09-12 NOTE — Telephone Encounter (Signed)
 Copied from CRM [id number]. Topic: Clinical - Prescription Issue >> Sep 12, 2023 11:50 AM Stanly Early wrote: Reason for CRM: MD simmons prescribe RX Multiple Vitamins-Minerals (MULTIVITAMIN WITH MINERALS) tablet. Patient stated the pill are too big for her to sallow and would like to take something that's smaller or in a capsule form.

## 2023-09-12 NOTE — Telephone Encounter (Signed)
 Requested medication (s) are due for refill today: Yes  Requested medication (s) are on the active medication list: Yes  Last refill:  07/23/23  Future visit scheduled: No  Notes to clinic:  Valproic acid not on medication current med list      Requested Prescriptions  Pending Prescriptions Disp Refills   hydrOXYzine  (ATARAX ) 50 MG tablet [Pharmacy Med Name: HYDROXYZINE  HCL 50 MG TABLET] 60 tablet     Sig: TAKE 2 TABLETS BY MOUTH TWICE A DAY AS NEEDED     Ear, Nose, and Throat:  Antihistamines 2 Passed - 09/12/2023  4:13 PM      Passed - Cr in normal range and within 360 days    Creatinine, Ser  Date Value Ref Range Status  08/13/2023 0.75 0.57 - 1.00 mg/dL Final         Passed - Valid encounter within last 12 months    Recent Outpatient Visits           2 days ago Essential hypertension   [facility] Simmons-Robinson, [place], MD   1 month ago Essential hypertension   [facility] [place], [place], MD       Future Appointments             In 2 months End, Veryl Gottron, MD [facility]             valproic acid (DEPAKENE) 250 MG capsule [Pharmacy Med Name: VALPROIC ACID 250 MG CAPSULE] 120 capsule     Sig: TAKE 2 CAPSULES BY MOUTH EVERY 12 HOURS     Neurology:  Anticonvulsants - Valproates Failed - 09/12/2023  4:13 PM      Failed - Valproic Acid (serum) in normal range and within 360 days    No results found for: "VALPROATE", "VPAT"       Passed - AST in normal range and within 360 days    AST  Date Value Ref Range Status  08/13/2023 20 0 - 40 IU/L Final         Passed - ALT in normal range and within 360 days    ALT  Date Value Ref Range Status  08/13/2023 15 0 - 32 IU/L Final         Passed - HGB in normal range and within 360 days    Hemoglobin  Date Value Ref Range Status  08/13/2023 13.3 11.1 - 15.9 g/dL Final         Passed - PLT in normal range and within 360  days    Platelets  Date Value Ref Range Status  08/13/2023 321 150 - 450 x10E3/uL Final         Passed - WBC in normal range and within 360 days    WBC  Date Value Ref Range Status  08/13/2023 6.4 3.4 - 10.8 x10E3/uL Final  11/11/2015 11.0 (H) 4.0 - 10.5 K/uL Final         Passed - HCT in normal range and within 360 days    Hematocrit  Date Value Ref Range Status  08/13/2023 40.0 34.0 - 46.6 % Final         Passed - Completed PHQ-2 or PHQ-9 in the last 360 days      Passed - Patient is not pregnant      Passed - Valid encounter within last 12 months    Recent Outpatient Visits           2 days  ago Essential hypertension   [facility] Simmons-Robinson, [place], MD   1 month ago Essential hypertension    [facility] [place], Judyann Number, MD       Future Appointments             In 2 months End, Veryl Gottron, MD [facility]

## 2023-09-13 ENCOUNTER — Other Ambulatory Visit: Payer: Self-pay | Admitting: Family Medicine

## 2023-09-13 NOTE — Telephone Encounter (Signed)
 Called and was able inform the pt of the message form Dr R, she verbally stated she understood and had no other questions

## 2023-09-13 NOTE — Telephone Encounter (Signed)
 Please advise patient that she can purchase over the counter mini vitamins from the [employer] brand for women 72+ years old to help with ease of swallowing one a day petites

## 2023-09-16 ENCOUNTER — Ambulatory Visit: Admitting: Physician Assistant

## 2023-09-16 ENCOUNTER — Encounter: Payer: Self-pay | Admitting: Physician Assistant

## 2023-09-16 DIAGNOSIS — S42209D Unspecified fracture of upper end of unspecified humerus, subsequent encounter for fracture with routine healing: Secondary | ICD-10-CM

## 2023-09-16 DIAGNOSIS — G629 Polyneuropathy, unspecified: Secondary | ICD-10-CM

## 2023-09-16 DIAGNOSIS — M40209 Unspecified kyphosis, site unspecified: Secondary | ICD-10-CM

## 2023-09-16 DIAGNOSIS — Z87898 Personal history of other specified conditions: Secondary | ICD-10-CM

## 2023-09-16 MED ORDER — GABAPENTIN 100 MG PO CAPS: 300.0000 mg | ORAL_CAPSULE | Freq: Three times a day (TID) | ORAL | Status: DC

## 2023-09-16 NOTE — Progress Notes (Unsigned)
 Referring Physician:  No referring provider defined for this encounter.  Primary Physician:  Mimi Alt, MD  History of Present Illness: 08/13/23 Christine Garcia is here today with a chief complaint of arm pain, intermittent numbness and tingling in her hands as well as low back pain.  Patient states that she had a right arm injury from a fall back in July 2024, however it has not healed.  She adds that they decided treated nonoperatively due to her A-fib, but feels as though her arm is "floating" she states she is unable to do much with her right upper extremity and is concerned.  She does states she has intermittent pain in the back of her neck however it is not consistent or severe.  The intermittent numbness and tingling in her hands is in bilateral upper extremities, but worse in the right compared to the left.  She has contractures in bilateral hands that make it difficult at baseline.   In addition, patient states that she has a history of multiple spine fractures and degenerative disc disease in which she has previously undergone injections for.  She is concerned because she feels as though her ambulation has been affected and she been tripping on her feet and always feels as though there is something on the bottom of her right foot.  She uses a cane for ambulation and she denies any pain that shoots down bilateral lower extremities consistently however feels as though sometimes her "feet stop".   Neuropathy in feet, bottom and top of feet a year.  Continues middle of back down. Stays in back, doesn't shoot down.   Conservative measures:  Physical therapy:  has not participated in PT Multimodal medical therapy including regular antiinflammatories:  Tramadol , Tizanidine, Hydrocodone , Cymbalta  Injections: 12/13/2017, 11/11/2019 epidural steroid injections Lumbar spine   The symptoms are causing a significant impact on the patient's life.   Review of Systems:  A 10  point review of systems is negative, except for the pertinent positives and negatives detailed in the HPI.  Past Medical History: Past Medical History:  Diagnosis Date   Anxiety    Atrial fibrillation (HCC)    Complication of anesthesia    states "is hard to numb"   Dupuytren's contracture of right hand 08/13/2014   ring/middle fingers   GERD (gastroesophageal reflux disease)    High triglycerides    History of atrial fibrillation    PAF with RVR 11/2011 & 09/2019   Hypertension    under control with med., has been on med. x 2 yr.   Migraines    occasional   Murmur    trivial AR, mild MR/TR 09/28/19 TTE   Neuropathy    Seasonal allergies    Seizures (HCC)    last seizure in 2022 per patient   Substance abuse (HCC)    pt reports ETOH abuse in past    Past Surgical History: Past Surgical History:  Procedure Laterality Date   CARDIAC CATHETERIZATION  07/14/2010   "nonobstructive CAD"   DILATION AND CURETTAGE OF UTERUS  2002   FASCIECTOMY Right 09/02/2014   Procedure: RIGHT HAND FASCIECTOMY RIGHT RING AND RIGHT MIDDLE FINGER ;  Surgeon: Lyanne Sample, MD;  Location: [facility];  Service: Orthopedics;  Laterality: Right;   ORIF HUMERUS FRACTURE Left 01/13/2015   Procedure: OPEN REDUCTION INTERNAL FIXATION (ORIF) LEFT PROXIMAL HUMERUS FRACTURE;  Surgeon: Ellard Gunning, MD;  Location: [facility] OR;  Service: Orthopedics;  Laterality: Left;   ORIF HUMERUS FRACTURE  Right 08/23/2023   Procedure: OPEN REDUCTION INTERNAL FIXATION (ORIF) HUMERAL SHAFT FRACTURE;  Surgeon: Laneta Pintos, MD;  Location: [facility] OR;  Service: Orthopedics;  Laterality: Right;    Allergies: Allergies as of 09/16/2023 - Review Complete 09/16/2023  Allergen Reaction Noted   Penicillins Hives 07/10/2010    Medications: Outpatient Encounter Medications as of 09/16/2023  Medication Sig   acetaminophen  (TYLENOL ) 325 MG tablet Take 650 mg by mouth every 6 (six) hours as needed for mild pain (pain score 1-3) or  moderate pain (pain score 4-6).   alendronate (FOSAMAX) 70 MG tablet Take 70 mg by mouth once a week. Take with a full glass of water on an empty stomach.   busPIRone  (BUSPAR ) 5 MG tablet Take 1 tablet (5 mg total) by mouth 2 (two) times daily.   diclofenac  Sodium (VOLTAREN ) 1 % GEL Apply 4 g topically 4 (four) times daily.   DULoxetine  (CYMBALTA ) 60 MG capsule Take 1 capsule (60 mg total) by mouth daily.   ELIQUIS 5 MG TABS tablet Take 5 mg by mouth 2 (two) times daily.   folic acid  (FOLVITE ) 1 MG tablet Take 1 tablet (1 mg total) by mouth daily.   furosemide (LASIX) 20 MG tablet TAKE 1 TABLET BY MOUTH DAILY   gabapentin  (NEURONTIN ) 100 MG capsule Take 1 capsule (100 mg total) by mouth 2 (two) times daily.   HYDROcodone -acetaminophen  (NORCO/VICODIN) 5-325 MG tablet Take 1 tablet by mouth every 6 (six) hours as needed for severe pain (pain score 7-10).   hydrOXYzine  (ATARAX ) 25 MG tablet Take 1 tablet (25 mg total) by mouth every 6 (six) hours as needed. (Patient taking differently: Take 50 mg by mouth 2 (two) times daily.)   hydrOXYzine  (ATARAX ) 50 MG tablet TAKE 2 TABLETS BY MOUTH TWICE A DAY AS NEEDED   levETIRAcetam (KEPPRA) 100 MG/ML solution Take 10 mLs by mouth 2 (two) times daily.   metoprolol  succinate (TOPROL -XL) 25 MG 24 hr tablet Take 1 tablet (25 mg total) by mouth daily.   Multiple Vitamins-Minerals (MULTIVITAMIN WITH MINERALS) tablet Take 1 tablet by mouth daily.   naloxone (NARCAN) nasal spray 4 mg/0.1 mL Narcan 4 mg/actuation nasal spray  1 spray in one nostril may repeat dose every 2-3 mins until patient is responsive or EMS arrives.  use as needed for opiate overdose.   omeprazole (PRILOSEC) 20 MG capsule TAKE 1 CAPSULE BY MOUTH DAILY   pravastatin  (PRAVACHOL ) 20 MG tablet Take 1 tablet (20 mg total) by mouth daily.   pregabalin (LYRICA) 200 MG capsule Take 200 mg by mouth 2 (two) times daily.   tetrahydrozoline 0.05 % ophthalmic solution Place 1 drop into both eyes daily as  needed (Dry eye).   traZODone  (DESYREL ) 50 MG tablet Take 1 tablet (50 mg total) by mouth at bedtime.   valproic acid (DEPAKENE) 250 MG capsule TAKE 2 CAPSULES BY MOUTH EVERY 12 HOURS   No facility-administered encounter medications on file as of 09/16/2023.    Social History: Social History   Tobacco Use   Smoking status: Never   Smokeless tobacco: Never  Vaping Use   Vaping status: Never Used  Substance Use Topics   Alcohol use: Not Currently    Comment: hx ETOH use- has not drank in 68 days as of 08/21/23   Drug use: Yes    Frequency: 21.0 times per week    Types: Marijuana    Comment: medicated marijuana- TID    Family Medical History: Family History  Problem Relation Age  of Onset   Congestive Heart Failure Father    COPD Father    CVA Father    Stroke Mother    Heart attack Mother    Heart attack Sister 40    Physical Examination: @VITALWITHPAIN @  General: Patient is well developed, well nourished, calm, collected, and in no apparent distress. Attention to examination is appropriate.  Psychiatric: Patient is non-anxious.  Head:  Pupils equal, round, and reactive to light.  ENT:  Oral mucosa appears well hydrated.  Neck:   Supple.  Full range of motion.  Respiratory: Patient is breathing without any difficulty.  Extremities: No edema.  Vascular: Palpable dorsal pedal pulses.  Skin:   On exposed skin, there are no abnormal skin lesions.  NEUROLOGICAL:     Awake, alert, oriented to person, place, and time.  Speech is clear and fluent. Fund of knowledge is appropriate.   Cranial Nerves: Pupils equal round and reactive to light.  Facial tone is symmetric.  Facial sensation is symmetric.   Some tenderness to palpation of cervical and thoracic spine  Strength:  ** contractures in  bilateral hands   Obviously displaced right humerus fracture.  This is mobile and severely unstable.  Right upper extremity exam deferred.  Left upper extremity patient is able  to actively range.   Side Iliopsoas Quads Hamstring PF DF EHL  R 5 5 5 5 5 5   L 5 5 5 5 5 5       Hoffman's is absent, but difficult due to contractures. Clonus is not present.  Toes are down-going.  Bilateral upper and lower extremity sensation is intact to light touch.    Abnormal gait.  Medical Decision Making  Imaging:  CT RUE 12/2022 1. A healing fracture through the middle third of the right humeral shaft with external callus formation but without significant internal point bridging. There is nonspecific periosteal reaction, likely related to fracture healing.  2. Nonspecific periosteal reaction at the fracture site, likely related to fracture healing.   I have personally reviewed the images and agree with the above interpretation.  Assessment and Plan: Ms. Brandsma is a pleasant 63 y.o. female with a past medical history of anxiety, Dupuytren's contractures, A-fib, hypertension, and CRPS diagnosis.  She comes today with predominantly right arm pain as well as neck and back pain.  Right displaced humerus fracture -Updated x-rays today -Orthopedic reach out to and recommended surgeons in [place].   Neuropathy/back pain -Labs already ordered per primary workup -Referral to neurology made, formal consult and EMG requested. -Lumbar x-rays ordered today  Pleasure to see patient in clinic today.  Of utmost importance is grossly unstable right humerus fracture.  Patient does have history of multiple compression fractures in her spine and I am concerned due to lack of healing.  Endocrinology consult placed as well for evaluation.  Thank you for involving me in the care of this patient.   I spent a total of 60 minutes in both face-to-face and non-face-to-face activities for this visit on the date of this encounter including preparing to see the patient, reviewing previous test, obtaining and reviewing history and examination, counseling the patient and her spouse, ordering  additional tests, discussing her case with on-call orthopedic specialist, independently interpreting results, and care coordination.  Ludwig Safer, PA-C Dept. of Neurosurgery

## 2023-09-17 ENCOUNTER — Other Ambulatory Visit: Payer: Self-pay | Admitting: Family Medicine

## 2023-09-17 ENCOUNTER — Ambulatory Visit: Payer: Self-pay | Admitting: Family Medicine

## 2023-09-17 MED ORDER — BUSPIRONE HCL 5 MG PO TABS: 5.0000 mg | ORAL_TABLET | Freq: Two times a day (BID) | ORAL | 6 refills | Status: DC

## 2023-09-17 MED ORDER — ELIQUIS 5 MG PO TABS: 5.0000 mg | ORAL_TABLET | Freq: Two times a day (BID) | ORAL | 0 refills | Status: DC

## 2023-09-17 NOTE — Telephone Encounter (Signed)
  Chief Complaint: medication refill  Additional Notes: Patient states she has not run out her Eliquis, she has about a week left. She states Dr Branson Calandra has become her PCP recently and she needs the medication refilled.  Reason for Disposition  [1] Caller requesting a prescription renewal (no refills left), no triage required, AND [2] triager able to renew prescription per department policy  Answer Assessment - Initial Assessment Questions 1. DRUG NAME: "What medicine do you need to have refilled?"     Buspar  and Eliquis.  2. REFILLS REMAINING: "How many refills are remaining?" (Note: The label on the medicine or pill bottle will show how many refills are remaining. If there are no refills remaining, then a renewal may be needed.)     0 3. EXPIRATION DATE: "What is the expiration date?" (Note: The label states when the prescription will expire, and thus can no longer be refilled.)     N/A. 4. PRESCRIBING HCP: "Who prescribed it?" Reason: If prescribed by specialist, call should be referred to that group.     Patient states it was her PCP who prescribed the medications but Dr Branson Calandra is now her PCP.  5. SYMPTOMS: "Do you have any symptoms?"     None.  6. PREGNANCY: "Is there any chance that you are pregnant?" "When was your last menstrual period?"     N/A.  Protocols used: Medication Refill and Renewal Call-A-AH

## 2023-09-17 NOTE — Telephone Encounter (Unsigned)
 Copied from CRM [id number]. Topic: Clinical - Medication Refill >> Sep 17, 2023  3:34 PM Antwanette L wrote: Most Recent Primary Care Visit:  Provider: Mimi Alt  Department: [facility]  Visit Type: OFFICE VISIT  Date: 09/10/2023  Medication: ELIQUIS 5 MG TABS tablet  Has the patient contacted their pharmacy? No  Is this the correct pharmacy for this prescription? Yes  [id number] [place], [place] - [address] [place] [address] Phone: [phone] Fax: [phone]   Has the prescription been filled recently? No.  Is the patient out of the medication? No  Has the patient been seen for an appointment in the last year OR does the patient have an upcoming appointment? Yes. Last ov with Dr. Saralee Cummins- Verdia Glad was on 09/10/23  Can we respond through MyChart? No. Contact patient by phone at [phone]  Agent: Please be advised that Rx refills may take up to 3 business days. We ask that you follow-up with your pharmacy.

## 2023-09-17 NOTE — Telephone Encounter (Signed)
 Copied from CRM [id number]. Topic: Clinical - Medication Refill >> Sep 17, 2023  3:34 PM Antwanette L wrote: Most Recent Primary Care Visit:  Provider: Mimi Alt  Department: [facility]  Visit Type: OFFICE VISIT  Date: 09/10/2023  Medication: ELIQUIS 5 MG TABS tablet  Has the patient contacted their pharmacy? No  Is this the correct pharmacy for this prescription? Yes  [id number] [place], [place] - [address] [place] [address] Phone: [phone] Fax: [phone]   Has the prescription been filled recently? No.  Is the patient out of the medication? No  Has the patient been seen for an appointment in the last year OR does the patient have an upcoming appointment? Yes. Last ov with Dr. Saralee Cummins- Verdia Glad was on 09/10/23  Can we respond through MyChart? No. Contact patient by phone at [phone]  Agent: Please be advised that Rx refills may take up to 3 business days. We ask that you follow-up with your pharmacy. >> Sep 17, 2023  3:42 PM Antwanette L wrote: Please disregard this request. I sent a message to the NT.

## 2023-09-18 ENCOUNTER — Other Ambulatory Visit: Payer: Self-pay | Admitting: Physician Assistant

## 2023-09-18 ENCOUNTER — Other Ambulatory Visit: Payer: Self-pay | Admitting: Family Medicine

## 2023-09-18 DIAGNOSIS — G629 Polyneuropathy, unspecified: Secondary | ICD-10-CM

## 2023-09-18 DIAGNOSIS — Z87898 Personal history of other specified conditions: Secondary | ICD-10-CM

## 2023-09-18 MED ORDER — GABAPENTIN 100 MG PO CAPS: 300.0000 mg | ORAL_CAPSULE | Freq: Three times a day (TID) | ORAL | Status: DC

## 2023-09-18 NOTE — Progress Notes (Signed)
 Re-sent gabapentin  due to technical error

## 2023-09-18 NOTE — Telephone Encounter (Signed)
 [facility]  (NEURONTIN ) 300 MG capsule   New Dosage for this RX [phone] Please advise

## 2023-09-19 ENCOUNTER — Other Ambulatory Visit: Payer: Self-pay | Admitting: Physician Assistant

## 2023-09-19 ENCOUNTER — Ambulatory Visit
Admission: RE | Admit: 2023-09-19 | Discharge: 2023-09-19 | Disposition: A | Discharge status: Home / Self Care | Payer: Self-pay | Source: Ambulatory Visit | Attending: Family Medicine | Admitting: Family Medicine

## 2023-09-19 ENCOUNTER — Telehealth: Payer: Self-pay | Admitting: Physician Assistant

## 2023-09-19 DIAGNOSIS — E0789 Other specified disorders of thyroid: Secondary | ICD-10-CM | POA: Insufficient documentation

## 2023-09-19 DIAGNOSIS — G629 Polyneuropathy, unspecified: Secondary | ICD-10-CM

## 2023-09-19 DIAGNOSIS — E042 Nontoxic multinodular goiter: Secondary | ICD-10-CM | POA: Insufficient documentation

## 2023-09-19 DIAGNOSIS — Z87898 Personal history of other specified conditions: Secondary | ICD-10-CM

## 2023-09-19 MED ORDER — GABAPENTIN 100 MG PO CAPS: 300.0000 mg | ORAL_CAPSULE | Freq: Three times a day (TID) | ORAL | 3 refills | Status: DC

## 2023-09-19 NOTE — Telephone Encounter (Signed)
 Gabapentin  300mg  3 times a day.  [facility] is telling the patient they have not received the RX and our office has not responded to their request. It looks like Ruthann Cover might has sent it in yesterday.

## 2023-09-19 NOTE — Telephone Encounter (Signed)
 Its not showing a quantity amount, can you try to resend this?

## 2023-09-19 NOTE — Telephone Encounter (Signed)
 Patient notified of refill.

## 2023-09-20 ENCOUNTER — Encounter: Payer: Self-pay | Admitting: Family Medicine

## 2023-09-20 ENCOUNTER — Other Ambulatory Visit: Payer: Self-pay | Admitting: Family Medicine

## 2023-09-20 DIAGNOSIS — F419 Anxiety disorder, unspecified: Secondary | ICD-10-CM

## 2023-09-20 DIAGNOSIS — F5105 Insomnia due to other mental disorder: Secondary | ICD-10-CM

## 2023-09-21 ENCOUNTER — Ambulatory Visit
Admission: RE | Admit: 2023-09-21 | Discharge: 2023-09-21 | Disposition: A | Discharge status: Home / Self Care | Source: Ambulatory Visit | Attending: Physician Assistant | Admitting: Physician Assistant

## 2023-09-21 ENCOUNTER — Ambulatory Visit

## 2023-09-21 DIAGNOSIS — S32029G Unspecified fracture of second lumbar vertebra, subsequent encounter for fracture with delayed healing: Secondary | ICD-10-CM | POA: Insufficient documentation

## 2023-09-21 DIAGNOSIS — M5416 Radiculopathy, lumbar region: Secondary | ICD-10-CM | POA: Insufficient documentation

## 2023-09-21 DIAGNOSIS — S32049D Unspecified fracture of fourth lumbar vertebra, subsequent encounter for fracture with routine healing: Secondary | ICD-10-CM | POA: Diagnosis not present

## 2023-09-21 DIAGNOSIS — S32019G Unspecified fracture of first lumbar vertebra, subsequent encounter for fracture with delayed healing: Secondary | ICD-10-CM | POA: Insufficient documentation

## 2023-09-21 DIAGNOSIS — M48061 Spinal stenosis, lumbar region without neurogenic claudication: Secondary | ICD-10-CM | POA: Diagnosis not present

## 2023-09-21 DIAGNOSIS — S22009B Unspecified fracture of unspecified thoracic vertebra, initial encounter for open fracture: Secondary | ICD-10-CM

## 2023-09-21 DIAGNOSIS — M40204 Unspecified kyphosis, thoracic region: Secondary | ICD-10-CM | POA: Diagnosis not present

## 2023-09-21 DIAGNOSIS — M4856XA Collapsed vertebra, not elsewhere classified, lumbar region, initial encounter for fracture: Secondary | ICD-10-CM | POA: Diagnosis not present

## 2023-09-21 DIAGNOSIS — M4804 Spinal stenosis, thoracic region: Secondary | ICD-10-CM | POA: Diagnosis not present

## 2023-09-21 MED ORDER — GADOBUTROL 1 MMOL/ML IV SOLN
5.0000 mL | Freq: Once | INTRAVENOUS | Status: AC | PRN
  Administered 2023-09-21: 5 mL via INTRAVENOUS

## 2023-09-23 ENCOUNTER — Other Ambulatory Visit: Payer: Self-pay | Admitting: Family Medicine

## 2023-09-25 ENCOUNTER — Ambulatory Visit: Payer: Self-pay | Admitting: Cardiology

## 2023-09-25 LAB — FUNGUS CULTURE WITH STAIN

## 2023-09-25 LAB — FUNGAL ORGANISM REFLEX

## 2023-09-25 LAB — FUNGUS CULTURE RESULT

## 2023-09-25 NOTE — Telephone Encounter (Signed)
 Too soon for refill, refilled 09/13/23.  Requested Prescriptions  Pending Prescriptions Disp Refills   hydrOXYzine  (ATARAX ) 50 MG tablet [Pharmacy Med Name: HYDROXYZINE  HCL 50 MG TABLET] 360 tablet 1    Sig: TAKE 2 TABLETS BY MOUTH TWICE A DAY AS NEEDED     Ear, Nose, and Throat:  Antihistamines 2 Passed - 09/25/2023  8:39 AM      Passed - Cr in normal range and within 360 days    Creatinine, Ser  Date Value Ref Range Status  08/13/2023 0.75 0.57 - 1.00 mg/dL Final         Passed - Valid encounter within last 12 months    Recent Outpatient Visits           2 weeks ago Essential hypertension   [place] [facility] Simmons-Robinson, [place], MD   1 month ago Essential hypertension   [place] [facility] Simmons-Robinson, Judyann Number, MD       Future Appointments             In 1 month End, Veryl Gottron, MD [facility]

## 2023-09-27 ENCOUNTER — Ambulatory Visit: Admitting: Physician Assistant

## 2023-09-27 DIAGNOSIS — G629 Polyneuropathy, unspecified: Secondary | ICD-10-CM | POA: Diagnosis not present

## 2023-09-27 DIAGNOSIS — Z87898 Personal history of other specified conditions: Secondary | ICD-10-CM | POA: Diagnosis not present

## 2023-09-27 NOTE — Progress Notes (Signed)
 Discussed with patient her gabapentin .  She does feel as though it has helped with her pain relief.  She is currently using 300 mg 3 times a day.  After discussing with her would feel its appropriate to bump this to 400 mg 3 times a day.  She does have enough of the medication at this time.  Will not send a refill at this moment.  Questions were answered and concerns addressed.  We are awaiting final reads of her MRI of her spine.    This visit was performed via telephone.  Patient location: home Provider location: office  I spent a total of 5 minutes non-face-to-face activities for this visit on the date of this encounter including review of current clinical condition and response to treatment.  The patient is aware of and accepts the limits of this telehealth visit.

## 2023-10-01 ENCOUNTER — Other Ambulatory Visit: Payer: Self-pay | Admitting: Family Medicine

## 2023-10-01 DIAGNOSIS — S42301K Unspecified fracture of shaft of humerus, right arm, subsequent encounter for fracture with nonunion: Secondary | ICD-10-CM | POA: Diagnosis not present

## 2023-10-02 ENCOUNTER — Telehealth: Payer: Self-pay | Admitting: Family Medicine

## 2023-10-02 ENCOUNTER — Other Ambulatory Visit: Payer: Self-pay | Admitting: Family Medicine

## 2023-10-02 DIAGNOSIS — F419 Anxiety disorder, unspecified: Secondary | ICD-10-CM

## 2023-10-02 DIAGNOSIS — G629 Polyneuropathy, unspecified: Secondary | ICD-10-CM

## 2023-10-02 NOTE — Telephone Encounter (Signed)
 [facility] ELIQUIS  5 MG TABS tablet  Please advise

## 2023-10-08 ENCOUNTER — Encounter: Payer: Self-pay | Admitting: Family Medicine

## 2023-10-11 DIAGNOSIS — M810 Age-related osteoporosis without current pathological fracture: Secondary | ICD-10-CM | POA: Diagnosis not present

## 2023-10-13 ENCOUNTER — Other Ambulatory Visit: Payer: Self-pay | Admitting: Family Medicine

## 2023-10-13 DIAGNOSIS — F5105 Insomnia due to other mental disorder: Secondary | ICD-10-CM

## 2023-10-13 DIAGNOSIS — F409 Phobic anxiety disorder, unspecified: Secondary | ICD-10-CM

## 2023-10-13 DIAGNOSIS — F419 Anxiety disorder, unspecified: Secondary | ICD-10-CM

## 2023-10-14 ENCOUNTER — Other Ambulatory Visit: Payer: Self-pay | Admitting: Family Medicine

## 2023-10-15 ENCOUNTER — Other Ambulatory Visit: Payer: Self-pay | Admitting: Family Medicine

## 2023-10-15 DIAGNOSIS — I4891 Unspecified atrial fibrillation: Secondary | ICD-10-CM

## 2023-10-15 MED ORDER — ELIQUIS 5 MG PO TABS
5.0000 mg | ORAL_TABLET | Freq: Two times a day (BID) | ORAL | 1 refills | Status: DC
Start: 2023-10-15 — End: 2024-04-03

## 2023-10-21 ENCOUNTER — Ambulatory Visit: Payer: Self-pay | Admitting: Physician Assistant

## 2023-10-22 ENCOUNTER — Other Ambulatory Visit: Payer: Self-pay | Admitting: Physician Assistant

## 2023-10-22 DIAGNOSIS — Z87898 Personal history of other specified conditions: Secondary | ICD-10-CM

## 2023-10-22 DIAGNOSIS — G629 Polyneuropathy, unspecified: Secondary | ICD-10-CM

## 2023-10-22 MED ORDER — GABAPENTIN 100 MG PO CAPS
500.0000 mg | ORAL_CAPSULE | Freq: Three times a day (TID) | ORAL | 3 refills | Status: DC
Start: 2023-10-22 — End: 2023-10-22

## 2023-10-23 ENCOUNTER — Other Ambulatory Visit: Payer: Self-pay | Admitting: Physician Assistant

## 2023-10-23 MED ORDER — GABAPENTIN 300 MG PO CAPS: 600.0000 mg | ORAL_CAPSULE | Freq: Three times a day (TID) | ORAL | 2 refills | Status: DC

## 2023-10-24 ENCOUNTER — Other Ambulatory Visit: Payer: Self-pay | Admitting: Family Medicine

## 2023-10-24 DIAGNOSIS — M810 Age-related osteoporosis without current pathological fracture: Secondary | ICD-10-CM | POA: Diagnosis not present

## 2023-10-24 LAB — HM DEXA SCAN

## 2023-10-25 ENCOUNTER — Other Ambulatory Visit: Payer: Self-pay | Admitting: Family Medicine

## 2023-10-25 DIAGNOSIS — K219 Gastro-esophageal reflux disease without esophagitis: Secondary | ICD-10-CM

## 2023-10-25 MED ORDER — LANSOPRAZOLE 30 MG PO CPDR
30.0000 mg | DELAYED_RELEASE_CAPSULE | Freq: Two times a day (BID) | ORAL | 5 refills | Status: AC
Start: 2023-10-25 — End: ?

## 2023-10-25 NOTE — Telephone Encounter (Signed)
 Prevacid  prescribed to replace omeprazole as patient reports symptoms were not well controlled

## 2023-10-29 ENCOUNTER — Other Ambulatory Visit: Payer: Self-pay | Admitting: Family Medicine

## 2023-10-29 DIAGNOSIS — S42301K Unspecified fracture of shaft of humerus, right arm, subsequent encounter for fracture with nonunion: Secondary | ICD-10-CM | POA: Diagnosis not present

## 2023-10-29 MED ORDER — FOLIC ACID 0.8 MG PO CAPS: 0.8000 mg | ORAL_CAPSULE | Freq: Every day | ORAL | 3 refills | Status: DC

## 2023-10-29 NOTE — Progress Notes (Signed)
 Replaced folvite  (no longer covered by insurance) with folic acid  0.8mg  daily

## 2023-10-30 NOTE — Telephone Encounter (Signed)
 Requested medication (s) are due for refill today: see below  Requested medication (s) are on the active medication list: yes  Last refill:  10/29/23  Future visit scheduled: yes  Notes to clinic:  Pharmacy comment: Alternative Requested:NOT COVERED.      Requested Prescriptions  Pending Prescriptions Disp Refills   folic acid  (FOLVITE ) 1 MG tablet [Pharmacy Med Name: FOLIC ACID  1 MG TABLET]  0    Sig: TAKE 1 TABLET BY MOUTH EVERY DAY     Endocrinology:  Vitamins Passed - 10/30/2023 12:31 PM      Passed - Valid encounter within last 12 months    Recent Outpatient Visits           1 month ago Essential hypertension   [place] [facility] Simmons-Robinson, [place], MD   3 months ago Essential hypertension   [place] [facility] Simmons-Robinson, Judyann Number, MD       Future Appointments             In 2 weeks End, Veryl Gottron, MD [facility]

## 2023-11-04 DIAGNOSIS — G5793 Unspecified mononeuropathy of bilateral lower limbs: Secondary | ICD-10-CM | POA: Diagnosis not present

## 2023-11-04 DIAGNOSIS — R208 Other disturbances of skin sensation: Secondary | ICD-10-CM | POA: Diagnosis not present

## 2023-11-04 DIAGNOSIS — Z1331 Encounter for screening for depression: Secondary | ICD-10-CM | POA: Diagnosis not present

## 2023-11-04 DIAGNOSIS — R569 Unspecified convulsions: Secondary | ICD-10-CM | POA: Diagnosis not present

## 2023-11-04 DIAGNOSIS — Z748 Other problems related to care provider dependency: Secondary | ICD-10-CM | POA: Diagnosis not present

## 2023-11-04 DIAGNOSIS — M720 Palmar fascial fibromatosis [Dupuytren]: Secondary | ICD-10-CM | POA: Diagnosis not present

## 2023-11-04 DIAGNOSIS — R202 Paresthesia of skin: Secondary | ICD-10-CM | POA: Diagnosis not present

## 2023-11-04 DIAGNOSIS — G629 Polyneuropathy, unspecified: Secondary | ICD-10-CM | POA: Diagnosis not present

## 2023-11-04 DIAGNOSIS — Z599 Problem related to housing and economic circumstances, unspecified: Secondary | ICD-10-CM | POA: Diagnosis not present

## 2023-11-04 DIAGNOSIS — R200 Anesthesia of skin: Secondary | ICD-10-CM | POA: Diagnosis not present

## 2023-11-14 ENCOUNTER — Encounter: Payer: Self-pay | Admitting: Internal Medicine

## 2023-11-14 ENCOUNTER — Ambulatory Visit

## 2023-11-14 ENCOUNTER — Ambulatory Visit: Attending: Internal Medicine | Admitting: Internal Medicine

## 2023-11-14 VITALS — BP 94/64 | HR 81 | Ht 64.0 in | Wt 122.8 lb

## 2023-11-14 DIAGNOSIS — I4819 Other persistent atrial fibrillation: Secondary | ICD-10-CM | POA: Diagnosis not present

## 2023-11-14 DIAGNOSIS — F1011 Alcohol abuse, in remission: Secondary | ICD-10-CM | POA: Diagnosis not present

## 2023-11-14 DIAGNOSIS — I10 Essential (primary) hypertension: Secondary | ICD-10-CM

## 2023-11-14 DIAGNOSIS — E785 Hyperlipidemia, unspecified: Secondary | ICD-10-CM | POA: Diagnosis not present

## 2023-11-14 DIAGNOSIS — I2510 Atherosclerotic heart disease of native coronary artery without angina pectoris: Secondary | ICD-10-CM

## 2023-11-14 MED ORDER — PRAVASTATIN SODIUM 40 MG PO TABS: 40.0000 mg | ORAL_TABLET | Freq: Every day | ORAL | 3 refills | Status: DC

## 2023-11-14 NOTE — Progress Notes (Signed)
 Cardiology Office Note:  .   Date:  11/15/2023  ID:  Christine Garcia, DOB 1960/05/30, MRN [id number] PCP: Sharma Coyer, MD  [facility] Providers Cardiologist:  Lynwood Schilling, MD     History of Present Illness: .   Christine Garcia is a 63 y.o. female with history of paroxysmal atrial fibrillation, nonobstructive coronary artery disease, hypertension, hyperlipidemia, seizure disorder, neuropathy, and alcohol abuse in remission, who has been referred for evaluation of atrial fibrillation and hypertension.  She was previously followed in our practice by Dr. Schilling, having last been seen in 2017.  Today, [name] reports that she has been under quite a bit of stress and has several trips coming up over the next month.  She had been staying with her brother in [place]  last year, as he has cancer, and suffered a mechanical fall leading to a right humerus fracture.  Per her repeat, surgery was delayed for close to a year because a cardiologist would not sign off on proceeding with surgery due to atrial fibrillation.  She would like to reestablish with a cardiology provider in [place], as she is concerned that she has been having quite a bit of atrial fibrillation.  She notes occasional palpitations, usually a few times a week.  She also has rare episodes of sharp chest pain, usually lasting 30 seconds or less.  The pain is usually associated with anxiety.  She notes some exertional dyspnea as well when she overdoes it.  She ambulates with a cane due to significant lower extremity neuropathy attributed to long history of alcohol abuse (she has been sober for 5 months).  She has not been lightheaded or passed out.  [name] believes that she was scheduled for a stress test while in [place]  but could not proceed with the study because of inability to lie flat secondary to chronic back pain.  She had an echocardiogram at [facility] in 2021 that showed normal LVEF.  [name]  states that another provider advised her to increase metoprolol  succinate to 50 mg daily, though she has remained on 25 mg daily as she was nervous to escalate the dose before consulting with a cardiologist.  ROS: See HPI  Studies Reviewed: SABRA   EKG Interpretation Date/Time:  Thursday November 14 2023 09:08:37 EDT Ventricular Rate:  81 PR Interval:    QRS Duration:  76 QT Interval:  358 QTC Calculation: 415 R Axis:   59  Text Interpretation: Atrial fibrillation Septal infarct , age undetermined Abnormal ECG When compared with ECG of 23-Aug-2023 08:08, QRS axis Shifted left Septal infarct is now Present Criteria for Lateral infarct are no longer Present Nonspecific T wave abnormality, worse in Lateral leads QT has shortened Confirmed by Sangeeta Youse [phone]) on 11/14/2023 9:16:20 AM    TTE (09/28/2019, [facility]): Normal LV size with mild LVH.  LVEF > 55%.  Normal RV size and function.  Mild MR and TR.  Mild-moderate pulmonary hypertension (RVSP 42 mmHg).  Risk Assessment/Calculations:    CHA2DS2-VASc Score = 3   This indicates a 3.2% annual risk of stroke. The patient's score is based upon: CHF History: 0 HTN History: 1 Diabetes History: 0 Stroke History: 0 Vascular Disease History: 1 Age Score: 0 Gender Score: 1            Physical Exam:   VS:  BP 94/64 (BP Location: Left Arm, Patient Position: Sitting, Cuff Size: Normal)   Pulse 81   Ht 5' 4 (1.626 m)   Wt  122 lb 12.8 oz (55.7 kg)   LMP 09/15/2011   SpO2 98%   BMI 21.08 kg/m    Wt Readings from Last 3 Encounters:  11/14/23 122 lb 12.8 oz (55.7 kg)  09/16/23 121 lb (54.9 kg)  09/10/23 121 lb (54.9 kg)    General:  NAD. Neck: No JVD or HJR. Lungs: Mildly diminished breath sounds throughout without wheezes or crackles. Heart: Irregularly irregular rhythm without murmurs, rubs, or gallops. Abdomen: Soft, nontender, nondistended. Extremities: No lower extremity edema.  ASSESSMENT AND PLAN: .    Persistent atrial  fibrillation: EKGs today and on 08/23/2023 demonstrates atrial fibrillation, suggestive of persistent atrial fibrillation.  We have agreed to repeat an echocardiogram and also obtain a 14-day event monitor to assess her atrial fibrillation burden and rate control.  In the meantime, we will continue her current regimen of metoprolol  succinate 25 mg daily and apixaban  5 mg twice daily.  CMP, CBC, and TSH in 08/2023 were unremarkable.  Hypertension: BP borderline low today, albeit without symptoms.  We will continue metoprolol  succinate 25 mg daily.  Alcohol abuse in remission: [name] has a history of heavy alcohol abuse but reports being sober for ~5 months.  I congratulated her on this and encouraged her to refrain from drinking alcohol.  She raises the possibility of alcoholic cardiomyopathy.  As above, we will repeat a transthoracic echocardiogram at her convenience.  Coronary artery disease and hyperlipidemia: Mild, non-obstructive CAD noted on remote catheterization.  [name] does not report angina.  We will continue apixaban  in lieu of aspirin , given atrial fibrillation.  LDL was moderately elevated at 157 on last check in 08/2023.  We have agreed to increase pravastatin  to 40 mg daily.  We will recheck a lipid panel and ALT when she returns for follow-up in 3 months, with low threshold to escalate to high-intensity statin therapy if LDL remains above 100.    Dispo: Return to clinic in 3 months.  Signed, Lonni Hanson, MD

## 2023-11-14 NOTE — Patient Instructions (Signed)
 Medication Instructions:  Your physician recommends the following medication changes.  INCREASE: Pravastatin  to 40 mg by mouth daily    *If you need a refill on your cardiac medications before your next appointment, please call your pharmacy*  Lab Work: No labs ordered today    Testing/Procedures: Your physician has requested that you have an echocardiogram. Echocardiography is a painless test that uses sound waves to create images of your heart. It provides your doctor with information about the size and shape of your heart and how well your heart's chambers and valves are working.   You may receive an ultrasound enhancing agent through an IV if needed to better visualize your heart during the echo. This procedure takes approximately one hour.  There are no restrictions for this procedure.  This will take place at [address] [facility] [address] [facility] [address], [place] [address]  Please note: We ask at that you not bring children with you during ultrasound (echo/ vascular) testing. Due to room size and safety concerns, children are not allowed in the ultrasound rooms during exams. Our front office staff cannot provide observation of children in our lobby area while testing is being conducted. An adult accompanying a patient to their appointment will only be allowed in the ultrasound room at the discretion of the ultrasound technician under special circumstances. We apologize for any inconvenience.   Your physician has recommended that you wear a 14 day Zio monitor.   This monitor is a medical device that records the heart's electrical activity. Doctors most often use these monitors to diagnose arrhythmias. Arrhythmias are problems with the speed or rhythm of the heartbeat. The monitor is a small device applied to your chest. You can wear one while you do your normal daily activities. While wearing this monitor if you have any symptoms to push the button and record what you felt. Once you  have worn this monitor for the period of time provider prescribed (Usually 14 days), you will return the monitor device in the postage paid box. Once it is returned they will download the data collected and provide us  with a report which the provider will then review and we will call you with those results. Important tips:  Avoid showering during the first 24 hours of wearing the monitor. Avoid excessive sweating to help maximize wear time. Do not submerge the device, no hot tubs, and no swimming pools. Keep any lotions or oils away from the patch. After 24 hours you may shower with the patch on. Take brief showers with your back facing the shower head.  Do not remove patch once it has been placed because that will interrupt data and decrease adhesive wear time. Push the button when you have any symptoms and write down what you were feeling. Once you have completed wearing your monitor, remove and place into box which has postage paid and place in your outgoing mailbox.  If for some reason you have misplaced your box then call our office and we can provide another box and/or mail it off for you.     Follow-Up: At [facility], you and your health needs are our priority.  As part of our continuing mission to provide you with exceptional heart care, our providers are all part of one team.  This team includes your primary Cardiologist (physician) and Advanced Practice Providers or APPs (Physician Assistants and Nurse Practitioners) who all work together to provide you with the care you need, when you need it.  Your next appointment:   3 month(s)  Provider:   You may see Lonni Hanson, MD or one of the following Advanced Practice Providers on your designated Care Team:   Lonni Meager, NP Lesley Maffucci, PA-C Bernardino Bring, PA-C Cadence [place], PA-C Tylene Lunch, NP Barnie Hila, NP

## 2023-11-15 ENCOUNTER — Encounter: Payer: Self-pay | Admitting: Internal Medicine

## 2023-11-15 DIAGNOSIS — F1011 Alcohol abuse, in remission: Secondary | ICD-10-CM | POA: Insufficient documentation

## 2023-12-05 ENCOUNTER — Ambulatory Visit: Attending: Internal Medicine

## 2023-12-05 DIAGNOSIS — I4819 Other persistent atrial fibrillation: Secondary | ICD-10-CM | POA: Diagnosis not present

## 2023-12-05 LAB — ECHOCARDIOGRAM COMPLETE
AR max vel: 2.67 cm2
AV Area VTI: 2.47 cm2
AV Area mean vel: 2.61 cm2
AV Mean grad: 3 mmHg
AV Peak grad: 4.9 mmHg
Ao pk vel: 1.11 m/s
S' Lateral: 2.79 cm

## 2023-12-06 ENCOUNTER — Ambulatory Visit: Payer: Self-pay | Admitting: Internal Medicine

## 2023-12-06 DIAGNOSIS — I4819 Other persistent atrial fibrillation: Secondary | ICD-10-CM

## 2023-12-21 ENCOUNTER — Other Ambulatory Visit: Payer: Self-pay | Admitting: Family Medicine

## 2023-12-29 DIAGNOSIS — R569 Unspecified convulsions: Secondary | ICD-10-CM | POA: Diagnosis not present

## 2023-12-30 DIAGNOSIS — M810 Age-related osteoporosis without current pathological fracture: Secondary | ICD-10-CM | POA: Diagnosis not present

## 2024-01-02 ENCOUNTER — Telehealth: Payer: Self-pay | Admitting: Internal Medicine

## 2024-01-02 NOTE — Telephone Encounter (Signed)
  Pt would like to let Dr. Mady that she will be wearing her heart monitor today

## 2024-01-02 NOTE — Telephone Encounter (Signed)
 Patient wanted to let us  know that she is going to start wearing her monitor now. The delays were because of some family emergency that she is dealing with. Advised to let us  know if she should have any further questions or needs.

## 2024-01-09 ENCOUNTER — Ambulatory Visit: Admitting: Family Medicine

## 2024-01-09 ENCOUNTER — Encounter: Payer: Self-pay | Admitting: Family Medicine

## 2024-01-09 VITALS — BP 106/83 | HR 78 | Resp 16 | Wt 129.2 lb

## 2024-01-09 DIAGNOSIS — M810 Age-related osteoporosis without current pathological fracture: Secondary | ICD-10-CM

## 2024-01-09 DIAGNOSIS — E781 Pure hyperglyceridemia: Secondary | ICD-10-CM

## 2024-01-09 DIAGNOSIS — M51362 Other intervertebral disc degeneration, lumbar region with discogenic back pain and lower extremity pain: Secondary | ICD-10-CM | POA: Diagnosis not present

## 2024-01-09 DIAGNOSIS — Z23 Encounter for immunization: Secondary | ICD-10-CM | POA: Diagnosis not present

## 2024-01-09 DIAGNOSIS — R569 Unspecified convulsions: Secondary | ICD-10-CM

## 2024-01-09 DIAGNOSIS — F419 Anxiety disorder, unspecified: Secondary | ICD-10-CM

## 2024-01-09 DIAGNOSIS — G629 Polyneuropathy, unspecified: Secondary | ICD-10-CM

## 2024-01-09 DIAGNOSIS — J3089 Other allergic rhinitis: Secondary | ICD-10-CM

## 2024-01-09 DIAGNOSIS — I2510 Atherosclerotic heart disease of native coronary artery without angina pectoris: Secondary | ICD-10-CM

## 2024-01-09 DIAGNOSIS — I480 Paroxysmal atrial fibrillation: Secondary | ICD-10-CM

## 2024-01-09 DIAGNOSIS — K219 Gastro-esophageal reflux disease without esophagitis: Secondary | ICD-10-CM

## 2024-01-09 DIAGNOSIS — I10 Essential (primary) hypertension: Secondary | ICD-10-CM

## 2024-01-09 DIAGNOSIS — E785 Hyperlipidemia, unspecified: Secondary | ICD-10-CM

## 2024-01-09 DIAGNOSIS — M5430 Sciatica, unspecified side: Secondary | ICD-10-CM

## 2024-01-09 MED ORDER — PREDNISONE 20 MG PO TABS: ORAL_TABLET | ORAL | 0 refills | Status: AC

## 2024-01-09 MED ORDER — AZELASTINE HCL 0.1 % NA SOLN: 1.0000 | Freq: Two times a day (BID) | NASAL | 3 refills | Status: AC

## 2024-01-09 MED ORDER — CLONAZEPAM 0.5 MG PO TABS
0.5000 mg | ORAL_TABLET | Freq: Two times a day (BID) | ORAL | 1 refills | Status: DC | PRN
Start: 2024-01-09 — End: 2024-03-10

## 2024-01-09 MED ORDER — BUSPIRONE HCL 15 MG PO TABS: 15.0000 mg | ORAL_TABLET | Freq: Two times a day (BID) | ORAL | 2 refills | Status: DC

## 2024-01-09 NOTE — Progress Notes (Signed)
 Established patient visit   Patient: Christine Garcia   DOB: May 28, 1960   63 y.o. Female  MRN: [id number] Visit Date: 01/09/2024  Today's healthcare provider: Rockie Agent, MD   Chief Complaint  Patient presents with   Medical Management of Chronic Issues    Follow-up 4 months   Subjective     HPI     Medical Management of Chronic Issues    Additional comments: Follow-up 4 months      Last edited by Rosas, Joseline E, CMA on 01/09/2024 10:53 AM.       Discussed the use of AI scribe software for clinical note transcription with the patient, who gave verbal consent to proceed.  History of Present Illness Christine Garcia is a 63 year old female who presents for chronic follow-up.  She has a history of osteoporosis and has discontinued Fosamax due to side effects. She is awaiting monthly injections as advised by her endocrinologist.  She experiences anxiety and is currently on buspirone  5 mg twice daily and Cymbalta  daily. She feels overwhelmed due to family stressors.  She has a history of atrial fibrillation and is on Eliquis  5 mg twice daily. Her blood pressure was noted to be 106/83, which she does not find concerning.  She has a history of coronary artery disease, hypertension, hyperlipidemia, and hypertriglyceridemia. She continues metoprolol  25 mg daily and pravastatin  40 mg daily.  She has a history of reflux and continues Prevacid  30 mg twice daily before meals.  She has a history of seizure disorder and is on Keppra 10 ml twice daily and valproic acid 250 mg daily. She recently had an EEG.  She reports chronic back pain, particularly in the lower spine. She has discontinued gabapentin  and hydrocodone .  She experiences frequent headaches and has a history of migraines. She has not discussed this with her neurologist yet.  She reports nasal congestion and frequent use of tissues, possibly due to dust in her living environment.  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     Past Medical History:  Diagnosis Date   Anxiety    Atrial fibrillation (HCC)    Complication of anesthesia    states is hard to numb   Dupuytren's contracture of right hand 08/13/2014   ring/middle fingers   GERD (gastroesophageal reflux disease)    High triglycerides    History of atrial fibrillation    PAF with RVR 11/2011 & 09/2019   Hypertension    under control with med., has been on med. x 2 yr.   Migraines    occasional   Murmur    trivial AR, mild MR/TR 09/28/19 TTE   Neuropathy    Seasonal allergies    Seizures (HCC)    last seizure in 2022 per patient   Substance abuse (HCC)    pt reports ETOH abuse in past    Medications: Outpatient Medications Prior to Visit  Medication Sig   DULoxetine  (CYMBALTA ) 60 MG capsule TAKE 1 CAPSULE BY MOUTH EVERY DAY   ELIQUIS  5 MG TABS tablet Take 1 tablet (5 mg total) by mouth 2 (two) times daily.   folic acid  (FOLVITE ) 1 MG tablet TAKE 1 TABLET BY MOUTH EVERY DAY   furosemide (LASIX) 20 MG tablet TAKE 1 TABLET BY MOUTH DAILY   lansoprazole  (PREVACID ) 30 MG capsule Take 1 capsule (30 mg total) by mouth 2 (two) times daily before a meal.  levETIRAcetam (KEPPRA) 100 MG/ML solution Take 10 mLs by mouth 2 (two) times daily.   metoprolol  succinate (TOPROL -XL) 25 MG 24 hr tablet Take 1 tablet (25 mg total) by mouth daily.   pravastatin  (PRAVACHOL ) 40 MG tablet Take 1 tablet (40 mg total) by mouth daily.   tetrahydrozoline 0.05 % ophthalmic solution Place 1 drop into both eyes daily as needed (Dry eye).   traZODone  (DESYREL ) 50 MG tablet Take 1 tablet (50 mg total) by mouth at bedtime.   valproic acid (DEPAKENE) 250 MG capsule TAKE 2 CAPSULES BY MOUTH EVERY 12 HOURS   [DISCONTINUED] busPIRone  (BUSPAR ) 5 MG tablet Take 1 tablet (5 mg total) by mouth 2 (two) times daily.   diclofenac  Sodium (VOLTAREN ) 1 % GEL Apply 4 g topically 4 (four) times  daily. (Patient not taking: Reported on 11/14/2023)   naloxone [facility]) nasal spray 4 mg/0.1 mL Narcan 4 mg/actuation nasal spray  1 spray in one nostril may repeat dose every 2-3 mins until patient is responsive or EMS arrives.  use as needed for opiate overdose. (Patient not taking: Reported on 11/14/2023)   [DISCONTINUED] acetaminophen  (TYLENOL ) 325 MG tablet Take 650 mg by mouth every 6 (six) hours as needed for mild pain (pain score 1-3) or moderate pain (pain score 4-6).   [DISCONTINUED] alendronate (FOSAMAX) 70 MG tablet Take 70 mg by mouth once a week. Take with a full glass of water on an empty stomach. (Patient not taking: Reported on 11/14/2023)   [DISCONTINUED] gabapentin  (NEURONTIN ) 300 MG capsule Take 2 capsules (600 mg total) by mouth 3 (three) times daily.   [DISCONTINUED] HYDROcodone -acetaminophen  (NORCO/VICODIN) 5-325 MG tablet Take 1 tablet by mouth every 6 (six) hours as needed for severe pain (pain score 7-10). (Patient not taking: Reported on 11/14/2023)   [DISCONTINUED] hydrOXYzine  (ATARAX ) 25 MG tablet Take 1 tablet (25 mg total) by mouth every 6 (six) hours as needed. (Patient not taking: Reported on 11/14/2023)   [DISCONTINUED] hydrOXYzine  (ATARAX ) 50 MG tablet TAKE 2 TABLETS BY MOUTH TWICE A DAY AS NEEDED (Patient not taking: Reported on 11/14/2023)   [DISCONTINUED] Multiple Vitamins-Minerals (MULTIVITAMIN WITH MINERALS) tablet Take 1 tablet by mouth daily.   No facility-administered medications prior to visit.      01/09/2024   10:54 AM 07/31/2023   10:36 AM  GAD 7 : Generalized Anxiety Score  Nervous, Anxious, on Edge 0 0  Control/stop worrying 0 0  Worry too much - different things 1 1  Trouble relaxing 0   Restless 0 0  Easily annoyed or irritable 0 0  Afraid - awful might happen 0 0  Total GAD 7 Score 1   Anxiety Difficulty Not difficult at all Not difficult at all      Review of Systems  Last metabolic panel Lab Results  Component Value Date   GLUCOSE 96  08/13/2023   NA 135 08/13/2023   K 4.6 08/13/2023   CL 91 (L) 08/13/2023   CO2 27 08/13/2023   BUN 12 08/13/2023   CREATININE 0.75 08/13/2023   EGFR 90 08/13/2023   CALCIUM 9.6 08/13/2023   PROT 6.7 08/13/2023   ALBUMIN 4.5 08/13/2023   LABGLOB 2.2 08/13/2023   BILITOT 0.4 08/13/2023   ALKPHOS 81 08/13/2023   AST 20 08/13/2023   ALT 15 08/13/2023   ANIONGAP 9 01/11/2015   Last lipids Lab Results  Component Value Date   CHOL 229 (H) 08/13/2023   HDL 62 08/13/2023   LDLCALC 157 (H) 08/13/2023   LDLDIRECT 85.5  02/01/2014   TRIG 59 08/13/2023   CHOLHDL 3.7 08/13/2023   Last hemoglobin A1c Lab Results  Component Value Date   HGBA1C 5.2 08/13/2023   Last thyroid  functions Lab Results  Component Value Date   TSH 1.250 08/13/2023   Last vitamin D  Lab Results  Component Value Date   VD25OH 57.9 08/13/2023   Last vitamin B12 and Folate Lab Results  Component Value Date   VITAMINB12 760 08/13/2023   FOLATE 9.1 05/12/2010        Objective    BP 106/83 (BP Location: Left Arm, Patient Position: Sitting, Cuff Size: Normal)   Pulse 78   Resp 16   Wt 129 lb 3.2 oz (58.6 kg)   LMP 09/15/2011   SpO2 99%   BMI 22.18 kg/m   BP Readings from Last 3 Encounters:  01/09/24 106/83  11/14/23 94/64  09/16/23 104/64   Wt Readings from Last 3 Encounters:  01/09/24 129 lb 3.2 oz (58.6 kg)  11/14/23 122 lb 12.8 oz (55.7 kg)  09/16/23 121 lb (54.9 kg)        Physical Exam Psychiatric:        Attention and Perception: Attention normal.        Mood and Affect: Mood normal. Mood is not anxious or depressed. Affect is not tearful or inappropriate.        Speech: Speech normal. Speech is not rapid and pressured.        Behavior: Behavior normal. Behavior is not agitated, aggressive or hyperactive. Behavior is cooperative.        Thought Content: Thought content normal.     MSK: RUE healing surgical scar, right lumbar region tenderness to palpation  CARD: irregular  heart rhythm PULM: CTAB    No results found for any visits on 01/09/24.  Assessment & Plan     Problem List Items Addressed This Visit     Age related osteoporosis   Allergic rhinitis   Relevant Medications   azelastine  (ASTELIN ) 0.1 % nasal spray   Anxiety   Relevant Medications   busPIRone  (BUSPAR ) 15 MG tablet   clonazePAM  (KLONOPIN ) 0.5 MG tablet   Atrial fibrillation (HCC)   Coronary atherosclerosis of native coronary artery   Degeneration of intervertebral disc of lumbar region   Essential hypertension   GERD   Hyperlipidemia with target LDL less than 100   Hypertriglyceridemia   Polyneuropathy   Relevant Medications   busPIRone  (BUSPAR ) 15 MG tablet   clonazePAM  (KLONOPIN ) 0.5 MG tablet   Seizure (HCC)   Relevant Medications   clonazePAM  (KLONOPIN ) 0.5 MG tablet   Other Visit Diagnoses       Immunization due    -  Primary   Relevant Orders   Flu vaccine trivalent PF, 6mos and older(Flulaval,Afluria,Fluarix,Fluzone) (Completed)     Acute sciatica       Relevant Medications   busPIRone  (BUSPAR ) 15 MG tablet   clonazePAM  (KLONOPIN ) 0.5 MG tablet   predniSONE  (DELTASONE ) 20 MG tablet       Assessment & Plan Osteoporosis Chronic condition  Transitioning from oral bisphosphonates to monthly Evenity injections due to side effects from Fosamax. - Coordinate with endocrinologist for monthly Evenity injections - Follow up on DEXA scan results  Anxiety disorder Chronic anxiety managed with buspirone  and duloxetine . Concerns about efficacy and inquiry about Xanax, advised against due to risks. Plan to increase buspirone  and introduce Klonopin  for acute management. - Increase buspirone  to 15 mg twice daily - Prescribe Klonopin  0.5 mg  twice daily for acute anxiety management  Atrial fibrillation Chronic atrial fibrillation managed with Eliquis  5mg  BID  - continue Eliquis  and metoprolol  25mg  daily  .   Essential hypertension Chronic  Well controlled  Blood  pressure well-controlled at 106/83 mmHg. - continue metoprolol  25mg  daily  - continue lasix 20mg  daily   Gastroesophageal reflux disease Chronic GERD managed with Prevacid . - Continue Prevacid  30 mg twice daily  Hyperlipidemia and hypertriglyceridemia Chronic hyperlipidemia managed with pravastatin . - Continue pravastatin  40 mg daily  Polyneuropathy Chronic polyneuropathy with ongoing pain. Gabapentin  discontinued due to lack of efficacy. Considering Lyrica for potential sciatic nerve involvement. - Consider Lyrica if neuropathy symptoms persist  Degenerative lumbar disc disease with chronic back and lower extremity pain and sciatica Chronic back pain with recent exacerbation, possibly due to increased activity. Pain localized to lower spine and hip, with suspicion of sciatica. Potential benefit of prednisone  for inflammation. - Prescribe prednisone  40 mg daily for 3 days, then 20 mg daily for 3 days - Refer back to her established neurosurgeon for further evaluation and management  Seizure disorder Chronic  Seizure disorder managed with Keppra and valproic acid. Potential medication adjustments discussed with neurologist. - Continue Keppra 10 mL twice daily - Continue valproic acid 250 mg daily - Continue to follow up with neurology as scheduled   Chronic pain syndrome Chronic pain syndrome managed with duloxetine .  Allergic rhinitis Allergic rhinitis likely due to environmental factors such as dust. - Prescribe azelastine  nasal spray, one spray in each nostril twice daily     Return in about 2 months (around 03/10/2024) for Chronic F/U.         Rockie Agent, MD  [facility] [phone] (phone) [phone] (fax)  [facility]

## 2024-01-22 DIAGNOSIS — I4819 Other persistent atrial fibrillation: Secondary | ICD-10-CM | POA: Diagnosis not present

## 2024-01-22 DIAGNOSIS — M810 Age-related osteoporosis without current pathological fracture: Secondary | ICD-10-CM | POA: Diagnosis not present

## 2024-01-24 ENCOUNTER — Telehealth: Payer: Self-pay | Admitting: Physician Assistant

## 2024-01-24 NOTE — Telephone Encounter (Signed)
 I called the patient back and advised that I would contact [facility] about getting her results on Monday as their office closes at 12pm on Friday. Our office will contact her to schedule a follow up.  Media Information  Document Information  AMB Correspondence  NEUROSURGERY ANSWERING SERVICE  01/23/2024 12:01  Attached To:  [name]  Source Information  Default, Provider, MD

## 2024-01-27 NOTE — Telephone Encounter (Signed)
 I spoke with Ethelene at [facility] and she will fax over patient's EEG results.

## 2024-01-27 NOTE — Telephone Encounter (Signed)
 I spoke with Megan at [facility] and received clarification for this patient. Patient never completed her EMG ordered by Lyle. Patient has been notified that she needs to complete her EMG so that Lyle can determine a treatment plan for her neuropathy. Patient expressed understanding and is scheduled for 10/20.

## 2024-02-01 DIAGNOSIS — I4819 Other persistent atrial fibrillation: Secondary | ICD-10-CM

## 2024-02-14 ENCOUNTER — Ambulatory Visit: Admitting: Medical

## 2024-02-19 ENCOUNTER — Other Ambulatory Visit: Payer: Self-pay | Admitting: Family Medicine

## 2024-03-01 ENCOUNTER — Other Ambulatory Visit: Payer: Self-pay | Admitting: Family Medicine

## 2024-03-01 DIAGNOSIS — F419 Anxiety disorder, unspecified: Secondary | ICD-10-CM

## 2024-03-10 ENCOUNTER — Encounter: Payer: Self-pay | Admitting: Physician Assistant

## 2024-03-10 ENCOUNTER — Ambulatory Visit: Attending: Physician Assistant | Admitting: Physician Assistant

## 2024-03-10 VITALS — BP 100/80 | HR 77 | Ht 63.0 in | Wt 130.2 lb

## 2024-03-10 DIAGNOSIS — I10 Essential (primary) hypertension: Secondary | ICD-10-CM | POA: Diagnosis not present

## 2024-03-10 DIAGNOSIS — F1011 Alcohol abuse, in remission: Secondary | ICD-10-CM | POA: Diagnosis not present

## 2024-03-10 DIAGNOSIS — I2510 Atherosclerotic heart disease of native coronary artery without angina pectoris: Secondary | ICD-10-CM

## 2024-03-10 DIAGNOSIS — I4819 Other persistent atrial fibrillation: Secondary | ICD-10-CM | POA: Diagnosis not present

## 2024-03-10 DIAGNOSIS — Z79899 Other long term (current) drug therapy: Secondary | ICD-10-CM

## 2024-03-10 DIAGNOSIS — E785 Hyperlipidemia, unspecified: Secondary | ICD-10-CM | POA: Diagnosis not present

## 2024-03-10 NOTE — Patient Instructions (Signed)
 Medication Instructions:  Your physician recommends that you continue on your current medications as directed. Please refer to the Current Medication list given to you today.   *If you need a refill on your cardiac medications before your next appointment, please call your pharmacy*  Lab Work: Your provider would like for you to have following labs drawn today Lipids, Direct LDL, and Liver function panel.   If you have labs (blood work) drawn today and your tests are completely normal, you will receive your results only by: MyChart Message (if you have MyChart) OR A paper copy in the mail If you have any lab test that is abnormal or we need to change your treatment, we will call you to review the results.  Follow-Up: At [facility], you and your health needs are our priority.  As part of our continuing mission to provide you with exceptional heart care, our providers are all part of one team.  This team includes your primary Cardiologist (physician) and Advanced Practice Providers or APPs (Physician Assistants and Nurse Practitioners) who all work together to provide you with the care you need, when you need it.  Your next appointment:   3-4 month(s)  Provider:   You may see Lynwood Schilling, MD or one of the following Advanced Practice Providers on your designated Care Team:   Lonni Meager, NP Lesley Maffucci, PA-C Bernardino Bring, PA-C Cadence Badger, PA-C Tylene Lunch, NP Barnie Hila, NP

## 2024-03-10 NOTE — Progress Notes (Signed)
 Cardiology Office Note    Date:  03/10/2024   ID:  Christine Garcia, DOB 06/12/1960, MRN [id number]  PCP:  Sharma Coyer, MD  Cardiologist:  Lynwood Schilling, MD  Electrophysiologist:  None   Chief Complaint: Follow up  History of Present Illness:   Christine Garcia is a 63 y.o. female with history of persistent atrial fibrillation, nonobstructive CAD, hypertension, hyperlipidemia, seizure disorder, neuropathy, and alcohol abuse in remission who is being seen for follow-up on atrial fibrillation.  Patient recently reestablished with our practice 11/14/2023.  She had been taking care of her brother with cancer in [place]  where she suffered a mechanical fall leading to right humerus fracture.  Per report, surgery was delayed for close to a year due to her cardiologist not signing off on proceeding with surgery due to atrial fibrillation.  At that visit, she reported occasional palpitations with rare episodes of sharp chest pain usually lasting 30 seconds or less.  She reported the pain was associated with anxiety.  She reported exertional dyspnea when she overdid it.  Echocardiogram 11/2023 showed EF 60 to 65% with no RWMA, moderate asymmetric LVH, severely dilated left atrium, and mild MR.  Long-term monitor showed 100% atrial fibrillation burden with average rate of 89 bpm with rare PVCs.  Patient presents today overall doing well from a cardiac perspective.  She reports ongoing intermittent episodes of palpitations, although she reports she has gotten used to these and they are not bothersome to her.  She has mild dyspnea on exertion which is unchanged from baseline.  She denies chest pain, lightheadedness, dizziness, and lower extremity swelling.  Labs independently reviewed: 12/2023-sodium 136, potassium 4.7, BUN 14, creatinine 0.6 08/2023-Hgb 13.3, HCT 40.0, platelets 321, TC 229, TG 59, HDL 62, LDL 157, normal TSH/T4  Objective   Past Medical History:  Diagnosis Date    Anxiety    Atrial fibrillation (HCC)    Complication of anesthesia    states is hard to numb   Dupuytren's contracture of right hand 08/13/2014   ring/middle fingers   GERD (gastroesophageal reflux disease)    High triglycerides    History of atrial fibrillation    PAF with RVR 11/2011 & 09/2019   Hypertension    under control with med., has been on med. x 2 yr.   Migraines    occasional   Murmur    trivial AR, mild MR/TR 09/28/19 TTE   Neuropathy    Seasonal allergies    Seizures (HCC)    last seizure in 2022 per patient   Substance abuse (HCC)    pt reports ETOH abuse in past    Current Medications: Current Meds  Medication Sig   azelastine  (ASTELIN ) 0.1 % nasal spray Place 1 spray into both nostrils 2 (two) times daily. Use in each nostril as directed   busPIRone  (BUSPAR ) 15 MG tablet TAKE 1 TABLET BY MOUTH 2 TIMES DAILY.   DULoxetine  (CYMBALTA ) 60 MG capsule TAKE 1 CAPSULE BY MOUTH EVERY DAY   ELIQUIS  5 MG TABS tablet Take 1 tablet (5 mg total) by mouth 2 (two) times daily.   folic acid  (FOLVITE ) 1 MG tablet TAKE 1 TABLET BY MOUTH EVERY DAY   furosemide (LASIX) 20 MG tablet TAKE 1 TABLET BY MOUTH DAILY   lansoprazole  (PREVACID ) 30 MG capsule Take 1 capsule (30 mg total) by mouth 2 (two) times daily before a meal.   levETIRAcetam (KEPPRA) 100 MG/ML solution Take 10 mLs by mouth 2 (two) times daily.  metoprolol  succinate (TOPROL -XL) 25 MG 24 hr tablet Take 1 tablet (25 mg total) by mouth daily.   pravastatin  (PRAVACHOL ) 40 MG tablet Take 1 tablet (40 mg total) by mouth daily.   tetrahydrozoline 0.05 % ophthalmic solution Place 1 drop into both eyes daily as needed (Dry eye).   traZODone  (DESYREL ) 50 MG tablet TAKE 1 TABLET BY MOUTH EVERYDAY AT BEDTIME   valproic acid (DEPAKENE) 250 MG capsule TAKE 2 CAPSULES BY MOUTH EVERY 12 HOURS    Allergies:   Penicillins   Social History   Socioeconomic History   Marital status: Married    Spouse name: Not on file   Number of  children: 1   Years of education: Not on file   Highest education level: 12th grade  Occupational History   Occupation: Control And Instrumentation Engineer: [employer]  Tobacco Use   Smoking status: Never   Smokeless tobacco: Never  Vaping Use   Vaping status: Never Used  Substance and Sexual Activity   Alcohol use: Not Currently    Comment: hx ETOH use- has not drank in 68 days as of 08/21/23   Drug use: Yes    Frequency: 21.0 times per week    Types: Marijuana    Comment: medicated marijuana- TID   Sexual activity: Yes  Other Topics Concern   Not on file  Social History Narrative   Not on file   Social Drivers of Health   Financial Resource Strain: Low Risk  (01/07/2024)   Overall Financial Resource Strain (CARDIA)    Difficulty of Paying Living Expenses: Not very hard  Recent Concern: Financial Resource Strain - High Risk (11/04/2023)   Received from [facility]   Overall Financial Resource Strain (CARDIA)    Difficulty of Paying Living Expenses: Very hard  Food Insecurity: No Food Insecurity (01/07/2024)   Hunger Vital Sign    Worried About Running Out of Food in the Last Year: Never true    Ran Out of Food in the Last Year: Never true  Transportation Needs: No Transportation Needs (01/07/2024)   PRAPARE - Administrator, Civil Service (Medical): No    Lack of Transportation (Non-Medical): No  Recent Concern: Transportation Needs - Unmet Transportation Needs (11/04/2023)   Received from [facility] - [facility]    In the past 12 months, has lack of transportation kept you from medical appointments or from getting medications?: Yes    Lack of Transportation (Non-Medical): No  Physical Activity: Insufficiently Active (01/07/2024)   Exercise Vital Sign    Days of Exercise per Week: 2 days    Minutes of Exercise per Session: 30 min  Stress: No Stress Concern Present (01/07/2024)   [employer] of Occupational Health - Occupational  Stress Questionnaire    Feeling of Stress: Only a little  Social Connections: Moderately Isolated (01/07/2024)   Social Connection and Isolation Panel    Frequency of Communication with Friends and Family: More than three times a week    Frequency of Social Gatherings with Friends and Family: Once a week    Attends Religious Services: Never    Database Administrator or Organizations: No    Attends Engineer, Structural: Not on file    Marital Status: Married     Family History:  The patient's family history includes COPD in her father; CVA in her father; Congestive Heart Failure in her father; Heart attack in her father and mother; Heart  attack (age of onset: 90) in her sister; Stroke in her mother.  ROS:   12-point review of systems is negative unless otherwise noted in the HPI.  EKGs/Other Studies Reviewed:    Studies reviewed were summarized above. The additional studies were reviewed today:  01/30/2024 long-term monitor   The patient was monitored for 12 days, 22 hours.   The predominant rhythm was atrial fibrillation (100% burden) with an average ventricular rate of 89 bpm (range 58-159 bpm).   Rare PVCs were observed.   There was no prolonged pause.   No patient triggered events were recorded.  12/05/2023 echo complete 1. Left ventricular ejection fraction, by estimation, is 60 to 65%. The  left ventricle has normal function. The left ventricle has no regional  wall motion abnormalities. There is moderate asymmetric left ventricular  hypertrophy of the basal-septal  segment. Left ventricular diastolic parameters are indeterminate.   2. Right ventricular systolic function is normal. The right ventricular  size is normal. There is normal pulmonary artery systolic pressure. The  estimated right ventricular systolic pressure is 31.8 mmHg.   3. Left atrial size was severely dilated.   4. The mitral valve is normal in structure. Mild mitral valve  regurgitation. No  evidence of mitral stenosis.   5. Tricuspid valve regurgitation is mild to moderate.   6. The aortic valve is normal in structure. Aortic valve regurgitation is  not visualized. No aortic stenosis is present.   7. The inferior vena cava is normal in size with greater than 50%  respiratory variability, suggesting right atrial pressure of 3 mmHg.   EKG:  EKG personally reviewed by me today    PHYSICAL EXAM:    VS:  BP 100/80 (BP Location: Left Arm, Patient Position: Sitting, Cuff Size: Normal)   Pulse 77 Comment: 76 oximeter  Ht 5' 3 (1.6 m)   Wt 130 lb 3.2 oz (59.1 kg)   LMP 09/15/2011   SpO2 98%   BMI 23.06 kg/m   BMI: Body mass index is 23.06 kg/m.  GEN: Well nourished, well developed in no acute distress NECK: No JVD; No carotid bruits CARDIAC: IRIR, no murmurs, rubs, gallops RESPIRATORY:  Clear to auscultation without rales, wheezing or rhonchi  ABDOMEN: Soft, non-tender, non-distended EXTREMITIES: No edema; No deformity  Wt Readings from Last 3 Encounters:  03/10/24 130 lb 3.2 oz (59.1 kg)  01/09/24 129 lb 3.2 oz (58.6 kg)  11/14/23 122 lb 12.8 oz (55.7 kg)                  ASSESSMENT & PLAN:   Persistent atrial fibrillation - ZIO monitor showed 100% atrial fibrillation burden.  Echo with preserved LV systolic function and severely dilated left atrium.  EKG today shows atrial fibrillation.  She is minimally symptomatic.  Given this, recommend continued rate control strategy with metoprolol  succinate 25 mg daily.  She is continued on Eliquis  5 mg twice daily for CHA2DS2-VASc of 3.  She has been referred to EP and is scheduled to be seen in January to further discuss management of persistent atrial fibrillation.  Coronary artery disease Hyperlipidemia - Mild nonobstructive CAD noted on remote catheterization.  No symptoms of angina.  She is continued on Eliquis  in place of aspirin .  LDL of 157 on 08/2023.  Pravastatin  was increased to 40 mg daily at prior visit.   Recommend updating lipid panel with direct LDL and hepatic function today with low threshold to escalate to high intensity statin if LDL remains  above 100.  Hypertension - BP well-controlled on current regimen.  Alcohol abuse in remission - History of heavy alcohol abuse but has been sober since the beginning of this year.   Disposition: Check lipid panel with direct LDL and hepatic function.  F/u with Dr. Mady or an APP in 3 to 4 months.   Medication Adjustments/Labs and Tests Ordered: Current medicines are reviewed at length with the patient today.  Concerns regarding medicines are outlined above. Medication changes, Labs and Tests ordered today are summarized above and listed in the Patient Instructions accessible in Encounters.   Signed, Lesley Maffucci, PA-C 03/10/2024 3:35 PM     [facility] [place] [address] [place], [place] [address] [phone]

## 2024-03-11 ENCOUNTER — Ambulatory Visit: Payer: Self-pay | Admitting: Physician Assistant

## 2024-03-11 ENCOUNTER — Encounter: Payer: Self-pay | Admitting: Family Medicine

## 2024-03-11 ENCOUNTER — Ambulatory Visit: Admitting: Emergency Medicine

## 2024-03-11 ENCOUNTER — Other Ambulatory Visit: Payer: Self-pay | Admitting: Family Medicine

## 2024-03-11 ENCOUNTER — Telehealth (INDEPENDENT_AMBULATORY_CARE_PROVIDER_SITE_OTHER): Admitting: Family Medicine

## 2024-03-11 VITALS — Ht 63.0 in | Wt 130.0 lb

## 2024-03-11 DIAGNOSIS — I2510 Atherosclerotic heart disease of native coronary artery without angina pectoris: Secondary | ICD-10-CM

## 2024-03-11 DIAGNOSIS — I10 Essential (primary) hypertension: Secondary | ICD-10-CM

## 2024-03-11 DIAGNOSIS — F5105 Insomnia due to other mental disorder: Secondary | ICD-10-CM

## 2024-03-11 DIAGNOSIS — F409 Phobic anxiety disorder, unspecified: Secondary | ICD-10-CM

## 2024-03-11 DIAGNOSIS — Z0000 Encounter for general adult medical examination without abnormal findings: Secondary | ICD-10-CM

## 2024-03-11 DIAGNOSIS — E785 Hyperlipidemia, unspecified: Secondary | ICD-10-CM

## 2024-03-11 DIAGNOSIS — R569 Unspecified convulsions: Secondary | ICD-10-CM

## 2024-03-11 DIAGNOSIS — E559 Vitamin D deficiency, unspecified: Secondary | ICD-10-CM

## 2024-03-11 DIAGNOSIS — F419 Anxiety disorder, unspecified: Secondary | ICD-10-CM | POA: Diagnosis not present

## 2024-03-11 DIAGNOSIS — F418 Other specified anxiety disorders: Secondary | ICD-10-CM

## 2024-03-11 DIAGNOSIS — M810 Age-related osteoporosis without current pathological fracture: Secondary | ICD-10-CM | POA: Diagnosis not present

## 2024-03-11 DIAGNOSIS — Z79899 Other long term (current) drug therapy: Secondary | ICD-10-CM

## 2024-03-11 DIAGNOSIS — I480 Paroxysmal atrial fibrillation: Secondary | ICD-10-CM

## 2024-03-11 DIAGNOSIS — R600 Localized edema: Secondary | ICD-10-CM | POA: Diagnosis not present

## 2024-03-11 DIAGNOSIS — Z1211 Encounter for screening for malignant neoplasm of colon: Secondary | ICD-10-CM

## 2024-03-11 LAB — LIPID PANEL
Chol/HDL Ratio: 4.1 ratio (ref 0.0–4.4)
Cholesterol, Total: 209 mg/dL — ABNORMAL HIGH (ref 100–199)
HDL: 51 mg/dL (ref >39)
LDL Chol Calc (NIH): 141 mg/dL — ABNORMAL HIGH (ref 0–99)
Triglycerides: 94 mg/dL (ref 0–149)
VLDL Cholesterol Cal: 17 mg/dL (ref 5–40)

## 2024-03-11 LAB — HEPATIC FUNCTION PANEL
ALT: 8 IU/L (ref 0–32)
AST: 16 IU/L (ref 0–40)
Albumin: 4.4 g/dL (ref 3.9–4.9)
Alkaline Phosphatase: 68 IU/L (ref 49–135)
Bilirubin Total: 0.2 mg/dL (ref 0.0–1.2)
Bilirubin, Direct: 0.08 mg/dL (ref 0.00–0.40)
Total Protein: 6.5 g/dL (ref 6.0–8.5)

## 2024-03-11 LAB — LDL CHOLESTEROL, DIRECT: LDL Direct: 138 mg/dL — ABNORMAL HIGH (ref 0–99)

## 2024-03-11 MED ORDER — LEVETIRACETAM 100 MG/ML PO SOLN: 1000.0000 mg | Freq: Two times a day (BID) | ORAL | 2 refills | Status: AC

## 2024-03-11 MED ORDER — FUROSEMIDE 20 MG PO TABS: 20.0000 mg | ORAL_TABLET | Freq: Every day | ORAL | 2 refills | Status: AC

## 2024-03-11 MED ORDER — ATORVASTATIN CALCIUM 40 MG PO TABS
40.0000 mg | ORAL_TABLET | Freq: Every day | ORAL | 3 refills | Status: DC
Start: 2024-03-11 — End: 2024-03-26

## 2024-03-11 MED ORDER — BUSPIRONE HCL 15 MG PO TABS: ORAL_TABLET | ORAL | 2 refills | Status: AC
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
## 2024-03-11 NOTE — Progress Notes (Signed)
 Subjective:   Christine Garcia is a 63 y.o. who presents for a Medicare Wellness preventive visit.  As a reminder, Annual Wellness Visits don't include a physical exam, and some assessments may be limited, especially if this visit is performed virtually. We may recommend an in-person follow-up visit with your provider if needed.  Visit Complete: Virtual I connected with  Christine Garcia on 03/11/24 by a audio enabled telemedicine application and verified that I am speaking with the correct person using two identifiers.  Patient Location: Home  Provider Location: Home Office  I discussed the limitations of evaluation and management by telemedicine. The patient expressed understanding and agreed to proceed.  Vital Signs: Because this visit was a virtual/telehealth visit, some criteria may be missing or patient reported. Any vitals not documented were not able to be obtained and vitals that have been documented are patient reported.  VideoDeclined- This patient declined Librarian, academic. Therefore the visit was completed with audio only.  Persons Participating in Visit: Patient.  AWV Questionnaire: No: Patient Medicare AWV questionnaire was not completed prior to this visit.  Cardiac Risk Factors include: advanced age (>48men, >40 women);dyslipidemia;hypertension     Objective:    Today's Vitals   03/11/24 1309 03/11/24 1310  Weight: 130 lb (59 kg)   Height: 5' 3 (1.6 m)   PainSc:  10-Worst pain ever   Body mass index is 23.03 kg/m.     03/11/2024    1:28 PM 08/23/2023    8:24 AM 08/30/2015    3:20 PM 04/19/2015    2:43 PM 01/11/2015    9:19 AM 01/02/2015    7:49 PM 09/02/2014    7:42 AM  Advanced Directives  Does Patient Have a Medical Advance Directive? Yes No No  No  No  No  No   Type of Advance Directive Living will        Does patient want to make changes to medical advance directive? No - Patient declined        Would patient like  information on creating a medical advance directive?  No - Patient declined No - patient declined information     No - patient declined information      Data saved with a previous flowsheet row definition    Current Medications (verified) Outpatient Encounter Medications as of 03/11/2024  Medication Sig   acetaminophen  (TYLENOL ) 500 MG tablet Take 3 tablets by mouth 2 (two) times daily.   atorvastatin (LIPITOR) 40 MG tablet Take 1 tablet (40 mg total) by mouth daily.   azelastine  (ASTELIN ) 0.1 % nasal spray Place 1 spray into both nostrils 2 (two) times daily. Use in each nostril as directed   busPIRone  (BUSPAR ) 15 MG tablet TAKE 1 TABLET BY MOUTH 2 TIMES DAILY.   DULoxetine  (CYMBALTA ) 60 MG capsule TAKE 1 CAPSULE BY MOUTH EVERY DAY   ELIQUIS  5 MG TABS tablet Take 1 tablet (5 mg total) by mouth 2 (two) times daily.   folic acid  (FOLVITE ) 1 MG tablet TAKE 1 TABLET BY MOUTH EVERY DAY   furosemide (LASIX) 20 MG tablet TAKE 1 TABLET BY MOUTH DAILY   gabapentin  (NEURONTIN ) 800 MG tablet Take 800 mg by mouth 3 (three) times daily.   lansoprazole  (PREVACID ) 30 MG capsule Take 1 capsule (30 mg total) by mouth 2 (two) times daily before a meal.   levETIRAcetam (KEPPRA) 100 MG/ML solution Take 10 mLs by mouth 2 (two) times daily.   metoprolol  succinate (TOPROL -XL) 25  MG 24 hr tablet Take 1 tablet (25 mg total) by mouth daily.   Multiple Vitamin (MULTIVITAMIN WITH MINERALS) TABS tablet Take 1 tablet by mouth daily.   traZODone  (DESYREL ) 50 MG tablet TAKE 1 TABLET BY MOUTH EVERYDAY AT BEDTIME   triamcinolone cream (KENALOG) 0.1 % SMARTSIG:1 sparingly Topical 2-3 Times Daily PRN   valproic acid (DEPAKENE) 250 MG capsule TAKE 2 CAPSULES BY MOUTH EVERY 12 HOURS   tetrahydrozoline 0.05 % ophthalmic solution Place 1 drop into both eyes daily as needed (Dry eye). (Patient not taking: Reported on 03/11/2024)   No facility-administered encounter medications on file as of 03/11/2024.    Allergies  (verified) Penicillins   History: Past Medical History:  Diagnosis Date   Anxiety    Atrial fibrillation (HCC)    Complication of anesthesia    states is hard to numb   Dupuytren's contracture of right hand 08/13/2014   ring/middle fingers   GERD (gastroesophageal reflux disease)    High triglycerides    History of atrial fibrillation    PAF with RVR 11/2011 & 09/2019   Hypertension    under control with med., has been on med. x 2 yr.   Migraines    occasional   Murmur    trivial AR, mild MR/TR 09/28/19 TTE   Neuropathy    Seasonal allergies    Seizures (HCC)    last seizure in 2022 per patient   Substance abuse (HCC)    pt reports ETOH abuse in past   Past Surgical History:  Procedure Laterality Date   CARDIAC CATHETERIZATION  07/14/2010   nonobstructive CAD   DILATION AND CURETTAGE OF UTERUS  2002   FASCIECTOMY Right 09/02/2014   Procedure: RIGHT HAND FASCIECTOMY RIGHT RING AND RIGHT MIDDLE FINGER ;  Surgeon: Arley Curia, MD;  Location: [facility];  Service: Orthopedics;  Laterality: Right;   ORIF HUMERUS FRACTURE Left 01/13/2015   Procedure: OPEN REDUCTION INTERNAL FIXATION (ORIF) LEFT PROXIMAL HUMERUS FRACTURE;  Surgeon: Franky Pointer, MD;  Location: [facility] OR;  Service: Orthopedics;  Laterality: Left;   ORIF HUMERUS FRACTURE Right 08/23/2023   Procedure: OPEN REDUCTION INTERNAL FIXATION (ORIF) HUMERAL SHAFT FRACTURE;  Surgeon: Kendal Franky SQUIBB, MD;  Location: [facility] OR;  Service: Orthopedics;  Laterality: Right;   Family History  Problem Relation Age of Onset   Stroke Mother    Heart attack Mother    Heart attack Father    Congestive Heart Failure Father    COPD Father    CVA Father    Heart attack Sister 23   Social History   Socioeconomic History   Marital status: Married    Spouse name: Donnice   Number of children: 1   Years of education: Not on file   Highest education level: 12th grade  Occupational History   Occupation: Control And Instrumentation Engineer:  [employer]   Occupation: disability  Tobacco Use   Smoking status: Never   Smokeless tobacco: Never  Vaping Use   Vaping status: Never Used  Substance and Sexual Activity   Alcohol use: Not Currently    Comment: hx ETOH use- has not drank in 68 days as of 08/21/23   Drug use: Yes    Frequency: 21.0 times per week    Types: Marijuana    Comment: medicated marijuana- TID   Sexual activity: Yes  Other Topics Concern   Not on file  Social History Narrative   Not on file   Social Drivers of Health  Financial Resource Strain: Low Risk  (03/11/2024)   Overall Financial Resource Strain (CARDIA)    Difficulty of Paying Living Expenses: Not hard at all  Food Insecurity: No Food Insecurity (03/11/2024)   Hunger Vital Sign    Worried About Running Out of Food in the Last Year: Never true    Ran Out of Food in the Last Year: Never true  Transportation Needs: No Transportation Needs (03/11/2024)   PRAPARE - Administrator, Civil Service (Medical): No    Lack of Transportation (Non-Medical): No  Physical Activity: Insufficiently Active (03/11/2024)   Exercise Vital Sign    Days of Exercise per Week: 3 days    Minutes of Exercise per Session: 30 min  Stress: No Stress Concern Present (03/11/2024)   [employer] of Occupational Health - Occupational Stress Questionnaire    Feeling of Stress: Only a little  Social Connections: Moderately Isolated (03/11/2024)   Social Connection and Isolation Panel    Frequency of Communication with Friends and Family: More than three times a week    Frequency of Social Gatherings with Friends and Family: Three times a week    Attends Religious Services: Never    Active Member of Clubs or Organizations: No    Attends Banker Meetings: Never    Marital Status: Married    Tobacco Counseling Counseling given: Not Answered    Clinical Intake:  Pre-visit preparation completed: Yes  Pain : 0-10 Pain Score: 10-Worst pain  ever Pain Type: Chronic pain, Neuropathic pain Pain Location: Foot Pain Orientation: Right, Left Pain Descriptors / Indicators: Burning     BMI - recorded: 23.03 Nutritional Status: BMI of 19-24  Normal Nutritional Risks: None Diabetes: No  Lab Results  Component Value Date   HGBA1C 5.2 08/13/2023     How often do you need to have someone help you when you read instructions, pamphlets, or other written materials from your doctor or pharmacy?: 1 - Never  Interpreter Needed?: No  Information entered by :: Vina Ned, CMA   Activities of Daily Living     03/11/2024    1:12 PM 08/23/2023    8:32 AM  In your present state of health, do you have any difficulty performing the following activities:  Hearing? 0   Vision? 0   Difficulty concentrating or making decisions? 0   Walking or climbing stairs? 1   Comment uses a cane   Dressing or bathing? 0   Doing errands, shopping? 1 0  Comment doesn't drive, family takes to appointments or Lyft rides   Preparing Food and eating ? N   Using the Toilet? N   In the past six months, have you accidently leaked urine? Y   Comment wears depends   Do you have problems with loss of bowel control? Y   Comment wears depends   Managing your Medications? N   Managing your Finances? N   Housekeeping or managing your Housekeeping? N     Patient Care Team: Sharma Coyer, MD as PCP - General (Family Medicine) Lorene Lesley LITTIE DEVONNA as Physician Assistant (Cardiology) End, Lonni, MD as Consulting Physician (Cardiology) Lane Arthea BRAVO, MD as Referring Physician (Neurology) Brendia Calton Buel DEVONNA (Endocrinology) Ulis Bottcher, PA-C as Physician Assistant (Neurosurgery) Haddix, Franky SQUIBB, MD as Consulting Physician (Orthopedic Surgery)  I have updated your Care Teams any recent Medical Services you may have received from other providers in the past year.     Assessment:   This is  a routine wellness  examination for [facility].  Hearing/Vision screen Hearing Screening - Comments:: Denies hearing loss  Vision Screening - Comments:: Needs routine eye exam. List of eye doctors included in AVS   Goals Addressed               This Visit's Progress     Patient Stated (pt-stated)        Get overall health better       Depression Screen     03/11/2024    1:26 PM 01/09/2024   10:53 AM 07/31/2023   10:35 AM 04/19/2015    2:48 PM  PHQ 2/9 Scores  PHQ - 2 Score 0 1 0 3  PHQ- 9 Score 0 2 4 13     Fall Risk     03/11/2024    1:29 PM 04/19/2015    2:48 PM  Fall Risk   Falls in the past year? 1 Yes   Number falls in past yr: 0 1   Injury with Fall? 0 No   Risk for fall due to : History of fall(s);Impaired balance/gait;Orthopedic patient   Follow up Falls evaluation completed;Education provided Falls evaluation completed      Data saved with a previous flowsheet row definition    MEDICARE RISK AT HOME:  Medicare Risk at Home Any stairs in or around the home?: No If so, are there any without handrails?: No Home free of loose throw rugs in walkways, pet beds, electrical cords, etc?: Yes Adequate lighting in your home to reduce risk of falls?: Yes Life alert?: No Use of a cane, walker or w/c?: No Grab bars in the bathroom?: Yes Shower chair or bench in shower?: Yes Elevated toilet seat or a handicapped toilet?: Yes  TIMED UP AND GO:  Was the test performed?  No  Cognitive Function: 6CIT completed        03/11/2024    1:31 PM  6CIT Screen  What Year? 0 points  What month? 0 points  What time? 0 points  Count back from 20 0 points  Months in reverse 0 points  Repeat phrase 0 points  Total Score 0 points    Immunizations Immunization History  Administered Date(s) Administered   Influenza, Seasonal, Injecte, Preservative Fre 01/09/2024   Influenza,inj,Quad PF,6+ Mos 02/01/2014   Moderna Covid-19 Vaccine Bivalent Booster 20yrs & up 05/02/2021   Moderna  Sars-Covid-2 Vaccination 07/28/2019, 08/25/2019   Pneumococcal Polysaccharide-23 11/03/2019   Td 05/14/2005   Tdap 02/01/2014    Screening Tests Health Maintenance  Topic Date Due   HIV Screening  Never done   Zoster Vaccines- Shingrix (1 of 2) Never done   Cervical Cancer Screening (HPV/Pap Cotest)  Never done   Mammogram  Never done   Fecal DNA (Cologuard)  Never done   Pneumococcal Vaccine: 50+ Years (2 of 2 - PCV) 11/02/2020   COVID-19 Vaccine (4 - 2025-26 season) 01/13/2024   DTaP/Tdap/Td (3 - Td or Tdap) 02/02/2024   Medicare Annual Wellness (AWV)  03/11/2025   DEXA SCAN  10/23/2025   Influenza Vaccine  Completed   Hepatitis C Screening  Completed   Hepatitis B Vaccines 19-59 Average Risk  Aged Out   HPV VACCINES  Aged Out   Meningococcal B Vaccine  Aged Out    Health Maintenance Items Addressed: Vaccines Due: pneumonia, Tdap and shingles, Cologuard Ordered, See Nurse Notes at the end of this note  Additional Screening:  Vision Screening: Recommended annual ophthalmology exams for early detection of glaucoma and  other disorders of the eye. Is the patient up to date with their annual eye exam?  No  Who is the provider or what is the name of the office in which the patient attends annual eye exams? Included a list of eye doctors in AVS  Dental Screening: Recommended annual dental exams for proper oral hygiene  Community Resource Referral / Chronic Care Management: CRR required this visit?  No   CCM required this visit?  No   Plan:    I have personally reviewed and noted the following in the patient's chart:   Medical and social history Use of alcohol, tobacco or illicit drugs  Current medications and supplements including opioid prescriptions. Patient is not currently taking opioid prescriptions. Functional ability and status Nutritional status Physical activity Advanced directives List of other physicians Hospitalizations, surgeries, and ER visits in  previous 12 months Vitals Screenings to include cognitive, depression, and falls Referrals and appointments  In addition, I have reviewed and discussed with patient certain preventive protocols, quality metrics, and best practice recommendations. A written personalized care plan for preventive services as well as general preventive health recommendations were provided to patient.   Vina Ned, CMA   03/11/2024   After Visit Summary: (MyChart) Due to this being a telephonic visit, the after visit summary with patients personalized plan was offered to patient via MyChart   Notes:  Declined colonoscopy, but agreed to do cologuard (no FHX of colon ca per patient, order placed) Gave ph# to call and schedule [facility] (order placed 07/31/23) Needs Pneumonia, Tdap and Shingles vaccines Needs routine eye visit. Included a list of eye doctors in AVS Had DEXA scan 10/24/23 showing osteoporosis (abstracted results) followed by [facility]

## 2024-03-11 NOTE — Patient Instructions (Signed)
 Ms. Christine Garcia,  Thank you for taking the time for your Medicare Wellness Visit. I appreciate your continued commitment to your health goals. Please review the care plan we discussed, and feel free to reach out if I can assist you further.  Medicare recommends these wellness visits once per year to help you and your care team stay ahead of potential health issues. These visits are designed to focus on prevention, allowing your provider to concentrate on managing your acute and chronic conditions during your regular appointments.  Please note that Annual Wellness Visits do not include a physical exam. Some assessments may be limited, especially if the visit was conducted virtually. If needed, we may recommend a separate in-person follow-up with your provider.  Ongoing Care Seeing your primary care provider every 3 to 6 months helps us  monitor your health and provide consistent, personalized care.   Referrals If a referral was made during today's visit and you haven't received any updates within two weeks, please contact the referred provider directly to check on the status.  Recommended Screenings:  Get the pneumonia, tetanus and shingles vaccines at your convenience.  An order has been placed for a Cologuard for you. They will mail you the kit with instructions on how to obtain the sample and send it back in to be tested. If you do not received your kit, please call our office and let us  know.    Please call to schedule your mammogram:  [facility] Address: [address], [place], [place] Phone: [phone]    Health Maintenance  Topic Date Due   HIV Screening  Never done   Zoster (Shingles) Vaccine (1 of 2) Never done   Pap with HPV screening  Never done   Breast Cancer Screening  Never done   Cologuard (Stool DNA test)  Never done   Pneumococcal Vaccine for age over 9 (2 of 2 - PCV) 11/02/2020   COVID-19 Vaccine (4 - 2025-26  season) 01/13/2024   DTaP/Tdap/Td vaccine (3 - Td or Tdap) 02/02/2024   Medicare Annual Wellness Visit  03/11/2025   DEXA scan (bone density measurement)  10/23/2025   Flu Shot  Completed   Hepatitis C Screening  Completed   Hepatitis B Vaccine  Aged Out   HPV Vaccine  Aged Out   Meningitis B Vaccine  Aged Out       03/11/2024    1:28 PM  Advanced Directives  Does Patient Have a Medical Advance Directive? Yes  Type of Advance Directive Living will  Does patient want to make changes to medical advance directive? No - Patient declined   Advance Care Planning is important because it: Ensures you receive medical care that aligns with your values, goals, and preferences. Provides guidance to your family and loved ones, reducing the emotional burden of decision-making during critical moments.  Vision: Annual vision screenings are recommended for early detection of glaucoma, cataracts, and diabetic retinopathy. These exams can also reveal signs of chronic conditions such as diabetes and high blood pressure.  There are several Eye Doctors in your area. Here are a few that usually accept all insurance types:  [facility] [address] [place], [place] [address] Phone: [phone]  [facility] [address] [place], [place] [address] Phone: [phone]  [facility] [address] [place], [place] [address] Phone: [phone]  [facility] [address] [place], [place] [address] Phone: [phone]  [facility] [address]  [place], [place] [address] Phone: [phone]  [facility] [address] [place], [place] [address] Phone: [phone]  Please let us  know if you require a referral for an eye exam appointment. Thank you!   Dental: Annual dental screenings help detect early signs of oral cancer, gum disease, and other conditions linked to overall health, including heart disease and diabetes.  Please see the attached documents for additional  preventive care recommendations.   Fall Prevention in the Home, Adult Falls can cause injuries and affect people of all ages. There are many simple things that you can do to make your home safe and to help prevent falls. If you need it, ask for help making these changes. What actions can I take to prevent falls? General information Use good lighting in all rooms. Make sure to: Replace any light bulbs that burn out. Turn on lights if it is dark and use night-lights. Keep items that you use often in easy-to-reach places. Lower the shelves around your home if needed. Move furniture so that there are clear paths around it. Do not keep throw rugs or other things on the floor that can make you trip. If any of your floors are uneven, fix them. Add color or contrast paint or tape to clearly mark and help you see: Grab bars or handrails. First and last steps of staircases. Where the edge of each step is. If you use a ladder or stepladder: Make sure that it is fully opened. Do not climb a closed ladder. Make sure the sides of the ladder are locked in place. Have someone hold the ladder while you use it. Know where your pets are as you move through your home. What can I do in the bathroom?     Keep the floor dry. Clean up any water that is on the floor right away. Remove soap buildup in the bathtub or shower. Buildup makes bathtubs and showers slippery. Use non-skid mats or decals on the floor of the bathtub or shower. Attach bath mats securely with double-sided, non-slip rug tape. If you need to sit down while you are in the shower, use a non-slip stool. Install grab bars by the toilet and in the bathtub and shower. Do not use towel bars as grab bars. What can I do in the bedroom? Make sure that you have a light by your bed that is easy to reach. Do not use any sheets or blankets on your bed that hang to the floor. Have a firm bench or chair with side arms that you can use for support when  you get dressed. What can I do in the kitchen? Clean up any spills right away. If you need to reach something above you, use a sturdy step stool that has a grab bar. Keep electrical cables out of the way. Do not use floor polish or wax that makes floors slippery. What can I do with my stairs? Do not leave anything on the stairs. Make sure that you have a light switch at the top and the bottom of the stairs. Have them installed if you do not have them. Make sure that there are handrails on both sides of the stairs. Fix handrails that are broken or loose. Make sure that handrails are as long as the staircases. Install non-slip stair treads on all stairs in your home if they do not have carpet. Avoid having throw rugs at the top or bottom of stairs, or secure the rugs with carpet  tape to prevent them from moving. Choose a carpet design that does not hide the edge of steps on the stairs. Make sure that carpet is firmly attached to the stairs. Fix any carpet that is loose or worn. What can I do on the outside of my home? Use bright outdoor lighting. Repair the edges of walkways and driveways and fix any cracks. Clear paths of anything that can make you trip, such as tools or rocks. Add color or contrast paint or tape to clearly mark and help you see high doorway thresholds. Trim any bushes or trees on the main path into your home. Check that handrails are securely fastened and in good repair. Both sides of all steps should have handrails. Install guardrails along the edges of any raised decks or porches. Have leaves, snow, and ice cleared regularly. Use sand, salt, or ice melt on walkways during winter months if you live where there is ice and snow. In the garage, clean up any spills right away, including grease or oil spills. What other actions can I take? Review your medicines with your health care provider. Some medicines can make you confused or feel dizzy. This can increase your chance of  falling. Wear closed-toe shoes that fit well and support your feet. Wear shoes that have rubber soles and low heels. Use a cane, walker, scooter, or crutches that help you move around if needed. Talk with your provider about other ways that you can decrease your risk of falls. This may include seeing a physical therapist to learn to do exercises to improve movement and strength. Where to find more information Centers for Disease Control and Prevention, STEADI: tonerpromos.no [employer] on Aging: baseringtones.pl National Institute on Aging: baseringtones.pl Contact a health care provider if: You are afraid of falling at home. You feel weak, drowsy, or dizzy at home. You fall at home. Get help right away if you: Lose consciousness or have trouble moving after a fall. Have a fall that causes a head injury. These symptoms may be an emergency. Get help right away. Call 911. Do not wait to see if the symptoms will go away. Do not drive yourself to the hospital. This information is not intended to replace advice given to you by your health care provider. Make sure you discuss any questions you have with your health care provider. Document Revised: 01/01/2022 Document Reviewed: 01/01/2022 Elsevier Patient Education  2024 [employer].

## 2024-03-11 NOTE — Patient Instructions (Signed)
 To keep you healthy, please keep in mind the following health maintenance items that you are due for:   Health Maintenance Due  Topic Date Due   HIV Screening  Never done   Zoster Vaccines- Shingrix (1 of 2) Never done   Cervical Cancer Screening (HPV/Pap Cotest)  Never done   Mammogram  Never done   Fecal DNA (Cologuard)  Never done   Pneumococcal Vaccine: 50+ Years (2 of 2 - PCV) 11/02/2020   COVID-19 Vaccine (4 - 2025-26 season) 01/13/2024   DTaP/Tdap/Td (3 - Td or Tdap) 02/02/2024     Best Wishes,   Dr. Lang

## 2024-03-12 ENCOUNTER — Other Ambulatory Visit: Payer: Self-pay | Admitting: Family Medicine

## 2024-03-12 DIAGNOSIS — I4891 Unspecified atrial fibrillation: Secondary | ICD-10-CM

## 2024-03-24 ENCOUNTER — Telehealth: Payer: Self-pay | Admitting: Internal Medicine

## 2024-03-24 NOTE — Telephone Encounter (Signed)
 Pt c/o medication issue:  1. Name of Medication:   atorvastatin (LIPITOR) 40 MG tablet    2. How are you currently taking this medication (dosage and times per day)? None   3. Are you having a reaction (difficulty breathing--STAT)? Severe headaches and nausea   4. What is your medication issue? Pt   Pt has been experiencing severe headaches and nausea after taking medication. Pt discontinued medication. Please advise.

## 2024-03-24 NOTE — Telephone Encounter (Signed)
 Pt returning nurse's call. Please advise

## 2024-03-24 NOTE — Telephone Encounter (Signed)
 Called patient. Left voicemail to return call to office.

## 2024-03-25 NOTE — Telephone Encounter (Signed)
 Patient called to check on status of her call from yesterday, and would like a callback.

## 2024-03-25 NOTE — Telephone Encounter (Signed)
 Called patient to let her know we have not heard back yet from [facility] in regards to medication question. Told patient we allow up to 48 hours for provider response and once we hear back with instructions we will get back to her.

## 2024-03-26 ENCOUNTER — Other Ambulatory Visit: Payer: Self-pay

## 2024-03-26 MED ORDER — PRAVASTATIN SODIUM 40 MG PO TABS: 40.0000 mg | ORAL_TABLET | Freq: Every evening | ORAL | 3 refills | Status: AC

## 2024-04-02 DIAGNOSIS — R208 Other disturbances of skin sensation: Secondary | ICD-10-CM | POA: Diagnosis not present

## 2024-04-02 DIAGNOSIS — R202 Paresthesia of skin: Secondary | ICD-10-CM | POA: Diagnosis not present

## 2024-04-02 DIAGNOSIS — G629 Polyneuropathy, unspecified: Secondary | ICD-10-CM | POA: Diagnosis not present

## 2024-04-02 DIAGNOSIS — R569 Unspecified convulsions: Secondary | ICD-10-CM | POA: Diagnosis not present

## 2024-04-02 DIAGNOSIS — M545 Low back pain, unspecified: Secondary | ICD-10-CM | POA: Diagnosis not present

## 2024-04-02 DIAGNOSIS — M720 Palmar fascial fibromatosis [Dupuytren]: Secondary | ICD-10-CM | POA: Diagnosis not present

## 2024-04-02 DIAGNOSIS — R200 Anesthesia of skin: Secondary | ICD-10-CM | POA: Diagnosis not present

## 2024-04-03 ENCOUNTER — Other Ambulatory Visit: Payer: Self-pay | Admitting: Family Medicine

## 2024-04-03 DIAGNOSIS — I4891 Unspecified atrial fibrillation: Secondary | ICD-10-CM

## 2024-04-03 MED ORDER — ELIQUIS 5 MG PO TABS: 5.0000 mg | ORAL_TABLET | Freq: Two times a day (BID) | ORAL | 1 refills | Status: AC

## 2024-04-08 ENCOUNTER — Institutional Professional Consult (permissible substitution): Admitting: Cardiology

## 2024-04-12 ENCOUNTER — Other Ambulatory Visit: Payer: Self-pay | Admitting: Family Medicine

## 2024-04-12 DIAGNOSIS — F3342 Major depressive disorder, recurrent, in full remission: Secondary | ICD-10-CM

## 2024-04-16 DIAGNOSIS — Z9189 Other specified personal risk factors, not elsewhere classified: Secondary | ICD-10-CM | POA: Diagnosis not present

## 2024-04-16 DIAGNOSIS — M810 Age-related osteoporosis without current pathological fracture: Secondary | ICD-10-CM | POA: Diagnosis not present

## 2024-04-21 ENCOUNTER — Ambulatory Visit: Admitting: Cardiology

## 2024-04-30 ENCOUNTER — Other Ambulatory Visit: Payer: Self-pay

## 2024-04-30 DIAGNOSIS — Z1231 Encounter for screening mammogram for malignant neoplasm of breast: Secondary | ICD-10-CM

## 2024-05-10 ENCOUNTER — Other Ambulatory Visit: Payer: Self-pay | Admitting: Family Medicine

## 2024-05-10 DIAGNOSIS — F3342 Major depressive disorder, recurrent, in full remission: Secondary | ICD-10-CM

## 2024-05-26 ENCOUNTER — Ambulatory Visit: Attending: Cardiology | Admitting: Cardiology

## 2024-05-26 ENCOUNTER — Other Ambulatory Visit: Payer: Self-pay

## 2024-05-26 VITALS — BP 120/70 | HR 70 | Ht 62.0 in | Wt 134.8 lb

## 2024-05-26 DIAGNOSIS — D6869 Other thrombophilia: Secondary | ICD-10-CM

## 2024-05-26 DIAGNOSIS — I4811 Longstanding persistent atrial fibrillation: Secondary | ICD-10-CM

## 2024-05-26 DIAGNOSIS — I10 Essential (primary) hypertension: Secondary | ICD-10-CM

## 2024-05-26 DIAGNOSIS — I4819 Other persistent atrial fibrillation: Secondary | ICD-10-CM

## 2024-05-26 NOTE — Progress Notes (Signed)
 " Electrophysiology Office Note:   Date:  05/26/2024  ID:  Christine Garcia, DOB 08/27/1960, MRN [id number]  Primary Cardiologist: None Electrophysiologist: Fonda Kitty, MD      History of Present Illness:   Christine Garcia is a 64 y.o. female with h/o persistent atrial fibrillation, nonobstructive CAD, hypertension, hyperlipidemia, seizure disorder, neuropathy, and alcohol abuse who is being seen today for evaluation of her atrial fibrillation.  Discussed the use of AI scribe software for clinical note transcription with the patient, who gave verbal consent to proceed.  History of Present Illness Christine Garcia is a 64 year old female with atrial fibrillation who presents for evaluation of her condition. She is accompanied by her husband.  She has experienced atrial fibrillation for several years. She experiences exertional shortness of breath and fatigue, which she is concerned could be related to her atrial fibrillation.  Her medical history includes a seizure in 2021, during which she was in atrial fibrillation. She recalls an episode in 2016 when she was in atrial fibrillation while working at [employer] [employer] [facility] [employer] [employer], confirmed by a heart doctor who was a paramedic. She underwent arm surgery in [place]  in 2024 while in atrial fibrillation, initially delayed by the heart doctor there.  She has been on Eliquis  to minimize stroke risk and reports no bleeding issues. She also takes metoprolol .  She is otherwise doing relatively well. No new or acute complaints today.   Review of systems complete and found to be negative unless listed in HPI.   EP Information / Studies Reviewed:    EKG is not ordered today. EKG from 03/10/24 reviewed which showed AF      Echo 12/05/23:   1. Left ventricular ejection fraction, by estimation, is 60 to 65%. The  left ventricle has normal function. The left ventricle has no regional  wall motion abnormalities. There is moderate asymmetric left ventricular   hypertrophy of the basal-septal  segment. Left ventricular diastolic parameters are indeterminate.   2. Right ventricular systolic function is normal. The right ventricular  size is normal. There is normal pulmonary artery systolic pressure. The  estimated right ventricular systolic pressure is 31.8 mmHg.   3. Left atrial size was severely dilated.   4. The mitral valve is normal in structure. Mild mitral valve  regurgitation. No evidence of mitral stenosis.   5. Tricuspid valve regurgitation is mild to moderate.   6. The aortic valve is normal in structure. Aortic valve regurgitation is  not visualized. No aortic stenosis is present.   7. The inferior vena cava is normal in size with greater than 50%  respiratory variability, suggesting right atrial pressure of 3 mmHg.    Risk Assessment/Calculations:    CHA2DS2-VASc Score = 3   This indicates a 3.2% annual risk of stroke. The patient's score is based upon: CHF History: 0 HTN History: 1 Diabetes History: 0 Stroke History: 0 Vascular Disease History: 1 Age Score: 0 Gender Score: 1             Physical Exam:   VS:  BP 120/70 (BP Location: Left Arm, Patient Position: Sitting, Cuff Size: Normal)   Pulse 70   Ht 5' 2 (1.575 m)   Wt 134 lb 12.8 oz (61.1 kg)   LMP 09/15/2011   SpO2 99%   BMI 24.66 kg/m    Wt Readings from Last 3 Encounters:  05/26/24 134 lb 12.8 oz (61.1 kg)  03/11/24 130 lb (59 kg)  03/10/24 130 lb 3.2 oz (59.1  kg)     GEN: Well nourished, well developed in no acute distress NECK: No JVD CARDIAC: Normal rate, irregular RESPIRATORY:  Clear to auscultation without rales, wheezing or rhonchi  ABDOMEN: Soft, non-distended EXTREMITIES:  No edema; No deformity   ASSESSMENT AND PLAN:    # Longstanding persistent atrial fibrillation: Likely symptomatic although difficult to assess due to longstanding nature of her atrial fibrillation. Unclear at what point she progressed from paroxysmal to persistent.   There are multiple ECGs dating back to 2021 which demonstrated atrial fibrillation.  There appear to have been no efforts to pursue rhythm control previously. Discussed that success of rhythm control at this point is unfortunately low, but that progression to permanent atrial fibrillation does carry increased risk of stroke, dementia and heart failure.  For this reason, we we will trial cardioversion.  If she is able to maintain sinus rhythm for any significant period of time, then we can discuss escalation to antiarrhythmic therapy (Tikosyn or amiodarone) or catheter ablation, potentially both.  If she has immediate return of atrial fibrillation or unsuccessful cardioversion then we will commit her to permanent atrial fibrillation and rate control. # Hypercoagulable state due to AF:  -Continue Eliquis  5 mg twice daily. -Continue metoprolol  XL 25 mg daily.  #Hypertension -At goal today.  Recommend checking blood pressures 1-2 times per week at home and recording the values.  Recommend bringing these recordings to the primary care physician.  Follow up with EP Team in 4 weeks  Signed, Fonda Kitty, MD  "

## 2024-05-26 NOTE — Patient Instructions (Signed)
 Medication Instructions:  Your physician recommends that you continue on your current medications as directed. Please refer to the Current Medication list given to you today.  *If you need a refill on your cardiac medications before your next appointment, please call your pharmacy*  Lab Work: TODAY: BMET and CBC  Testing/Procedures: Cardioversion Your physician has recommended that you have a Cardioversion (DCCV). Electrical Cardioversion uses a jolt of electricity to your heart either through paddles or wired patches attached to your chest. This is a controlled, usually prescheduled, procedure. Defibrillation is done under light anesthesia in the hospital, and you usually go home the day of the procedure. This is done to get your heart back into a normal rhythm. You are not awake for the procedure. Please see the instruction sheet given to you today.   Follow-Up: At [facility], you and your health needs are our priority.  As part of our continuing mission to provide you with exceptional heart care, our providers are all part of one team.  This team includes your primary Cardiologist (physician) and Advanced Practice Providers or APPs (Physician Assistants and Nurse Practitioners) who all work together to provide you with the care you need, when you need it.  Your next appointment:   2 weeks after your cardioversion  Provider:   Suzann Riddle, NP

## 2024-05-26 NOTE — H&P (View-Only) (Signed)
 " Electrophysiology Office Note:   Date:  05/26/2024  ID:  Christine Garcia, DOB 08/27/1960, MRN [id number]  Primary Cardiologist: None Electrophysiologist: Fonda Kitty, MD      History of Present Illness:   Christine Garcia is a 64 y.o. female with h/o persistent atrial fibrillation, nonobstructive CAD, hypertension, hyperlipidemia, seizure disorder, neuropathy, and alcohol abuse who is being seen today for evaluation of her atrial fibrillation.  Discussed the use of AI scribe software for clinical note transcription with the patient, who gave verbal consent to proceed.  History of Present Illness Christine Garcia is a 64 year old female with atrial fibrillation who presents for evaluation of her condition. She is accompanied by her husband.  She has experienced atrial fibrillation for several years. She experiences exertional shortness of breath and fatigue, which she is concerned could be related to her atrial fibrillation.  Her medical history includes a seizure in 2021, during which she was in atrial fibrillation. She recalls an episode in 2016 when she was in atrial fibrillation while working at [employer] [facility] [employer] [facility] [employer] [employer], confirmed by a heart doctor who was a paramedic. She underwent arm surgery in [place]  in 2024 while in atrial fibrillation, initially delayed by the heart doctor there.  She has been on Eliquis  to minimize stroke risk and reports no bleeding issues. She also takes metoprolol .  She is otherwise doing relatively well. No new or acute complaints today.   Review of systems complete and found to be negative unless listed in HPI.   EP Information / Studies Reviewed:    EKG is not ordered today. EKG from 03/10/24 reviewed which showed AF      Echo 12/05/23:   1. Left ventricular ejection fraction, by estimation, is 60 to 65%. The  left ventricle has normal function. The left ventricle has no regional  wall motion abnormalities. There is moderate asymmetric left ventricular   hypertrophy of the basal-septal  segment. Left ventricular diastolic parameters are indeterminate.   2. Right ventricular systolic function is normal. The right ventricular  size is normal. There is normal pulmonary artery systolic pressure. The  estimated right ventricular systolic pressure is 31.8 mmHg.   3. Left atrial size was severely dilated.   4. The mitral valve is normal in structure. Mild mitral valve  regurgitation. No evidence of mitral stenosis.   5. Tricuspid valve regurgitation is mild to moderate.   6. The aortic valve is normal in structure. Aortic valve regurgitation is  not visualized. No aortic stenosis is present.   7. The inferior vena cava is normal in size with greater than 50%  respiratory variability, suggesting right atrial pressure of 3 mmHg.    Risk Assessment/Calculations:    CHA2DS2-VASc Score = 3   This indicates a 3.2% annual risk of stroke. The patient's score is based upon: CHF History: 0 HTN History: 1 Diabetes History: 0 Stroke History: 0 Vascular Disease History: 1 Age Score: 0 Gender Score: 1             Physical Exam:   VS:  BP 120/70 (BP Location: Left Arm, Patient Position: Sitting, Cuff Size: Normal)   Pulse 70   Ht 5' 2 (1.575 m)   Wt 134 lb 12.8 oz (61.1 kg)   LMP 09/15/2011   SpO2 99%   BMI 24.66 kg/m    Wt Readings from Last 3 Encounters:  05/26/24 134 lb 12.8 oz (61.1 kg)  03/11/24 130 lb (59 kg)  03/10/24 130 lb 3.2 oz (59.1  kg)     GEN: Well nourished, well developed in no acute distress NECK: No JVD CARDIAC: Normal rate, irregular RESPIRATORY:  Clear to auscultation without rales, wheezing or rhonchi  ABDOMEN: Soft, non-distended EXTREMITIES:  No edema; No deformity   ASSESSMENT AND PLAN:    # Longstanding persistent atrial fibrillation: Likely symptomatic although difficult to assess due to longstanding nature of her atrial fibrillation. Unclear at what point she progressed from paroxysmal to persistent.   There are multiple ECGs dating back to 2021 which demonstrated atrial fibrillation.  There appear to have been no efforts to pursue rhythm control previously. Discussed that success of rhythm control at this point is unfortunately low, but that progression to permanent atrial fibrillation does carry increased risk of stroke, dementia and heart failure.  For this reason, we we will trial cardioversion.  If she is able to maintain sinus rhythm for any significant period of time, then we can discuss escalation to antiarrhythmic therapy (Tikosyn or amiodarone) or catheter ablation, potentially both.  If she has immediate return of atrial fibrillation or unsuccessful cardioversion then we will commit her to permanent atrial fibrillation and rate control. # Hypercoagulable state due to AF:  -Continue Eliquis  5 mg twice daily. -Continue metoprolol  XL 25 mg daily.  #Hypertension -At goal today.  Recommend checking blood pressures 1-2 times per week at home and recording the values.  Recommend bringing these recordings to the primary care physician.  Follow up with EP Team in 4 weeks  Signed, Fonda Kitty, MD  "

## 2024-05-28 ENCOUNTER — Telehealth: Payer: Self-pay | Admitting: Cardiovascular Disease

## 2024-05-28 NOTE — Telephone Encounter (Signed)
 Patient would like to know if 1/30 appointment needs to be rescheduled for after 2/06 procedure with Dr. Gollan. Please advise.

## 2024-06-10 ENCOUNTER — Ambulatory Visit: Admitting: Physician Assistant

## 2024-06-12 ENCOUNTER — Ambulatory Visit: Admitting: Physician Assistant

## 2024-06-16 ENCOUNTER — Telehealth: Payer: Self-pay | Admitting: Cardiology

## 2024-06-17 NOTE — Telephone Encounter (Signed)
 Spoke with the patient who states that she hasn't had her blood work done for her cardioversion on Friday so she needs to cancel. I advised that they can always draw her blood the morning of if that is the only reason for rescheduling. She states that she is fine to keep as scheduled and have her blood work done at the hospital. She is going to make sure this is still okay with her husband as he will need to take her and they are currently in [place]  for his work. Will plan to keep as scheduled unless she calls back.

## 2024-06-19 ENCOUNTER — Ambulatory Visit: Admitting: Anesthesiology

## 2024-06-19 ENCOUNTER — Encounter: Admission: RE | Payer: Self-pay | Source: Home / Self Care

## 2024-06-19 ENCOUNTER — Encounter
Admission: RE | Disposition: A | Discharge status: Home / Self Care | Payer: Self-pay | Source: Home / Self Care | Attending: Cardiology

## 2024-06-19 ENCOUNTER — Ambulatory Visit
Admission: RE | Admit: 2024-06-19 | Discharge: 2024-06-19 | Disposition: A | Source: Home / Self Care | Attending: Cardiology | Admitting: Cardiology

## 2024-06-19 ENCOUNTER — Ambulatory Visit: Admit: 2024-06-19 | Admitting: Cardiology

## 2024-06-19 ENCOUNTER — Ambulatory Visit: Admitting: Family Medicine

## 2024-06-19 ENCOUNTER — Encounter: Payer: Self-pay | Admitting: Cardiology

## 2024-06-19 DIAGNOSIS — Z01818 Encounter for other preprocedural examination: Secondary | ICD-10-CM

## 2024-06-19 MED ORDER — FENTANYL CITRATE (PF) 100 MCG/2ML IJ SOLN: 25.0000 ug | INTRAMUSCULAR | Status: DC | PRN

## 2024-06-19 MED ORDER — PROPOFOL 10 MG/ML IV BOLUS
INTRAVENOUS | Status: AC
  Filled 2024-06-19: qty 20

## 2024-06-19 MED ORDER — ONDANSETRON HCL 4 MG/2ML IJ SOLN: 4.0000 mg | Freq: Once | INTRAMUSCULAR | Status: DC | PRN

## 2024-06-19 MED ORDER — EPHEDRINE 5 MG/ML INJ
INTRAVENOUS | Status: AC
  Filled 2024-06-19: qty 5

## 2024-06-19 MED ORDER — SODIUM CHLORIDE 0.9 % IV SOLN: INTRAVENOUS | Status: DC

## 2024-06-19 MED ORDER — OXYCODONE HCL 5 MG PO TABS: 5.0000 mg | ORAL_TABLET | Freq: Once | ORAL | Status: DC | PRN

## 2024-06-19 MED ORDER — PROPOFOL 10 MG/ML IV BOLUS
INTRAVENOUS | Status: DC | PRN
  Administered 2024-06-19: 60 mg via INTRAVENOUS

## 2024-06-19 MED ORDER — OXYCODONE HCL 5 MG/5ML PO SOLN: 5.0000 mg | Freq: Once | ORAL | Status: DC | PRN

## 2024-06-19 MED ORDER — LIDOCAINE HCL (PF) 2 % IJ SOLN
INTRAMUSCULAR | Status: AC
  Filled 2024-06-19: qty 5

## 2024-06-19 MED ORDER — ACETAMINOPHEN 10 MG/ML IV SOLN: 1000.0000 mg | Freq: Once | INTRAVENOUS | Status: DC | PRN

## 2024-06-19 MED ORDER — GLYCOPYRROLATE 0.2 MG/ML IJ SOLN
INTRAMUSCULAR | Status: AC
  Filled 2024-06-19: qty 1

## 2024-06-19 MED ORDER — PROPOFOL 10 MG/ML IV BOLUS
INTRAVENOUS | Status: DC | PRN
  Administered 2024-06-19: 50 mg via INTRAVENOUS

## 2024-06-19 NOTE — Interval H&P Note (Signed)
 History and Physical Interval Note:  06/19/2024 7:43 AM  Christine Garcia  has presented today for surgery, with the diagnosis of persistent  Afib.  The various methods of treatment have been discussed with the patient and family. After consideration of risks, benefits and other options for treatment, the patient has consented to  Procedures: CARDIOVERSION (N/A) as a surgical intervention.  The patient's history has been reviewed, patient examined, no change in status, stable for surgery.  I have reviewed the patient's chart and labs.  Questions were answered to the patient's satisfaction.     [name]

## 2024-06-19 NOTE — Procedures (Signed)
 Cardioversion procedure note For atrial fibrillation.  Procedure Details:  Consent: Risks of procedure as well as the alternatives and risks of each were explained to the (patient/caregiver).  Consent for procedure obtained.  Time Out: Verified patient identification, verified procedure, site/side was marked, verified correct patient position, special equipment/implants available, medications/allergies/relevent history reviewed, required imaging and test results available.  Performed  Patient placed on cardiac monitor, pulse oximetry, supplemental oxygen as necessary.   Sedation given: propofol  IV, Dr.  Rosena pads placed anterior and posterior chest.   Cardioverted 1 time(s).   Cardioverted at  150J. Synchronized biphasic Converted to NSR   Evaluation: Findings: Post procedure EKG shows: NSR Complications: None Patient did tolerate procedure well.   Redell Cave, M.D.

## 2024-06-19 NOTE — Anesthesia Preprocedure Evaluation (Signed)
"                                    Anesthesia Evaluation  Patient identified by MRN, date of birth, ID band Patient awake    Reviewed: Allergy & Precautions, H&P , NPO status , Patient's Chart, lab work & pertinent test results  Airway Mallampati: II  TM Distance: >3 FB Neck ROM: Full    Dental no notable dental hx.    Pulmonary neg pulmonary ROS   Pulmonary exam normal breath sounds clear to auscultation       Cardiovascular hypertension, negative cardio ROS Normal cardiovascular exam Rhythm:Regular Rate:Normal     Neuro/Psych negative neurological ROS  negative psych ROS   GI/Hepatic negative GI ROS, Neg liver ROS,,,  Endo/Other  negative endocrine ROS    Renal/GU negative Renal ROS  negative genitourinary   Musculoskeletal negative musculoskeletal ROS (+)    Abdominal   Peds negative pediatric ROS (+)  Hematology negative hematology ROS (+)   Anesthesia Other Findings   Reproductive/Obstetrics negative OB ROS                              Anesthesia Physical Anesthesia Plan  ASA: 3  Anesthesia Plan: General   Post-op Pain Management:    Induction: Intravenous  PONV Risk Score and Plan:   Airway Management Planned:   Additional Equipment:   Intra-op Plan:   Post-operative Plan: Extubation in OR  Informed Consent: I have reviewed the patients History and Physical, chart, labs and discussed the procedure including the risks, benefits and alternatives for the proposed anesthesia with the patient or authorized representative who has indicated his/her understanding and acceptance.     Dental advisory given  Plan Discussed with: CRNA  Anesthesia Plan Comments:         Anesthesia Quick Evaluation  "

## 2024-06-19 NOTE — Transfer of Care (Signed)
 Immediate Anesthesia Transfer of Care Note  Patient: Christine Garcia  Procedure(s) Performed: CARDIOVERSION  Patient Location: PACU  Anesthesia Type:MAC  Level of Consciousness: sedated  Airway & Oxygen Therapy: Patient Spontanous Breathing and Patient connected to nasal cannula oxygen  Post-op Assessment: Report given to RN and Post -op Vital signs reviewed and stable  Post vital signs: stable  Last Vitals:  Vitals Value Taken Time  BP 141/85 06/19/24 09:50  Temp    Pulse 66 06/19/24 09:54  Resp 11 06/19/24 09:54  SpO2 96 % 06/19/24 09:54  Vitals shown include unfiled device data.  Last Pain:  Vitals:   06/19/24 0900  TempSrc:   PainSc: 0-No pain         Complications: No notable events documented.

## 2024-06-19 NOTE — Procedures (Signed)
 Cardioversion procedure note For atrial fibrillation/atrial flutter.  Procedure Details:  Patient had a DCCV successfully for atrial fibrillation earlier today at about 741am. EKG/tele prior to discharge about an hour later showed atrial flutter. Another DC cardioversion scheduled.  Consent: Risks of procedure as well as the alternatives and risks of each were explained to the (patient/caregiver).  Consent for procedure obtained.  Time Out: Verified patient identification, verified procedure, site/side was marked, verified correct patient position, special equipment/implants available, medications/allergies/relevent history reviewed, required imaging and test results available.  Performed  Patient placed on cardiac monitor, pulse oximetry, supplemental oxygen as necessary.   Sedation given: propofol  IV per anesthesia team. Pacer pads placed anterior and posterior chest.  Cardioverted 1 time(s).   Cardioverted at  150J. Synchronized biphasic Converted to NSR  Evaluation: Findings: Post procedure EKG shows: NSR Complications: None Patient did tolerate procedure well. F/u with EP as outpatient   Redell Cave, M.D.

## 2024-06-19 NOTE — Transfer of Care (Signed)
 Immediate Anesthesia Transfer of Care Note  Patient: Christine Garcia  Procedure(s) Performed: CARDIOVERSION  Patient Location: PACU  Anesthesia Type:General  Level of Consciousness: awake and alert   Airway & Oxygen Therapy: Patient Spontanous Breathing and Patient connected to nasal cannula oxygen  Post-op Assessment: Report given to RN and Post -op Vital signs reviewed and stable  Post vital signs: stable  Last Vitals:  Vitals Value Taken Time  BP 121/99 06/19/24 07:45  Temp    Pulse 58 06/19/24 07:47  Resp 24 06/19/24 07:47  SpO2 97 % 06/19/24 07:47    Last Pain:  Vitals:   06/19/24 0656  TempSrc: Temporal  PainSc: 0-No pain         Complications: No notable events documented.

## 2024-06-19 NOTE — Anesthesia Postprocedure Evaluation (Signed)
"   Anesthesia Post Note  Patient: Christine Garcia  Procedure(s) Performed: CARDIOVERSION  Patient location during evaluation: PACU Anesthesia Type: General Level of consciousness: awake and alert Pain management: pain level controlled Vital Signs Assessment: post-procedure vital signs reviewed and stable Respiratory status: spontaneous breathing, nonlabored ventilation, respiratory function stable and patient connected to nasal cannula oxygen Cardiovascular status: blood pressure returned to baseline and stable Postop Assessment: no apparent nausea or vomiting Anesthetic complications: no   No notable events documented.   Last Vitals:  Vitals:   06/19/24 1000 06/19/24 1030  BP: 110/78 (!) 130/90  Pulse: 65 69  Resp: (!) 22 14  Temp: (!) 36.3 C   SpO2: 98% 97%    Last Pain:  Vitals:   06/19/24 1030  TempSrc:   PainSc: 0-No pain                 Fairy A Mistina Coatney      "

## 2024-06-19 NOTE — Interval H&P Note (Signed)
 History and Physical Interval Note:  06/19/2024 9:49 AM  Christine Garcia  has presented today for surgery, with the diagnosis of atrial flutter.  The various methods of treatment have been discussed with the patient and family. After consideration of risks, benefits and other options for treatment, the patient has consented to  Procedures: CARDIOVERSION (N/A) as a surgical intervention.  The patient's history has been reviewed, patient examined, no change in status, stable for surgery.  I have reviewed the patient's chart and labs.  Questions were answered to the patient's satisfaction.     [name]

## 2024-06-23 ENCOUNTER — Ambulatory Visit: Admitting: Family Medicine

## 2024-07-03 ENCOUNTER — Ambulatory Visit: Admitting: Physician Assistant

## 2024-07-03 ENCOUNTER — Ambulatory Visit: Admitting: Cardiology

## 2024-09-16 ENCOUNTER — Ambulatory Visit: Admitting: Family Medicine

## 2024-09-25 ENCOUNTER — Ambulatory Visit: Admitting: Family Medicine

## 2025-03-17 ENCOUNTER — Ambulatory Visit
